# Patient Record
Sex: Male | Born: 1970
Health system: Southern US, Community
[De-identification: ages and names within clinical notes are randomized; demographics above are authoritative.]

## PROBLEM LIST (undated history)

## (undated) DIAGNOSIS — E119 Type 2 diabetes mellitus without complications: Secondary | ICD-10-CM

## (undated) DIAGNOSIS — E785 Hyperlipidemia, unspecified: Secondary | ICD-10-CM

## (undated) DIAGNOSIS — H11003 Unspecified pterygium of eye, bilateral: Secondary | ICD-10-CM

## (undated) DIAGNOSIS — H11009 Unspecified pterygium of unspecified eye: Secondary | ICD-10-CM

## (undated) DIAGNOSIS — I10 Essential (primary) hypertension: Secondary | ICD-10-CM

## (undated) DIAGNOSIS — K219 Gastro-esophageal reflux disease without esophagitis: Secondary | ICD-10-CM

## (undated) HISTORY — PX: BACK SURGERY: SHX140

## (undated) HISTORY — PX: PTERYGIUM EXCISION: SHX2273

## (undated) HISTORY — PX: APPENDECTOMY: SHX54

## (undated) HISTORY — DX: Gastro-esophageal reflux disease without esophagitis: K21.9

## (undated) HISTORY — DX: Type 2 diabetes mellitus without complications: E11.9

## (undated) HISTORY — DX: Hyperlipidemia, unspecified: E78.5

## (undated) HISTORY — DX: Unspecified pterygium of eye, bilateral: H11.003

## (undated) HISTORY — DX: Essential (primary) hypertension: I10

---

## 2004-08-21 HISTORY — PX: EYE SURGERY: SHX253

## 2009-08-30 ENCOUNTER — Emergency Department (HOSPITAL_BASED_OUTPATIENT_CLINIC_OR_DEPARTMENT_OTHER): Admission: EM | Admit: 2009-08-30 | Discharge: 2009-08-30 | Payer: Self-pay | Admitting: Emergency Medicine

## 2009-08-30 ENCOUNTER — Ambulatory Visit: Payer: Self-pay | Admitting: Diagnostic Radiology

## 2012-12-09 ENCOUNTER — Emergency Department (HOSPITAL_COMMUNITY)
Admission: EM | Admit: 2012-12-09 | Discharge: 2012-12-09 | Disposition: A | Discharge status: Home / Self Care | Payer: No Typology Code available for payment source | Source: Home / Self Care

## 2012-12-09 ENCOUNTER — Encounter (HOSPITAL_COMMUNITY): Payer: Self-pay | Admitting: *Deleted

## 2012-12-09 DIAGNOSIS — H11009 Unspecified pterygium of unspecified eye: Secondary | ICD-10-CM

## 2012-12-09 DIAGNOSIS — H11003 Unspecified pterygium of eye, bilateral: Secondary | ICD-10-CM

## 2012-12-09 NOTE — ED Provider Notes (Signed)
History     CSN: [id number]  Arrival date & time 12/09/12  1717   First MD Initiated Contact with Patient 12/09/12 1826      Chief Complaint  Patient presents with  . Establish Care     HPI 42 year old male with no significant past medical history here for symptoms of blurry vision on both his eye mainly on left lateral gaze. He informs occasional pain over his right lateral eyes well. He informs the symptoms to be  ongoing for several months. Informs  having a surgery to his right eye for similar symptoms several years back. He denies any headache, dizziness, chest pain, palpitations or shortness of breath, abdominal pain, nausea, vomiting, bowel or urinary symptoms   History reviewed. No pertinent past medical history.  Past Surgical History  Procedure Laterality Date  . Eye surgery      No family history on file.  History  Substance Use Topics  . Smoking status: Former Smoker    Quit date: 12/10/1998  . Smokeless tobacco: Not on file  . Alcohol Use: No      Review of Systems As outlined in history of present illness Allergies  Review of patient's allergies indicates no known allergies.  Home Medications  No current outpatient prescriptions on file.  BP 128/74  Pulse 67  Temp(Src) 98.6 F (37 C) (Oral)  Resp 14  SpO2 98%  Physical Exam We did aged male in no acute distress HEENT: No pallor, moist oral mucosa Chest: Clear to auscultation bilaterally, no added sounds CVS: Normal S1 and S2, no murmurs or gallop Abdomen: Soft, nontender, nondistended, bowel sounds present Images: Warm, no edema CNS: AAO x3, bilateral pterygium mainly over right lateal encroaching into the right lateral pupil , normal EOMI, pupils equal b/l. Visual acquity normal.  ED Course  Procedures (including critical care time)  Labs Reviewed - No data to display No results found.   1. Pterygium eye, bilateral    Referral to ophthalmology clinic for evaluation and possible  need for surgery. If has worsening pain with visual changes needs to go  to ED immidiately   MDM          Eddie North, MD 12/09/12 [id number]

## 2012-12-09 NOTE — ED Notes (Signed)
Patient presents to establish care; also states he has been having redness in eyes due to allergy; also blurry vision in right eye. States has had eye surgery in the past.

## 2013-05-26 ENCOUNTER — Encounter: Payer: Self-pay | Admitting: Internal Medicine

## 2013-05-26 ENCOUNTER — Ambulatory Visit: Payer: No Typology Code available for payment source | Attending: Internal Medicine

## 2013-05-26 ENCOUNTER — Ambulatory Visit: Payer: No Typology Code available for payment source | Attending: Internal Medicine | Admitting: Internal Medicine

## 2013-05-26 VITALS — BP 119/75 | HR 59 | Temp 98.7°F | Resp 16 | Ht 69.0 in | Wt 208.0 lb

## 2013-05-26 DIAGNOSIS — H11003 Unspecified pterygium of eye, bilateral: Secondary | ICD-10-CM | POA: Insufficient documentation

## 2013-05-26 DIAGNOSIS — H11009 Unspecified pterygium of unspecified eye: Secondary | ICD-10-CM | POA: Insufficient documentation

## 2013-05-26 MED ORDER — NAPHAZOLINE-PHENIRAMINE 0.025-0.3 % OP SOLN: 1.0000 [drp] | Freq: Four times a day (QID) | OPHTHALMIC | Status: DC | PRN

## 2013-05-26 MED ORDER — LOTEPREDNOL ETABONATE 0.5 % OP SUSP: 1.0000 [drp] | Freq: Four times a day (QID) | OPHTHALMIC | Status: DC

## 2013-05-26 NOTE — Patient Instructions (Signed)
Pterygium (Surfer's Eye) and Pinguecula Save This Article For Later  Share this:  Pterygium (pronounced tur-IJ-ee-um) is a common eye condition that affects people who spend a lot of time outdoors. Because it often affects surfers, it is also known as surfer's eye. It can affect anyone, though, even children who don't wear sunglasses outside. People with pterygium have a growth of pink, fleshy tissue on the white of the eye. The growth usually forms on the side closest to the nose and grows toward the center of the eye. Recommended Related to [facility] Cornea Transplant During a cornea transplant, an eye surgeon removes a portion of your cornea and replaces it with a new section of cornea from a donor. The procedure is also called a corneal transplant or a keratoplasty. About 40,000 cornea transplants are performed in the [place] every year. You may need a cornea transplant if your cornea no longer lets light enter your eye properly because of scarring or disease.  Read the Cornea Transplant article > > Pterygium is a noncancerous lesion that usually grows slowly throughout life. It may even stop growing after a certain point. In advanced cases, a pterygium can continue growing until it covers the pupil of the eye and interferes with vision. A pterygium may affect one or both eyes. When it affects both eyes, it is called a bilateral pterygium. Pterygium is usually not a serious condition. It can, though, cause annoying symptoms such as the feeling of having a foreign body in the eye. Sometimes the growth becomes red and irritated and requires medical treatment. Symptoms of Pterygium Sometimes, a pterygium causes no symptoms other than its appearance. An enlarging pterygium, however, may cause redness and inflammation. A pterygium can progressively grow onto the cornea (the clear, outer layer of the eye). This can distort the shape of the cornea, causing a condition called astigmatism. The result can be  blurred vision. Symptoms of pterygium may include: Burning  Gritty feeling  Itching  Sensation of a foreign body in the eye  Blurred vision Causes of Pterygium Most experts believe that significant risk factors include: Prolonged exposure to ultraviolet light  Dry eye  Irritants such as dust and wind Pterygium occurrence is much greater among people who live near the equator. But it also can develop in anyone who lives in a sunny climate. It's most often seen in young adults ages 58 to 67. It appears to be more common in men than in women. Pterygium is often preceded by a related noncancerous condition called pinguecula (pin-WEK-yoo-la). This is a yellowish patch or bump on the conjunctiva near the cornea. The conjunctiva is the thin, moist membrane on the surface of the eye. Pinguecula has the same risk factors as pterygium, especially frequent exposure to the sun without sunglasses. Because pinguecula may prevent tears from coating the surface of your eye well, it can cause dryness and a feeling like something's stuck in your eye. The pinguecula can also become red. Treatment of Pterygium See an ophthalmologist if you have symptoms of pterygium. He or she can diagnose the condition by examining the front part of your eye with a microscope called a slit lamp. Pterygium usually doesn't require treatment if symptoms are mild. If a temporary worsening of the inflamed condition causes redness or irritation, it can be treated with: Lubricating eyedrops or ointments, such as Blink or Refresh drops  Occasional use of vasoconstrictor eyedrops, such as Naphcon A  Short course of steroid eyedrops, such as FML or Lotemax

## 2013-05-26 NOTE — Progress Notes (Signed)
Pt here with 2 weeks with c/o right eye blurriness, dizziness with pain Cataract surgery bilat eyes 2007

## 2013-05-26 NOTE — Progress Notes (Signed)
Patient ID: Ricardo Ward, male   DOB: 03/19/1971, 42 y.o.   MRN: [id number]   CC:  Eye pain, burning, decreased vision.  HPI:  Ricardo Ward is a 42 year old man who was seen in the clinic back in April for complaints of eye pain, burning, and decreased vision. He was referred to an ophthalmologist at that time. He tells me that the ophthalmologist recommended surgical intervention but that he could not afford the $6000 cost. He essentially returns with similar symptoms.  No Known Allergies History reviewed. No pertinent past medical history. No current outpatient prescriptions on file prior to visit.   No current facility-administered medications on file prior to visit.   History reviewed. No pertinent family history. History   Social History  . Marital Status: Married    Spouse Name: N/A    Number of Children: N/A  . Years of Education: N/A   Occupational History  . Not on file.   Social History Main Topics  . Smoking status: Former Smoker    Quit date: 12/10/1998  . Smokeless tobacco: Not on file  . Alcohol Use: No  . Drug Use: No  . Sexual Activity: Not on file   Other Topics Concern  . Not on file   Social History Narrative  . No narrative on file    Review of Systems: Constitutional: No fever, no chills;  Appetite normal; No weight loss. HEENT: + blurry vision, no diplopia, no pharyngitis, no dysphagia CV: No chest pain, no palpitations.  Resp: No SOB, no cough. GI: No N/V, no diarrhea, no melena, no hematochezia.  GU: No dysuria, hematuria, no frequency, no hesitancy.  MSK: no myalgias/arthralgias.  Neuro:  No headache, no focal neurological deficits.  Psych: No depression, no anxiety.  Endo: No heat intolerance, no cold intolerance, no excessive thirst, no excessive urination.  Skin: No rashes, no skin lesions.  Heme: No fatigue, no easy bruising   Objective:   Filed Vitals:   05/26/13 1751  BP: 119/75  Pulse: 59  Temp: 98.7 F (37.1 C)  Resp: 16     Physical Exam  Constitutional: Appears well-developed and well-nourished. No distress.  HENT: Normocephalic. External right and left ear normal. Oropharynx is clear and moist.  Eyes: Conjunctivae and EOM are normal. PERRLA, no scleral icterus.  Neck: Normal ROM. Neck supple. No JVD. No tracheal deviation. No thyromegaly.  CVS: RRR, S1/S2 +, no murmurs, no gallops, no carotid bruit.  Pulmonary: Effort and breath sounds normal, no stridor, rhonchi, wheezes, rales.  Abdominal: Soft. BS +,  no distension, tenderness, rebound or guarding. Musculoskeletal: Normal range of motion. No edema and no tenderness.  Neuro: Alert. Normal reflexes, muscle tone coordination. No cranial nerve deficit. Skin: Skin is warm and dry. No rash noted. Not diaphoretic. No erythema. No pallor.  Psychiatric: Normal mood and affect. Behavior, judgment, thought content normal.   No results found for this basename: WBC,  HGB,  HCT,  MCV,  PLT   No results found for this basename: CREATININE,  BUN,  NA,  K,  CL,  CO2    No results found for this basename: HGBA1C   Lipid Panel  No results found for this basename: chol,  trig,  hdl,  cholhdl,  vldl,  ldlcalc       Assessment and plan:  1. Pterygium both eyes: Patient will be referred to ophthalmology with social work assistance to determine community resources that may help with the cost of surgical repair. In the meantime, I have  given him prescription for Opcon-A and Lotemax for symptomatic relief.   Return to the clinic: As needed.  Signed:  Dr. Trula Ore Jovie Swanner 05/26/2013 6:20 PM

## 2013-06-06 ENCOUNTER — Telehealth: Payer: Self-pay | Admitting: Emergency Medicine

## 2013-06-06 NOTE — Telephone Encounter (Signed)
Joni Reining from [employer] called from [employer] [facility] to clarify orders: Do you want Lotemax gel or solution because order is for sol and forms checked off is gel. Let me know. I can fax over new order

## 2013-06-11 NOTE — Telephone Encounter (Signed)
Either one would be fine, but the solution is probably more comfortable for administration purposes.  Ricardo Ward 06/11/2013 3:07 PM

## 2013-09-05 ENCOUNTER — Ambulatory Visit: Payer: No Typology Code available for payment source | Admitting: Internal Medicine

## 2013-09-09 ENCOUNTER — Ambulatory Visit: Payer: No Typology Code available for payment source | Admitting: Internal Medicine

## 2013-11-04 ENCOUNTER — Encounter (HOSPITAL_COMMUNITY): Payer: Self-pay | Admitting: Pharmacy Technician

## 2013-11-07 ENCOUNTER — Other Ambulatory Visit: Payer: Self-pay | Admitting: Ophthalmology

## 2013-11-07 MED ORDER — TETRACAINE HCL 0.5 % OP SOLN: 1.0000 [drp] | OPHTHALMIC | Status: DC

## 2013-11-07 MED ORDER — MITOMYCIN 0.2 MG OP KIT: 0.2000 mg | PACK | Freq: Once | OPHTHALMIC | Status: DC

## 2013-11-07 NOTE — H&P (Deleted)
History & Physical:  DATE: 10-28-13  NAME: Ricardo Ward, Ricardo Ward [id number]  HISTORY OF PRESENT ILLNESS:  Chief Eye Complaints tearing blurry vision  HPI: EYES: Reports symptoms of  growth on eyes  LOCATION: RIGHT EYE  QUALITY/COURSE: Reports condition is worsening.  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 Related Dxs- Order Due: Consultant: Modifiers-  Pick List - Surgeries  MEDICATIONS:  Zofran: Strength- SIG-  REVIEW OF SYSTEMS:  ROS: GEN- Constitutional: Negative general-constitutional systems review.  HENT:  GEN - Endocrine: Reports symptoms of  LUNGS/Respiratory:  HEART/Cardiovascular: Reports symptoms of  ABD/Gastrointestinal:  Musculoskeletal (BJE):  NEURO/Neurological:  PSYCH/Psychiatric: Is the pt oriented to time, place, person? yes  Mood depressed __ normal agitated __  TOBACCO: Never smoker ICD#V13.89 Onset: 10/28/2013 16:11  SOCIAL HISTORY: noncontributory  FAMILY HISTORY:  Family History - 1st Degree Relatives: Son alive and well.  Family History - 1st Degree Relatives:  ALLERGIES:  Drug Allergies. No Known.  Starter - Allergies - Summary:  PHYSICAL EXAMINATION:  VS: BMI: 31.2. BP: 140/75. H: 68.00 in. P: 71 /min. RR: 14 /min. W: 205lbs 0oz.  Va OD [place] 20/60 phni cc 20/  OS [place] 20/20 cc 20/  EYEGLASSES: OD OS  ADD:  MR OD - 2.25+ 1.75 x 078 20/25-  OS -0.50 sph 20/20  K's  OD 37.75/46.25  OS 39.75/44.25  ADD  VF: OD full to confrontation testing OS full to confrontation testing  Motility orthophoria and full  PUPILS: 4 mm round reactive negative Marcus Gunn  EYELIDS & OCULAR ADNEXA normal  SLE:  Conjunctiva +2 injection  OD, plus one injection, OS  Cornea temporal pterygium extending to pupil OD, nasal and temporal pterygium was OS  anterior chamber deep and quiet each eye  Iris Brown each eye  Lens Clear each eye  Vitreous  CCT  Ta in mmHg OD 20 OS 20  Time 10/28/2013 15:32  Gonio  Dilation phenylephrine 2.5%  Fundus:  optic nerve OD 35% cup  OS 40% cup  Macula OD normal OS normal  Vessels normal  Periphery normal  Exam: GENERAL: Appearance:  HEAD, EARS, NOSE AND THROAT: Ears-Nose (external) Inspection: Externally, nose and ears are normal in appearance and without scars, lesions, or nodules.  Hearing assessment shows no problems with normal conversation.  LUNGS and RESPIRATORY: Lung auscultation elicits no wheezing, rhonci, rales or rubs and with equal breath sounds.  Respiratory effort described as breathing is unlabored and chest movement is symmetrical.  HEART (Cardiovascular): Heart auscultation discovers regular rate and rhythm; no murmur, gallop or rub. Normal heart sounds.  ABDOMEN (Gastrointestinal): Mass/Tenderness Exam: Neither are present.  MUSCULOSKELETAL (BJE): Inspection-Palpation: No major bone, joint, tendon, or muscle changes.  NEUROLOGICAL: Alert and oriented. No major deficits of coordination or sensation.  PSYCHIATRIC: Insight and judgment appear both to be intact and appropriate.  Mood and affect are described as normal mood and full affect.  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  Actions:  Handouts: .   ___________________________  Jelan Batterton, Jr.  Starter - Inactive Problems:   

## 2013-11-10 ENCOUNTER — Encounter (HOSPITAL_COMMUNITY)
Admission: RE | Admit: 2013-11-10 | Discharge: 2013-11-10 | Disposition: A | Discharge status: Home / Self Care | Payer: No Typology Code available for payment source | Source: Ambulatory Visit | Attending: Ophthalmology | Admitting: Ophthalmology

## 2013-11-10 ENCOUNTER — Encounter (HOSPITAL_COMMUNITY): Payer: Self-pay

## 2013-11-10 DIAGNOSIS — Z01812 Encounter for preprocedural laboratory examination: Secondary | ICD-10-CM | POA: Insufficient documentation

## 2013-11-10 HISTORY — DX: Unspecified pterygium of unspecified eye: H11.009

## 2013-11-10 LAB — CBC
HCT: 32.8 % — ABNORMAL LOW (ref 39.0–52.0)
Hemoglobin: 10.5 g/dL — ABNORMAL LOW (ref 13.0–17.0)
MCH: 23 pg — ABNORMAL LOW (ref 26.0–34.0)
MCHC: 32 g/dL (ref 30.0–36.0)
MCV: 71.8 fL — ABNORMAL LOW (ref 78.0–100.0)
Platelets: 380 10*3/uL (ref 150–400)
RBC: 4.57 MIL/uL (ref 4.22–5.81)
RDW: 17.4 % — AB (ref 11.5–15.5)
WBC: 6.4 10*3/uL (ref 4.0–10.5)

## 2013-11-10 NOTE — Pre-Procedure Instructions (Addendum)
Ricardo Ward  11/10/2013   Your procedure is scheduled on:  11/12/13  Report to [facility] short stay admitting at 900 AM.  Call this number if you have problems the morning of surgery: [phone]   Remember:   Do not eat food or drink liquids after midnight.   Take these medicines the morning of surgery with A SIP OF WATER: none             STOP all herbel meds, nsaids (aleve,naproxen,advil,ibuprofen) now including vitamins, aspirin   Do not wear jewelry, make-up or nail polish.  Do not wear lotions, powders, or perfumes. You may wear deodorant.  Do not shave 48 hours prior to surgery. Men may shave face and neck.  Do not bring valuables to the hospital.  [facility] is not responsible                  for any belongings or valuables.               Contacts, dentures or bridgework may not be worn into surgery.  Leave suitcase in the car. After surgery it may be brought to your room.  For patients admitted to the hospital, discharge time is determined by your                treatment team.               Patients discharged the day of surgery will not be allowed to drive  home.  Name and phone number of your driver:   Special Instructions:  Special Instructions: Ricardo Ward - Preparing for Surgery  Before surgery, you can play an important role.  Because skin is not sterile, your skin needs to be as free of germs as possible.  You can reduce the number of germs on you skin by washing with CHG (chlorahexidine gluconate) soap before surgery.  CHG is an antiseptic cleaner which kills germs and bonds with the skin to continue killing germs even after washing.  Please DO NOT use if you have an allergy to CHG or antibacterial soaps.  If your skin becomes reddened/irritated stop using the CHG and inform your nurse when you arrive at Short Stay.  Do not shave (including legs and underarms) for at least 48 hours prior to the first CHG shower.  You may shave your face.  Please follow these  instructions carefully:   1.  Shower with CHG Soap the night before surgery and the morning of Surgery.  2.  If you choose to wash your hair, wash your hair first as usual with your normal shampoo.  3.  After you shampoo, rinse your hair and body thoroughly to remove the Shampoo.  4.  Use CHG as you would any other liquid soap.  You can apply chg directly  to the skin and wash gently with scrungie or a clean washcloth.  5.  Apply the CHG Soap to your body ONLY FROM THE NECK DOWN.  Do not use on open wounds or open sores.  Avoid contact with your eyes ears, mouth and genitals (private parts).  Wash genitals (private parts)       with your normal soap.  6.  Wash thoroughly, paying special attention to the area where your surgery will be performed.  7.  Thoroughly rinse your body with warm water from the neck down.  8.  DO NOT shower/wash with your normal soap after using and rinsing off the CHG Soap.  9.  Pat yourself dry with a clean towel.            10.  Wear clean pajamas.            11.  Place clean sheets on your bed the night of your first shower and do not sleep with pets.  Day of Surgery  Do not apply any lotions/deodorants the morning of surgery.  Please wear clean clothes to the hospital/surgery center.   Please read over the following fact sheets that you were given: Pain Booklet, Coughing and Deep Breathing and Surgical Site Infection Prevention

## 2013-11-11 MED ORDER — GATIFLOXACIN 0.5 % OP SOLN: 1.0000 [drp] | OPHTHALMIC | Status: AC

## 2013-11-12 ENCOUNTER — Encounter (HOSPITAL_COMMUNITY)
Admission: RE | Disposition: A | Discharge status: Home / Self Care | Payer: Self-pay | Source: Ambulatory Visit | Attending: Ophthalmology

## 2013-11-12 ENCOUNTER — Ambulatory Visit (HOSPITAL_COMMUNITY): Payer: BC Managed Care – PPO | Admitting: Anesthesiology

## 2013-11-12 ENCOUNTER — Ambulatory Visit (HOSPITAL_COMMUNITY)
Admission: RE | Admit: 2013-11-12 | Discharge: 2013-11-12 | Disposition: A | Discharge status: Home / Self Care | Payer: BC Managed Care – PPO | Source: Ambulatory Visit | Attending: Ophthalmology | Admitting: Ophthalmology

## 2013-11-12 ENCOUNTER — Encounter (HOSPITAL_COMMUNITY): Payer: Self-pay | Admitting: *Deleted

## 2013-11-12 ENCOUNTER — Encounter (HOSPITAL_COMMUNITY): Payer: BC Managed Care – PPO | Admitting: Anesthesiology

## 2013-11-12 DIAGNOSIS — Z87891 Personal history of nicotine dependence: Secondary | ICD-10-CM | POA: Insufficient documentation

## 2013-11-12 DIAGNOSIS — H11069 Recurrent pterygium of unspecified eye: Secondary | ICD-10-CM | POA: Insufficient documentation

## 2013-11-12 HISTORY — PX: PTERYGIUM EXCISION: SHX2273

## 2013-11-12 SURGERY — EXCISION, PTERYGIUM
Anesthesia: Monitor Anesthesia Care | Site: Eye | Laterality: Right

## 2013-11-12 MED ORDER — MITOMYCIN 0.2 MG OP KIT
PACK | OPHTHALMIC | Status: DC | PRN
  Administered 2013-11-12: .4 mg via OPHTHALMIC

## 2013-11-12 MED ORDER — 0.9 % SODIUM CHLORIDE (POUR BTL) OPTIME
TOPICAL | Status: DC | PRN
  Administered 2013-11-12: 200 mL

## 2013-11-12 MED ORDER — GATIFLOXACIN 0.5 % OP SOLN
1.0000 [drp] | OPHTHALMIC | Status: AC
  Administered 2013-11-12 (×3): 1 [drp] via OPHTHALMIC
  Filled 2013-11-12: qty 2.5

## 2013-11-12 MED ORDER — EVICEL 2 ML EX KIT
PACK | CUTANEOUS | Status: AC
  Filled 2013-11-12: qty 1

## 2013-11-12 MED ORDER — EPHEDRINE SULFATE 50 MG/ML IJ SOLN
INTRAMUSCULAR | Status: DC | PRN
  Administered 2013-11-12: 10 mg via INTRAVENOUS
  Administered 2013-11-12: 5 mg via INTRAVENOUS

## 2013-11-12 MED ORDER — BSS IO SOLN
INTRAOCULAR | Status: DC | PRN
  Administered 2013-11-12: 500 mL via INTRAOCULAR

## 2013-11-12 MED ORDER — EVICEL 2 ML EX KIT
PACK | CUTANEOUS | Status: DC | PRN
  Administered 2013-11-12: 2 mL via TOPICAL

## 2013-11-12 MED ORDER — NEOMYCIN-POLYMYXIN-DEXAMETH 3.5-10000-0.1 OP OINT
TOPICAL_OINTMENT | OPHTHALMIC | Status: AC
  Filled 2013-11-12: qty 3.5

## 2013-11-12 MED ORDER — LIDOCAINE-EPINEPHRINE 2 %-1:100000 IJ SOLN
INTRAMUSCULAR | Status: DC | PRN
  Administered 2013-11-12: 12:00:00 via RETROBULBAR

## 2013-11-12 MED ORDER — ONDANSETRON HCL 4 MG/2ML IJ SOLN
INTRAMUSCULAR | Status: AC
  Filled 2013-11-12: qty 2

## 2013-11-12 MED ORDER — BSS IO SOLN
INTRAOCULAR | Status: AC
  Filled 2013-11-12: qty 500

## 2013-11-12 MED ORDER — FENTANYL CITRATE 0.05 MG/ML IJ SOLN
INTRAMUSCULAR | Status: AC
  Filled 2013-11-12: qty 5

## 2013-11-12 MED ORDER — MIDAZOLAM HCL 2 MG/2ML IJ SOLN
INTRAMUSCULAR | Status: AC
  Filled 2013-11-12: qty 2

## 2013-11-12 MED ORDER — ONDANSETRON HCL 4 MG/2ML IJ SOLN
INTRAMUSCULAR | Status: DC | PRN
  Administered 2013-11-12: 4 mg via INTRAVENOUS

## 2013-11-12 MED ORDER — BSS IO SOLN
INTRAOCULAR | Status: DC | PRN
  Administered 2013-11-12: 15 mL via INTRAOCULAR

## 2013-11-12 MED ORDER — LIDOCAINE HCL 2 % IJ SOLN
INTRAMUSCULAR | Status: AC
  Filled 2013-11-12: qty 20

## 2013-11-12 MED ORDER — NEOMYCIN-POLYMYXIN-DEXAMETH 0.1 % OP OINT
TOPICAL_OINTMENT | OPHTHALMIC | Status: DC | PRN
  Administered 2013-11-12: 1 via OPHTHALMIC

## 2013-11-12 MED ORDER — BUPIVACAINE HCL (PF) 0.75 % IJ SOLN
INTRAMUSCULAR | Status: AC
  Filled 2013-11-12: qty 10

## 2013-11-12 MED ORDER — PROPOFOL 10 MG/ML IV BOLUS
INTRAVENOUS | Status: AC
  Filled 2013-11-12: qty 20

## 2013-11-12 MED ORDER — MIDAZOLAM HCL 5 MG/5ML IJ SOLN
INTRAMUSCULAR | Status: DC | PRN
  Administered 2013-11-12 (×2): 1 mg via INTRAVENOUS

## 2013-11-12 MED ORDER — TETRACAINE HCL 0.5 % OP SOLN
OPHTHALMIC | Status: AC
  Filled 2013-11-12: qty 2

## 2013-11-12 MED ORDER — ATROPINE SULFATE 1 % OP SOLN
OPHTHALMIC | Status: AC
  Filled 2013-11-12: qty 2

## 2013-11-12 MED ORDER — HYALURONIDASE HUMAN 150 UNIT/ML IJ SOLN
INTRAMUSCULAR | Status: AC
Start: 2013-11-12 — End: 2013-11-12
  Filled 2013-11-12: qty 1

## 2013-11-12 MED ORDER — TETRACAINE HCL 0.5 % OP SOLN
OPHTHALMIC | Status: DC | PRN
  Administered 2013-11-12: 2 [drp] via OPHTHALMIC

## 2013-11-12 MED ORDER — PROPOFOL INFUSION 10 MG/ML OPTIME
INTRAVENOUS | Status: DC | PRN
  Administered 2013-11-12: 25 ug/kg/min via INTRAVENOUS

## 2013-11-12 MED ORDER — BSS IO SOLN
INTRAOCULAR | Status: AC
  Filled 2013-11-12: qty 15

## 2013-11-12 MED ORDER — SODIUM CHLORIDE 0.9 % IV SOLN
INTRAVENOUS | Status: DC
  Administered 2013-11-12: 11:00:00 via INTRAVENOUS

## 2013-11-12 MED ORDER — PROPOFOL 10 MG/ML IV BOLUS
INTRAVENOUS | Status: DC | PRN
  Administered 2013-11-12: 30 mg via INTRAVENOUS
  Administered 2013-11-12: 40 mg via INTRAVENOUS

## 2013-11-12 MED ORDER — LIDOCAINE-EPINEPHRINE 2 %-1:100000 IJ SOLN
INTRAMUSCULAR | Status: AC
  Filled 2013-11-12: qty 1

## 2013-11-12 MED ORDER — ARTIFICIAL TEARS OP OINT
TOPICAL_OINTMENT | OPHTHALMIC | Status: AC
  Filled 2013-11-12: qty 3.5

## 2013-11-12 MED ORDER — FENTANYL CITRATE 0.05 MG/ML IJ SOLN
INTRAMUSCULAR | Status: DC | PRN
  Administered 2013-11-12: 50 ug via INTRAVENOUS

## 2013-11-12 MED ORDER — STERILE WATER FOR IRRIGATION IR SOLN
Status: DC | PRN
  Administered 2013-11-12: 100 mL

## 2013-11-12 MED ORDER — LIDOCAINE HCL (CARDIAC) 20 MG/ML IV SOLN
INTRAVENOUS | Status: AC
  Filled 2013-11-12: qty 5

## 2013-11-12 SURGICAL SUPPLY — 49 items
AMNIOGRAFT 2.5X2.0 (Orthopedic Implant) ×3 IMPLANT
APPLICATOR COTTON TIP 6IN STRL (MISCELLANEOUS) ×3 IMPLANT
BLADE MINI RND TIP GREEN BEAV (BLADE) ×3 IMPLANT
BLADE SURG 15 STRL LF DISP TIS (BLADE) IMPLANT
BLADE SURG 15 STRL SS (BLADE)
CANISTER SUCTION 2500CC (MISCELLANEOUS) ×3 IMPLANT
CLOSURE WOUND 1/4X4 (GAUZE/BANDAGES/DRESSINGS) ×1
CONT SPEC 4OZ CLIKSEAL STRL BL (MISCELLANEOUS) ×3 IMPLANT
CORDS BIPOLAR (ELECTRODE) ×3 IMPLANT
COVER TRANSDUCER ULTRASND (DRAPES) ×3 IMPLANT
DRAPE OPHTHALMIC 40X48 W POUCH (DRAPES) ×3 IMPLANT
DRAPE RETRACTOR (MISCELLANEOUS) ×3 IMPLANT
ERASER HMR WETFIELD 23G BP (MISCELLANEOUS) ×3 IMPLANT
GLOVE BIO SURGEON STRL SZ7.5 (GLOVE) ×3 IMPLANT
GLOVE BIO SURGEON STRL SZ8 (GLOVE) ×3 IMPLANT
GLOVE BIOGEL PI IND STRL 7.0 (GLOVE) ×1 IMPLANT
GLOVE BIOGEL PI IND STRL 8 (GLOVE) ×1 IMPLANT
GLOVE BIOGEL PI INDICATOR 7.0 (GLOVE) ×2
GLOVE BIOGEL PI INDICATOR 8 (GLOVE) ×2
GLOVE ECLIPSE 7.0 STRL STRAW (GLOVE) ×3 IMPLANT
GLOVE SURG SS PI 7.0 STRL IVOR (GLOVE) ×6 IMPLANT
GLOVE SURG SS PI 7.5 STRL IVOR (GLOVE) ×3 IMPLANT
GOWN EXTRA PROTECTION XL (GOWNS) ×3 IMPLANT
GOWN STRL REIN XL XLG (GOWN DISPOSABLE) ×6 IMPLANT
GOWN STRL REUS W/ TWL LRG LVL3 (GOWN DISPOSABLE) IMPLANT
GOWN STRL REUS W/TWL LRG LVL3 (GOWN DISPOSABLE)
KIT BASIN OR (CUSTOM PROCEDURE TRAY) ×3 IMPLANT
KIT ROOM TURNOVER OR (KITS) ×3 IMPLANT
KNIFE CRESCENT 2.5 55 ANG (BLADE) ×3 IMPLANT
MASK EYE SHIELD (GAUZE/BANDAGES/DRESSINGS) ×3 IMPLANT
NEEDLE HYPO 25GX1X1/2 BEV (NEEDLE) ×3 IMPLANT
NEEDLE HYPO 30X.5 LL (NEEDLE) ×3 IMPLANT
NS IRRIG 1000ML POUR BTL (IV SOLUTION) ×3 IMPLANT
PACK CATARACT CUSTOM (CUSTOM PROCEDURE TRAY) ×3 IMPLANT
PAD ARMBOARD 7.5X6 YLW CONV (MISCELLANEOUS) ×3 IMPLANT
PAD EYE OVAL STERILE LF (GAUZE/BANDAGES/DRESSINGS) ×3 IMPLANT
STRIP CLOSURE SKIN 1/4X4 (GAUZE/BANDAGES/DRESSINGS) ×2 IMPLANT
SUT SILK 4 0 C 3 735G (SUTURE) IMPLANT
SUT SILK 6 0 G 6 (SUTURE) ×3 IMPLANT
SUT VICRYL 8 0 TG140 8 (SUTURE) IMPLANT
SUT VICRYL 9-0 (SUTURE) ×3 IMPLANT
SYR BULB 3OZ (MISCELLANEOUS) IMPLANT
TAPE SURG TRANSPORE 1 IN (GAUZE/BANDAGES/DRESSINGS) ×1 IMPLANT
TAPE SURGICAL TRANSPORE 1 IN (GAUZE/BANDAGES/DRESSINGS) ×2
TOWEL OR 17X24 6PK STRL BLUE (TOWEL DISPOSABLE) ×3 IMPLANT
TUBE CONNECTING 12'X1/4 (SUCTIONS) ×1
TUBE CONNECTING 12X1/4 (SUCTIONS) ×2 IMPLANT
WATER STERILE IRR 1000ML POUR (IV SOLUTION) ×3 IMPLANT
WIPE INSTRUMENT VISIWIPE 73X73 (MISCELLANEOUS) ×3 IMPLANT

## 2013-11-12 NOTE — Op Note (Signed)
Preoperative diagnosis: Recurrent pterygium right eye Postoperative diagnosis: Same Procedure: Excision of pterygium with amniotic membrane graft with mitomycin-C and Evicel glue Consultations: None Anesthesia: Topical tetracaine and topical Xylocaine Marcaine 50-50 mixture Procedure: The topical anesthetics were applied to the eye at the beginning of the case and throughout the case. The patient's face was then prepped and draped in the usual sterile fashion. With the surgeon sitting superiorly it was noted that there was a pterygium that extended to the pupil on the cornea the pterygium extended 8 mm posterior to the temporal limbus and was 8 mm wide at the base. The pterygium was marked. Following this using a 0.12 and a sharp Wescott scissors an incision was made at the head of the pterygium then a 69 blade was used to dissect the pterygium from the cornea surface dissecting toward the limbus and gently pulling the pterygium until it extended past the limbus the shot Wescott scissors were then used to excise the pterygium at the base completely from the eye and it was submitted as a specimen. Following this the 69 blade was then used to remove epithelial cells from the cornea and gentle dissection with the Weck-Cel sponge area the dental bur was then used to remove additional cells from the cornea. Following this mitomycin-C 0.4 mg per cc were placed on a presoaked Gelfoam sponge and allowed to stay under the conjunctiva surface for a period of 1 minute. All in this sponges were removed and the eye was irrigated with 30 cc of balanced salt solution bleeding was controlled with cautery. The amniotic membrane was then taken from the package the membrane was number a [id number] the membrane was cut to cover the defect where the conjunctiva had been removed the membrane was then peeled from the paper and then laid on the cornea surface. It was then sutured to the limbus with 2 interrupted 9-0 Vicryl sutures on a BV  100 needle. All in this the glue was then placed on the exposed scleral bed allowing part of the glue to egressed under the conjunctiva. The amniotic membrane was then carefully laid over the scleral defect and using a muscle hook to push the membrane under the edges of the conjunctiva and to smooth out the glue. Following this the excess glue was excised the amniotic membrane was laying smoothly on the surface of the sclera and attached to the conjunctiva. Therefore all instruments removed from the eye including a 6-0 nylon suture which had been used the fixation through clear cornea. Following this topical Maxitrol ointment was applied to the eye a patch and Fox U. were placed and the patient returned to recovery area in stable condition Ricardo Ward M.D.

## 2013-11-12 NOTE — H&P (Signed)
Patient labs reviewed no changes to history & physical.  All patient questions ave been answered.

## 2013-11-12 NOTE — Anesthesia Procedure Notes (Signed)
Procedure Name: MAC Date/Time: 11/12/2013 11:53 AM Performed by: Barrington Ellison Pre-anesthesia Checklist: Patient identified, Emergency Drugs available, Suction available, Patient being monitored and Timeout performed Patient Re-evaluated:Patient Re-evaluated prior to inductionOxygen Delivery Method: Nasal cannula Placement Confirmation: positive ETCO2

## 2013-11-12 NOTE — Anesthesia Postprocedure Evaluation (Signed)
  Anesthesia Post-op Note  Patient: Agricultural engineer  Procedure(s) Performed: Procedure(s): PTERYGIUM EXCISION WITH MITOMYCIN C AND AMNIOTIC MEMBRANE USING TISSEEL GLUE RIGHT EYE (Right)  Patient Location: PACU  Anesthesia Type:MAC  Level of Consciousness: awake, alert  and oriented  Airway and Oxygen Therapy: Patient Spontanous Breathing and Patient connected to nasal cannula oxygen  Post-op Pain: none  Post-op Assessment: Post-op Vital signs reviewed, Patient's Cardiovascular Status Stable, Respiratory Function Stable, Patent Airway and No signs of Nausea or vomiting  Post-op Vital Signs: Reviewed and stable  Complications: No apparent anesthesia complications

## 2013-11-12 NOTE — Progress Notes (Signed)
When pt arrive there was not a spanish interpretor present. I left message for social worker that one was needed. I used the interpretor phone line. Ricardo Ward,  from office of inclusion, returned my call and stated she will send an interpretor.

## 2013-11-12 NOTE — Preoperative (Signed)
Beta Blockers   Reason not to administer Beta Blockers:Not Applicable 

## 2013-11-12 NOTE — Anesthesia Preprocedure Evaluation (Addendum)
Anesthesia Evaluation  Patient identified by MRN, date of birth, ID band Patient awake    Reviewed: Allergy & Precautions, H&P , NPO status , Patient's Chart, lab work & pertinent test results  Airway Mallampati: II TM Distance: >3 FB Neck ROM: Full    Dental  (+) Teeth Intact, Dental Advisory Given   Pulmonary former smoker,  breath sounds clear to auscultation        Cardiovascular negative cardio ROS  Rhythm:Regular Rate:Normal     Neuro/Psych    GI/Hepatic negative GI ROS, Neg liver ROS,   Endo/Other    Renal/GU negative Renal ROS     Musculoskeletal   Abdominal   Peds  Hematology   Anesthesia Other Findings   Reproductive/Obstetrics                          Anesthesia Physical Anesthesia Plan  ASA: II  Anesthesia Plan: MAC   Post-op Pain Management:    Induction: Intravenous  Airway Management Planned:   Additional Equipment:   Intra-op Plan:   Post-operative Plan:   Informed Consent: I have reviewed the patients History and Physical, chart, labs and discussed the procedure including the risks, benefits and alternatives for the proposed anesthesia with the patient or authorized representative who has indicated his/her understanding and acceptance.   Dental advisory given  Plan Discussed with: CRNA and Anesthesiologist  Anesthesia Plan Comments:         Anesthesia Quick Evaluation

## 2013-11-12 NOTE — Transfer of Care (Signed)
Immediate Anesthesia Transfer of Care Note  Patient: Ricardo Ward  Procedure(s) Performed: Procedure(s): PTERYGIUM EXCISION WITH MITOMYCIN C AND AMNIOTIC MEMBRANE USING TISSEEL GLUE RIGHT EYE (Right)  Patient Location: PACU  Anesthesia Type:MAC  Level of Consciousness: awake, alert  and oriented  Airway & Oxygen Therapy: Patient Spontanous Breathing and Patient connected to nasal cannula oxygen  Post-op Assessment: Report given to PACU RN and Post -op Vital signs reviewed and stable  Post vital signs: Reviewed and stable  Complications: No apparent anesthesia complications

## 2013-11-12 NOTE — Discharge Instructions (Signed)
Patient may remove the eye patch from his eye at 3:00 this afternoon. He should avoid rubbing the eye and did not sleep on the eye had the surgery.

## 2013-11-14 ENCOUNTER — Encounter (HOSPITAL_COMMUNITY): Payer: Self-pay | Admitting: Ophthalmology

## 2014-02-11 ENCOUNTER — Other Ambulatory Visit: Payer: Self-pay | Admitting: Ophthalmology

## 2014-02-11 NOTE — H&P (Signed)
History & Physical:  DATE: 10-28-13  NAME: Ricardo Ward, Ricardo Ward [id number]  HISTORY OF PRESENT ILLNESS:  Chief Eye Complaints tearing blurry vision  HPI: EYES: Reports symptoms of  growth on eyes  LOCATION: RIGHT EYE  QUALITY/COURSE: Reports condition is worsening.  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 Related Dxs- Order Due: Consultant: Modifiers-  Pick List - Surgeries  MEDICATIONS:  Zofran: Strength- SIG-  REVIEW OF SYSTEMS:  ROS: GEN- Constitutional: Negative general-constitutional systems review.  HENT:  GEN - Endocrine: Reports symptoms of  LUNGS/Respiratory:  HEART/Cardiovascular: Reports symptoms of  ABD/Gastrointestinal:  Musculoskeletal (BJE):  NEURO/Neurological:  PSYCH/Psychiatric: Is the pt oriented to time, place, person? yes  Mood depressed __ normal agitated __  TOBACCO: Never smoker ICD#V13.89 Onset: 10/28/2013 16:11  SOCIAL HISTORY: noncontributory  FAMILY HISTORY:  Family History - 1st Degree Relatives: Son alive and well.  Family History - 1st Degree Relatives:  ALLERGIES:  Drug Allergies. No Known.  Starter - Allergies - Summary:  PHYSICAL EXAMINATION:  VS: BMI: 31.2. BP: 140/75. H: 68.00 in. P: 71 /min. RR: 14 /min. W: 205lbs 0oz.  Va OD [place] 20/60 phni cc 20/  OS  20/20 cc 20/  EYEGLASSES: OD OS  ADD:  MR OD - 2.25+ 1.75 x 078 20/25-  OS -0.50 sph 20/20  K's  OD 37.75/46.25  OS 39.75/44.25  ADD  VF: OD full to confrontation testing OS full to confrontation testing  Motility orthophoria and full  PUPILS: 4 mm round reactive negative Marcus Gunn  EYELIDS & OCULAR ADNEXA normal  SLE:  Conjunctiva +2 injection  OD, plus one injection, OS  Cornea temporal pterygium extending to pupil OD, nasal and temporal pterygium was OS  anterior chamber deep and quiet each eye  Iris Brown each eye  Lens Clear each eye  Vitreous  CCT  Ta in mmHg OD 20 OS 20  Time 10/28/2013 15:32  Gonio  Dilation phenylephrine 2.5%  Fundus:  optic nerve OD 35% cup  OS 40% cup  Macula OD normal OS normal  Vessels normal  Periphery normal  Exam: GENERAL: Appearance:  HEAD, EARS, NOSE AND THROAT: Ears-Nose (external) Inspection: Externally, nose and ears are normal in appearance and without scars, lesions, or nodules.  Hearing assessment shows no problems with normal conversation.  LUNGS and RESPIRATORY: Lung auscultation elicits no wheezing, rhonci, rales or rubs and with equal breath sounds.  Respiratory effort described as breathing is unlabored and chest movement is symmetrical.  HEART (Cardiovascular): Heart auscultation discovers regular rate and rhythm; no murmur, gallop or rub. Normal heart sounds.  ABDOMEN (Gastrointestinal): Mass/Tenderness Exam: Neither are present.  MUSCULOSKELETAL (BJE): Inspection-Palpation: No major bone, joint, tendon, or muscle changes.  NEUROLOGICAL: Alert and oriented. No major deficits of coordination or sensation.  PSYCHIATRIC: Insight and judgment appear both to be intact and appropriate.  Mood and affect are described as normal mood and full affect.  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  Actions:  Handouts: .   ___________________________  Marylynn Pearson, Lauris Chroman - Inactive Problems:

## 2014-09-13 ENCOUNTER — Other Ambulatory Visit: Payer: Self-pay | Admitting: Ophthalmology

## 2014-09-13 MED ORDER — MITOMYCIN-C INJECTION USE IN OR ONLY (0.4 MG/ML): 0.5000 mL | Freq: Once | INTRAVENOUS | Status: DC

## 2014-09-13 MED ORDER — TETRACAINE HCL 0.5 % OP SOLN: 1.0000 [drp] | OPHTHALMIC | Status: AC

## 2014-09-13 NOTE — H&P (Signed)
History & Physical:   DATE:   08-28-14  NAME:  Ricardo Ward, Ricardo Ward     [id number]       HISTORY OF PRESENT ILLNESS: Chief Eye Complaints  Pterygium OS. Patient c/o having a slight pain and itch in OD about three weeks ago. Patient c/o OD being red.  Patient c/o OS watering. Patient has not used in over 3 months due to running out. Patient c/o va being blurry OS  HPI: EYES: Reports symptoms of red eyes .TEARING    OU       LOCATION:   LEFT EYE        QUALITY/COURSE:   Reports condition is worsening.        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 Related Dxs-  Order Due:  Consultant:  Modifiers-    Pick List - Surgeries  MEDICATIONS: Zofran: Strength-  SIG-    Lotemax: 0.5% gel SIG-  OD BID    Timoptic Ocudose: 0.5% solution SIG-  1 gtt in each affected eye 2 times a day for 30 days   Alrex: 0.2% suspension SIG-  1 gtt in each affected eye 4 times a day for 7 days   Pazeo: 0.7% solution SIG-  1 gtt in each affected eye once a day  REVIEW OF SYSTEMS: ROS:   GEN- Constitutional: Negative general-constitutional systems review.      HENT: GEN - Endocrine: Reports symptoms of LUNGS/Respiratory:  HEART/Cardiovascular: Reports symptoms of ABD/Gastrointestinal:   Musculoskeletal (BJE): NEURO/Neurological: PSYCH/Psychiatric:    Is the pt oriented to time, place, person? yes  Mood depressed __ normal   agitated __   TOBACCO: Never smoker   ICD10: Z87.898 ICD9: V13.89 Onset: 10/28/2013 16:11    Tobacco use:     Tobacco cessation:  SOCIAL HISTORY: Automotive engineer List - Social History  FAMILY HISTORY: Positive family history for  -   NONE Negative family history for  -   PARENTS: CHILDREN: GRANDPARENTS: SIBLINGS: UNCLES/AUNTS: OTHERS/DISTANT:    Family History - 1st Degree Relatives:  Son alive and well.   Family History - 1st Degree Relatives:  ALLERGIES: Drug Allergies.  No Known.   Starter - Allergies - Summary:  PHYSICAL EXAMINATION: VS: BMI: 31.2.  BP: 140/75.  H: 68.00 in.  P: 71 /min.  RR: 14 /min.  W: 205lbs 0oz.    Va    OD [place] 20/60   phni         cc 20/          OS [place] 20/20         cc 20/  EYEGLASSES: OD  OS                ADD:  MR  OD  - 2.25+ 1.75 x 078    20/25-        OS   -0.50   sph           20/20  K's OD    37.75/46.25 OS      39.75/44.25     ADD  VF:  OD full to confrontation testing           OS full to confrontation testing  Motility orthophoria and full  PUPILS:  4 mm round reactive negative Marcus Gunn  EYELIDS & OCULAR ADNEXA  normal  SLE: Conjunctiva +2 injection OD, plus one injection, OS  Cornea temporal pterygium extending to pupil OD, nasal and temporal pterygium was OS   anterior chamber  deep and quiet each eye  Iris Brown each eye  Lens Clear each eye  Vitreous  CCT  Ta   in mmHg    OD  20          OS 20 Time 10/28/2013 15:32   Gonio   Dilation phenylephrine 2.5%  Fundus:  optic nerve  OD     35% cup                                                                OS   40% cup   Macula      OD   normal                                              OS  normal    Vessels  normal    Periphery  normal       Exam: GENERAL: Appearance: HEAD, EARS, NOSE AND THROAT: Ears-Nose (external) Inspection: Externally, nose and ears are normal in appearance and without  scars, lesions, or nodules.      Hearing assessment shows no problems with normal conversation.      LUNGS and RESPIRATORY: Lung auscultation elicits no wheezing, rhonci, rales or rubs and with equal breath sounds.    Respiratory effort described as breathing is unlabored and chest movement is symmetrical.    HEART (Cardiovascular): Heart auscultation discovers regular rate and rhythm; no murmur, gallop or rub. Normal heart sounds.    ABDOMEN (Gastrointestinal): Mass/Tenderness Exam: Neither are present.     MUSCULOSKELETAL (BJE): Inspection-Palpation: No major bone, joint, tendon, or muscle changes.      NEUROLOGICAL: Alert and oriented. No major deficits of coordination or sensation.      PSYCHIATRIC: Insight and judgment appear  both to be intact and appropriate.    Mood and affect are described as normal mood and full affect.    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     Actions:       Handouts: Erythromycins, Insect Bites/stings.    ___________________________ Marylynn Pearson, Brooke Bonito Starter - Inactive Problems:

## 2014-09-15 MED ORDER — LIDOCAINE HCL 2 % EX GEL
1.0000 "application " | Freq: Once | CUTANEOUS | Status: DC
  Filled 2014-09-15: qty 5

## 2014-09-15 MED ORDER — GATIFLOXACIN 0.5 % OP SOLN: 1.0000 [drp] | OPHTHALMIC | Status: DC | PRN

## 2014-09-16 ENCOUNTER — Ambulatory Visit (HOSPITAL_COMMUNITY): Admission: RE | Admit: 2014-09-16 | Payer: Self-pay | Source: Ambulatory Visit | Admitting: Ophthalmology

## 2014-09-16 ENCOUNTER — Encounter (HOSPITAL_COMMUNITY): Admission: RE | Payer: Self-pay | Source: Ambulatory Visit

## 2014-09-16 SURGERY — EXCISION, PTERYGIUM
Anesthesia: Monitor Anesthesia Care | Site: Eye | Laterality: Left

## 2016-12-11 ENCOUNTER — Ambulatory Visit: Payer: Self-pay | Attending: Internal Medicine

## 2016-12-22 ENCOUNTER — Ambulatory Visit: Payer: Self-pay | Attending: Family Medicine | Admitting: Family Medicine

## 2016-12-22 VITALS — BP 134/71 | HR 72 | Temp 98.3°F | Resp 18 | Ht 66.0 in | Wt 201.4 lb

## 2016-12-22 DIAGNOSIS — Z0000 Encounter for general adult medical examination without abnormal findings: Secondary | ICD-10-CM

## 2016-12-22 DIAGNOSIS — L309 Dermatitis, unspecified: Secondary | ICD-10-CM | POA: Insufficient documentation

## 2016-12-22 DIAGNOSIS — M25561 Pain in right knee: Secondary | ICD-10-CM | POA: Insufficient documentation

## 2016-12-22 DIAGNOSIS — M722 Plantar fascial fibromatosis: Secondary | ICD-10-CM | POA: Insufficient documentation

## 2016-12-22 DIAGNOSIS — M25461 Effusion, right knee: Secondary | ICD-10-CM

## 2016-12-22 DIAGNOSIS — M25562 Pain in left knee: Secondary | ICD-10-CM | POA: Insufficient documentation

## 2016-12-22 MED ORDER — IBUPROFEN 800 MG PO TABS: 800.0000 mg | ORAL_TABLET | Freq: Three times a day (TID) | ORAL | 0 refills | Status: DC | PRN

## 2016-12-22 MED ORDER — KNEE BRACE MISC: 0 refills | Status: DC

## 2016-12-22 MED ORDER — DIPHENHYDRAMINE HCL 25 MG PO CAPS: 25.0000 mg | ORAL_CAPSULE | Freq: Four times a day (QID) | ORAL | 0 refills | Status: DC | PRN

## 2016-12-22 MED ORDER — HYDROCORTISONE 2.5 % EX CREA: TOPICAL_CREAM | Freq: Two times a day (BID) | CUTANEOUS | 0 refills | Status: DC

## 2016-12-22 NOTE — Progress Notes (Signed)
Subjective:  Patient ID: Ricardo Ward, male    DOB: Aug 13, 1971  Age: 46 y.o. MRN: [id number]  CC: No chief complaint on file.   HPI Ricardo Ward presents for   Musculoskeletal pain:  6 months history. Denies any history of injury . Reports intermittent swelling that re-occurs. He also reports 1 year history of stiffness of the  bilateral heels worse early in the day.  He is a Engineer, materials. Pain and swelling is aggravated by standing. Reports taking medications from a relative and sitting down as relieving factors. He reports being unsure of the name of the medication.   Skin rash: 1 months history of pruritis to bilateral lower legs, back, and abdomen. He denies being around anyone [place] with similar symptoms. He denies any new contact with any soaps, detergents, animals, or plants. He denies itching is worse at night. Denies taking anything or symptoms.    No outpatient prescriptions prior to visit.   Facility-Administered Medications Prior to Visit  Medication Dose Route Frequency Provider Last Rate Last Dose  . mitoMYcin (MUTAMYCIN) Injection Use in OR only (0.4 mg/ml)  0.5 mL Left Eye Once Marylynn Pearson, MD        ROS Review of Systems  Constitutional: Negative.   HENT: Negative.   Eyes: Negative.   Respiratory: Negative.   Cardiovascular: Negative.   Gastrointestinal: Negative.   Musculoskeletal: Positive for arthralgias.  Skin:       Skin itching   Objective:  BP 134/71 (BP Location: Left Arm, Patient Position: Sitting, Cuff Size: Normal)   Pulse 72   Temp 98.3 F (36.8 C) (Oral)   Resp 18   Ht 5\' 6"  (1.676 m)   Wt 201 lb 6.4 oz (91.4 kg)   SpO2 100%   BMI 32.51 kg/m   BP/Weight 12/28/2016 12/22/2016 8/36/6294  Systolic BP 765 465 035  Diastolic BP 67 71 99  Wt. (Lbs) 206 201.4 -  BMI 33.25 32.51 -   Physical Exam  Eyes: Conjunctivae are normal. Pupils are equal, round, and reactive to light.  Neck: No JVD present.  Cardiovascular: Normal rate,  regular rhythm, normal heart sounds and intact distal pulses.   Pulmonary/Chest: Effort normal and breath sounds normal.  Abdominal: Soft. Bowel sounds are normal.  Musculoskeletal:  Pain to bilateral knees with extension and flexion with right greater than left; pain and stiffness to bilateral feet with plantar flexion and dorsiflexion.   Skin: Skin is warm and dry.  Nursing note and vitals reviewed.    Assessment & Plan:   Problem List Items Addressed This Visit    None    Visit Diagnoses    Arthralgia of both knees    -  Primary   Relevant Medications   ibuprofen (ADVIL,MOTRIN) 800 MG tablet   Plantar fasciitis, bilateral       Recommend supportive shoes insoles, anti-inflammatory, cold therapy prn, and stretching.    Relevant Medications   ibuprofen (ADVIL,MOTRIN) 800 MG tablet   Dermatitis       Relevant Medications   hydrocortisone 2.5 % cream   diphenhydrAMINE (BENADRYL) 25 mg capsule   Pain and swelling of right knee       Relevant Medications   Elastic Bandages & Supports (KNEE BRACE) MISC   Healthcare maintenance       Relevant Orders   HIV antibody (with reflex)      Meds ordered this encounter  Medications  . ibuprofen (ADVIL,MOTRIN) 800 MG tablet    Sig: Take 1  tablet (800 mg total) by mouth every 8 (eight) hours as needed (Take with food.).    Dispense:  40 tablet    Refill:  0    Order Specific Question:   Supervising Provider    Answer:   Tresa Garter [id number]  . Elastic Bandages & Supports (KNEE BRACE) MISC    Sig: Apply 1 knee brace to right knee. For support and stabiity. To be fitted by medical supply.    Dispense:  1 each    Refill:  0    Order Specific Question:   Supervising Provider    Answer:   Tresa Garter [id number]  . hydrocortisone 2.5 % cream    Sig: Apply topically 2 (two) times daily.    Dispense:  30 g    Refill:  0    Order Specific Question:   Supervising Provider    Answer:   Tresa Garter [id number]  .  diphenhydrAMINE (BENADRYL) 25 mg capsule    Sig: Take 1 capsule (25 mg total) by mouth every 6 (six) hours as needed for itching.    Dispense:  30 capsule    Refill:  0    Order Specific Question:   Supervising Provider    Answer:   Tresa Garter [[id number]]    Follow-up: Return in about 2 weeks (around 01/05/2017) for Physical .   Alfonse Spruce FNP

## 2016-12-22 NOTE — Patient Instructions (Addendum)
Plantar Fasciitis Plantar fasciitis is a painful foot condition that affects the heel. It occurs when the band of tissue that connects the toes to the heel bone (plantar fascia) becomes irritated. This can happen after exercising too much or doing other repetitive activities (overuse injury). The pain from plantar fasciitis can range from mild irritation to severe pain that makes it difficult for you to walk or move. The pain is usually worse in the morning or after you have been sitting or lying down for a while. What are the causes? This condition may be caused by:  Standing for long periods of time.  Wearing shoes that do not fit.  Doing high-impact activities, including running, aerobics, and ballet.  Being overweight.  Having an abnormal way of walking (gait).  Having tight calf muscles.  Having high arches in your feet.  Starting a new athletic activity. What are the signs or symptoms? The main symptom of this condition is heel pain. Other symptoms include:  Pain that gets worse after activity or exercise.  Pain that is worse in the morning or after resting.  Pain that goes away after you walk for a few minutes. How is this diagnosed? This condition may be diagnosed based on your signs and symptoms. Your health care provider will also do a physical exam to check for:  A tender area on the bottom of your foot.  A high arch in your foot.  Pain when you move your foot.  Difficulty moving your foot. You may also need to have imaging studies to confirm the diagnosis. These can include:  X-rays.  Ultrasound.  MRI. How is this treated? Treatment for plantar fasciitis depends on the severity of the condition. Your treatment may include:  Rest, ice, and over-the-counter pain medicines to manage your pain.  Exercises to stretch your calves and your plantar fascia.  A splint that holds your foot in a stretched, upward position while you sleep (night splint).  Physical  therapy to relieve symptoms and prevent problems in the future.  Cortisone injections to relieve severe pain.  Extracorporeal shock wave therapy (ESWT) to stimulate damaged plantar fascia with electrical impulses. It is often used as a last resort before surgery.  Surgery, if other treatments have not worked after 12 months. Follow these instructions at home:  Take medicines only as directed by your health care provider.  Avoid activities that cause pain.  Roll the bottom of your foot over a bag of ice or a bottle of cold water. Do this for 20 minutes, 3-4 times a day.  Perform simple stretches as directed by your health care provider.  Try wearing athletic shoes with air-sole or gel-sole cushions or soft shoe inserts.  Wear a night splint while sleeping, if directed by your health care provider.  Keep all follow-up appointments with your health care provider. How is this prevented?  Do not perform exercises or activities that cause heel pain.  Consider finding low-impact activities if you continue to have problems.  Lose weight if you need to. The best way to prevent plantar fasciitis is to avoid the activities that aggravate your plantar fascia. Contact a health care provider if:  Your symptoms do not go away after treatment with home care measures.  Your pain gets worse.  Your pain affects your ability to move or do your daily activities. This information is not intended to replace advice given to you by your health care provider. Make sure you discuss any questions you have   with your health care provider. Document Released: 05/02/2001 Document Revised: 01/10/2016 Document Reviewed: 06/17/2014 Elsevier Interactive Patient Education  2017 [employer]   Back Pain, Adult Back pain is very common. The pain often gets better over time. The cause of back pain is usually not dangerous. Most people can learn to manage their back pain on their own. Follow these instructions  at home: Watch your back pain for any changes. The following actions may help to lessen any pain you are feeling:  Stay active. Start with short walks on flat ground if you can. Try to walk farther each day.  Exercise regularly as told by your doctor. Exercise helps your back heal faster. It also helps avoid future injury by keeping your muscles strong and flexible.  Do not sit, drive, or stand in one place for more than 30 minutes.  Do not stay in bed. Resting more than 1-2 days can slow down your recovery.  Be careful when you bend or lift an object. Use good form when lifting:  Bend at your knees.  Keep the object close to your body.  Do not twist.  Sleep on a firm mattress. Lie on your side, and bend your knees. If you lie on your back, put a pillow under your knees.  Take medicines only as told by your doctor.  Put ice on the injured area.  Put ice in a plastic bag.  Place a towel between your skin and the bag.  Leave the ice on for 20 minutes, 2-3 times a day for the first 2-3 days. After that, you can switch between ice and heat packs.  Avoid feeling anxious or stressed. Find good ways to deal with stress, such as exercise.  Maintain a healthy weight. Extra weight puts stress on your back. Contact a doctor if:  You have pain that does not go away with rest or medicine.  You have worsening pain that goes down into your legs or buttocks.  You have pain that does not get better in one week.  You have pain at night.  You lose weight.  You have a fever or chills. Get help right away if:  You cannot control when you poop (bowel movement) or pee (urinate).  Your arms or legs feel weak.  Your arms or legs lose feeling (numbness).  You feel sick to your stomach (nauseous) or throw up (vomit).  You have belly (abdominal) pain.  You feel like you may pass out (faint). This information is not intended to replace advice given to you by your health care provider.  Make sure you discuss any questions you have with your health care provider. Document Released: 01/24/2008 Document Revised: 01/13/2016 Document Reviewed: 12/09/2013 Elsevier Interactive Patient Education  2017 [employer].

## 2016-12-22 NOTE — Progress Notes (Signed)
Patient is here for Physical  Patient complains about knot on right knee & pian  Both Heel pain    Patient is not taking any current medication at all  Patient has not eaten for today

## 2016-12-28 ENCOUNTER — Ambulatory Visit: Payer: Self-pay | Attending: Family Medicine | Admitting: Family Medicine

## 2016-12-28 VITALS — BP 120/67 | HR 71 | Temp 98.5°F | Resp 18 | Ht 66.0 in | Wt 206.0 lb

## 2016-12-28 DIAGNOSIS — B86 Scabies: Secondary | ICD-10-CM | POA: Insufficient documentation

## 2016-12-28 DIAGNOSIS — H539 Unspecified visual disturbance: Secondary | ICD-10-CM | POA: Insufficient documentation

## 2016-12-28 DIAGNOSIS — Z8669 Personal history of other diseases of the nervous system and sense organs: Secondary | ICD-10-CM

## 2016-12-28 DIAGNOSIS — M25561 Pain in right knee: Secondary | ICD-10-CM | POA: Insufficient documentation

## 2016-12-28 DIAGNOSIS — M25562 Pain in left knee: Secondary | ICD-10-CM | POA: Insufficient documentation

## 2016-12-28 DIAGNOSIS — G8929 Other chronic pain: Secondary | ICD-10-CM | POA: Insufficient documentation

## 2016-12-28 DIAGNOSIS — Z0000 Encounter for general adult medical examination without abnormal findings: Secondary | ICD-10-CM | POA: Insufficient documentation

## 2016-12-28 DIAGNOSIS — R21 Rash and other nonspecific skin eruption: Secondary | ICD-10-CM | POA: Insufficient documentation

## 2016-12-28 MED ORDER — PERMETHRIN 5 % EX CREA: TOPICAL_CREAM | CUTANEOUS | 1 refills | Status: DC

## 2016-12-28 NOTE — Patient Instructions (Addendum)
You will be called with your labs results. Apply for orange card to complete referral process.  Sarna en los adultos (Scabies, Adult) La sarna es una afeccin cutnea que se produce cuando insectos muy pequeos se introducen debajo de la piel (infestacin). Esto causa una erupcin cutnea y picazn intensa. La sarna puede transmitirse de Ricardo Ward persona a otra (es contagiosa). Si una persona tiene sarna, es frecuente que los dems miembros de la familia tambin contraigan la afeccin. Los sntomas suelen desaparecer en el trmino de 2 a 4semanas con el tratamiento adecuado. Por lo general, la sarna no causa problemas crnicos. CAUSAS La causa de esta afeccin son los caros (Sarcoptes scabiei o aradores de la sarna) que pueden verse solamente con un microscopio. Los caros se introducen en la capa superior de la piel y ponen [place]. La sarna puede transmitirse de Ricardo Ward persona a otra a travs de lo siguiente:  El contacto cercano con una persona que tiene sarna.  El contacto con objetos infestados, como [place], ropa de [place] o prendas de vestir. FACTORES DE RIESGO Es ms probable que esta afeccin se manifieste en:  Las personas que viven en hogares de ancianos y centros de cuidados prolongados.  Las personas que tienen contacto sexual con un compaero que tiene sarna.  Los nios pequeos que asisten a guarderas.  Las [employer] cuidan a Producer, television/film/video con un riesgo mayor de Best boy sarna. SNTOMAS Los sntomas de esta afeccin pueden incluir lo siguiente:  Picazn intensa. La picazn generalmente empeora por la noche.  Una erupcin cutnea con pequeos bultos rojos o ampollas. La erupcin cutnea suele aparecer en la mueca, el codo, la axila, los dedos de la mano, la cintura, la ingle o los glteos. Los bultos pueden formar una lnea (surco) en algunas zonas.  Irritacin de la piel. Esta puede incluir lceras o manchas escamosas. DIAGNSTICO Esta afeccin se diagnostica mediante un  examen fsico. El mdico le examinar exhaustivamente la piel. En algunos casos, el mdico puede tomar una muestra de la piel afectada (raspado de la piel) y la har examinar con un microscopio. TRATAMIENTO El tratamiento de esta afeccin puede incluir lo siguiente:  [employer] o locin con un medicamento para destruir los caros. Este producto se esparce por todo el cuerpo y se deja durante varias horas. Por lo general, un tratamiento con crema o locin con medicamento es suficiente para destruir todos los caros. Cuando los casos son graves, puede que sea necesario repetir Dispensing optician.  Crema con medicamento para Barrister's clerk.  Medicamentos que ayudan a Barrister's clerk.  Medicamentos que [employer] caros. Este tratamiento se utiliza en contadas ocasiones. [place] o aplquese los medicamentos de venta libre y los recetados como se lo haya indicado el mdico.  Aplique la crema o locin con medicamento como se lo haya indicado el mdico.  No enjuague la crema o locin con medicamento hasta tanto haya transcurrido el tiempo necesario. Cuidado de la piel  No se rasque la piel afectada.  Mantenga bien cortas las uas de las manos para reducir las lesiones que se producen al rascarse.  Para aliviar la picazn, tome baos fros o aplquese paos fros en la piel. Instrucciones generales  [place] [employer] con los que haya tenido contacto reciente, entre ellos, la ropa de [place], las prendas de vestir y [place]. Haga esto el mismo da que comience el [place].  Lave los objetos con agua caliente.  Coloque  en bolsas de plstico hermticas durante al menos 3das los objetos que no se puedan lavar. Los caros no sobreviven ms de 3das alejados de la [employer].  Pase la aspiradora por los muebles y los colchones que [place].  Asegrese de que un mdico examine a las dems personas que puedan haberse infestado.  Esto incluye a los miembros de su familia y a Insurance claims handler que pueda haber tenido contacto con los objetos infestados.  Concurra a todas las visitas de control como se lo haya indicado el mdico. Esto es importante. SOLICITE ATENCIN MDICA SI:  La picazn no desaparece despus de 4semanas de tratamiento.  Le siguen apareciendo nuevos bultos o surcos.  Tiene enrojecimiento, hinchazn o dolor en la zona de la erupcin cutnea despus del tratamiento.  Observa lquido, sangre o pus que salen de la erupcin cutnea. Esta informacin no tiene Marine scientist el consejo del mdico. Asegrese de hacerle al mdico cualquier pregunta que tenga. Document Released: 04/28/2015 Document Revised: 04/28/2015 Document Reviewed: 03/09/2015 Elsevier Interactive Patient Education  2017 [employer].

## 2016-12-28 NOTE — Progress Notes (Signed)
Patient is here for physical  Patient complains about itchy on body right knee pain & heel pain

## 2016-12-28 NOTE — Progress Notes (Signed)
Subjective:  Patient ID: Lior Hoen, male    DOB: 06-12-1971  Age: 46 y.o. MRN: [id number]  CC: No chief complaint on file.   HPI Joeph Szatkowski presents for   Interpreter services used :Dustin Flock [id number]  Well Adult Physical: Patient here for a comprehensive physical exam.The patient reports 1 month history of pruritic rash to bilateral lower legs, back, and abdomen. He denies any new contacts or constitutional symptoms. He denies being around anyone else with similar symptoms. Reports minimal relief of symptoms with current medications. He reports 6 months history. Denies any history of injury . Reports intermittent swelling that re-occurs. He also reports 1 year history of stiffness of the  bilateral heels worse early in the day.  He is a Engineer, materials. Pain and swelling is aggravated by standing. Reports taking medications from a relative and sitting down as relieving factors. He reports being unsure of the name of the medication. Denies symptoms of all other pertinent systems.   Outpatient Medications Prior to Visit  Medication Sig Dispense Refill  . diphenhydrAMINE (BENADRYL) 25 mg capsule Take 1 capsule (25 mg total) by mouth every 6 (six) hours as needed for itching. 30 capsule 0  . Elastic Bandages & Supports (KNEE BRACE) MISC Apply 1 knee brace to right knee. For support and stabiity. To be fitted by medical supply. 1 each 0  . hydrocortisone 2.5 % cream Apply topically 2 (two) times daily. 30 g 0  . ibuprofen (ADVIL,MOTRIN) 800 MG tablet Take 1 tablet (800 mg total) by mouth every 8 (eight) hours as needed (Take with food.). 40 tablet 0   Facility-Administered Medications Prior to Visit  Medication Dose Route Frequency Provider Last Rate Last Dose  . mitoMYcin (MUTAMYCIN) Injection Use in OR only (0.4 mg/ml)  0.5 mL Left Eye Once Marylynn Pearson, MD        ROS Review of Systems  Constitutional: Negative.   HENT: Negative.   Eyes: Positive for visual disturbance (history  of cataract removal ).  Respiratory: Negative.   Cardiovascular: Negative.   Gastrointestinal: Negative.   Genitourinary: Negative.   Musculoskeletal: Positive for arthralgias.  Skin: Positive for rash.  Neurological: Negative.   Psychiatric/Behavioral: Negative.     Objective:  BP 120/67 (BP Location: Left Arm, Patient Position: Sitting, Cuff Size: Normal)   Pulse 71   Temp 98.5 F (36.9 C) (Oral)   Resp 18   Ht 5' 6"  (1.676 m)   Wt 206 lb (93.4 kg)   SpO2 100%   BMI 33.25 kg/m   BP/Weight 12/28/2016 12/22/2016 03/21/6552  Systolic BP 748 270 786  Diastolic BP 67 71 99  Wt. (Lbs) 206 201.4 -  BMI 33.25 32.51 -     Physical Exam  Constitutional: He is oriented to person, place, and time. He appears well-developed and well-nourished.  HENT:  Head: Normocephalic and atraumatic.  Right Ear: External ear normal.  Left Ear: External ear normal.  Nose: Nose normal.  Mouth/Throat: Oropharynx is clear and moist.  Eyes: Conjunctivae and EOM are normal. Pupils are equal, round, and reactive to light.  Neck: Normal range of motion. Neck supple. No JVD present.  Cardiovascular: Normal rate, regular rhythm, normal heart sounds and intact distal pulses.   Pulmonary/Chest: Effort normal and breath sounds normal.  Abdominal: Soft. Bowel sounds are normal.  Musculoskeletal: Normal range of motion.  Pain to bilateral knees with extension and flexion with right greater than left; pain and stiffness to bilateral feet with plantar flexion and dorsiflexion.  Neurological: He is alert and oriented to person, place, and time. He has normal reflexes. No cranial nerve deficit. He displays a negative Romberg sign. Coordination and gait normal.  Skin: Skin is warm and dry. Rash noted.  Psychiatric: He has a normal mood and affect.  Nursing note and vitals reviewed.   Assessment & Plan:   Problem List Items Addressed This Visit    None    Visit Diagnoses    Annual physical exam    -   Primary   Relevant Orders   Ambulatory referral to Dentistry   CMP14+EGFR (Completed)   CBC with Differential (Completed)   TSH (Completed)   Lipid Panel (Completed)   Hemoglobin A1c (Completed)   Vitamin D, 25-hydroxy (Completed)   Bilateral chronic knee pain       Relevant Orders   DG Knee Complete 4 Views Left   Ambulatory referral to Orthopedics   Scabies       Reports minimal relief with current medication. If still no symptom improvement with refer to dermatology   Relevant Medications   permethrin (ELIMITE) 5 % cream   History of blurred vision       Vision acuity screen   Relevant Orders   Ambulatory referral to Ophthalmology      Meds ordered this encounter  Medications  . permethrin (ELIMITE) 5 % cream    Sig: Apply once from the neck down to the soles of feet and in between toes. Leave on overnight for at least 8-14 hours. Then shower.    Dispense:  60 g    Refill:  1    Order Specific Question:   Supervising Provider    Answer:   Tresa Garter [id number]    Follow-up: Return if symptoms worsen or fail to improve.   Alfonse Spruce FNP

## 2016-12-29 LAB — CMP14+EGFR
A/G RATIO: 1.8 (ref 1.2–2.2)
ALK PHOS: 82 IU/L (ref 39–117)
ALT: 16 IU/L (ref 0–44)
AST: 18 IU/L (ref 0–40)
Albumin: 4.8 g/dL (ref 3.5–5.5)
BUN/Creatinine Ratio: 17 (ref 9–20)
BUN: 14 mg/dL (ref 6–24)
Bilirubin Total: <0.2 mg/dL (ref 0.0–1.2)
CO2: 22 mmol/L (ref 18–29)
CREATININE: 0.82 mg/dL (ref 0.76–1.27)
Calcium: 9.4 mg/dL (ref 8.7–10.2)
Chloride: 101 mmol/L (ref 96–106)
GFR calc Af Amer: 122 mL/min/{1.73_m2} (ref >59)
GFR calc non Af Amer: 105 mL/min/{1.73_m2} (ref >59)
Globulin, Total: 2.7 g/dL (ref 1.5–4.5)
Glucose: 77 mg/dL (ref 65–99)
Potassium: 4.1 mmol/L (ref 3.5–5.2)
SODIUM: 138 mmol/L (ref 134–144)
Total Protein: 7.5 g/dL (ref 6.0–8.5)

## 2016-12-29 LAB — HEMOGLOBIN A1C
ESTIMATED AVERAGE GLUCOSE: 123 mg/dL
Hgb A1c MFr Bld: 5.9 % — ABNORMAL HIGH (ref 4.8–5.6)

## 2016-12-29 LAB — CBC WITH DIFFERENTIAL/PLATELET
BASOS: 2 %
Basophils Absolute: 0.1 10*3/uL (ref 0.0–0.2)
EOS (ABSOLUTE): 1.1 10*3/uL — ABNORMAL HIGH (ref 0.0–0.4)
EOS: 16 %
HEMATOCRIT: 31.9 % — AB (ref 37.5–51.0)
Hemoglobin: 9 g/dL — ABNORMAL LOW (ref 13.0–17.7)
Immature Grans (Abs): 0 10*3/uL (ref 0.0–0.1)
Immature Granulocytes: 0 %
LYMPHS ABS: 2 10*3/uL (ref 0.7–3.1)
Lymphs: 30 %
MCH: 18.4 pg — ABNORMAL LOW (ref 26.6–33.0)
MCHC: 28.2 g/dL — AB (ref 31.5–35.7)
MCV: 65 fL — AB (ref 79–97)
Monocytes Absolute: 0.7 10*3/uL (ref 0.1–0.9)
Monocytes: 10 %
Neutrophils Absolute: 2.7 10*3/uL (ref 1.4–7.0)
Neutrophils: 42 %
PLATELETS: 419 10*3/uL — AB (ref 150–379)
RBC: 4.9 x10E6/uL (ref 4.14–5.80)
RDW: 21.4 % — ABNORMAL HIGH (ref 12.3–15.4)
WBC: 6.6 10*3/uL (ref 3.4–10.8)

## 2016-12-29 LAB — LIPID PANEL
Chol/HDL Ratio: 3.3 ratio (ref 0.0–5.0)
Cholesterol, Total: 166 mg/dL (ref 100–199)
HDL: 51 mg/dL (ref >39)
LDL CALC: 98 mg/dL (ref 0–99)
Triglycerides: 87 mg/dL (ref 0–149)
VLDL Cholesterol Cal: 17 mg/dL (ref 5–40)

## 2016-12-29 LAB — VITAMIN D 25 HYDROXY (VIT D DEFICIENCY, FRACTURES): Vit D, 25-Hydroxy: 14.2 ng/mL — ABNORMAL LOW (ref 30.0–100.0)

## 2016-12-29 LAB — TSH: TSH: 2.88 u[IU]/mL (ref 0.450–4.500)

## 2017-01-03 ENCOUNTER — Other Ambulatory Visit: Payer: Self-pay | Admitting: Family Medicine

## 2017-01-03 DIAGNOSIS — E559 Vitamin D deficiency, unspecified: Secondary | ICD-10-CM

## 2017-01-03 MED ORDER — VITAMIN D (ERGOCALCIFEROL) 1.25 MG (50000 UNIT) PO CAPS: 50000.0000 [IU] | ORAL_CAPSULE | ORAL | 0 refills | Status: AC

## 2017-01-04 ENCOUNTER — Telehealth: Payer: Self-pay

## 2017-01-04 NOTE — Telephone Encounter (Signed)
CMA call patient regarding lab results  Patient did not answer but left a VM stating the reason of the call & to call me back

## 2017-01-04 NOTE — Telephone Encounter (Signed)
-----   Message from Alfonse Spruce, [place] sent at 01/03/2017  9:14 AM EDT ----- HgbA1c which screens for diabetes indicates you are pre-diabetic. Start eating a carbohydrate modified diet. Avoid eating large amounts of starches, white bread, rice, and sugar. Recommend follow up in 3 months. Kidney function normal Liver function normal Thyroid function normal -Lipid and cholesterol levels normal Labs that evaluated your fluid and electrolyte balance are normal.  Labs that evaluated your blood cells showed chronic anemia. Recommend scheduling follow up appointment to evaluate further cause as soon as possible. Vitamin D level was low. Vitamin D helps to keep bones strong. You were prescribed ergocalciferol (capsules) to increase your vitamin-d level. Once finished start taking OTC vitamin d supplement with 800 international units (IU) of vitamin-d per day. Recommend follow up in 3 months.

## 2017-01-09 ENCOUNTER — Telehealth: Payer: Self-pay | Admitting: Family Medicine

## 2017-01-09 NOTE — Telephone Encounter (Signed)
Patient returned nurse's call regarding his lab results. Pt gave the verbal consent that if nurse calls and he can't come to the phone, to leave a detailed message. Please follow up.  Thank you.

## 2017-01-10 NOTE — Telephone Encounter (Signed)
CMA call patient second time  Patient did not answer but CMA left a detailed message & to call back if have any questions about his  results

## 2017-01-12 ENCOUNTER — Ambulatory Visit: Payer: Self-pay | Admitting: Family Medicine

## 2017-02-05 ENCOUNTER — Ambulatory Visit (INDEPENDENT_AMBULATORY_CARE_PROVIDER_SITE_OTHER): Payer: Self-pay | Admitting: Sports Medicine

## 2017-02-05 ENCOUNTER — Ambulatory Visit
Admission: RE | Admit: 2017-02-05 | Discharge: 2017-02-05 | Disposition: A | Discharge status: Home / Self Care | Payer: No Typology Code available for payment source | Source: Ambulatory Visit | Attending: Sports Medicine | Admitting: Sports Medicine

## 2017-02-05 ENCOUNTER — Encounter: Payer: Self-pay | Admitting: Sports Medicine

## 2017-02-05 VITALS — BP 128/70 | Ht 72.0 in | Wt 210.0 lb

## 2017-02-05 DIAGNOSIS — M25562 Pain in left knee: Principal | ICD-10-CM

## 2017-02-05 DIAGNOSIS — M25561 Pain in right knee: Secondary | ICD-10-CM

## 2017-02-05 DIAGNOSIS — G8929 Other chronic pain: Secondary | ICD-10-CM

## 2017-02-05 MED ORDER — DICLOFENAC SODIUM 75 MG PO TBEC: 75.0000 mg | DELAYED_RELEASE_TABLET | Freq: Two times a day (BID) | ORAL | 0 refills | Status: DC

## 2017-02-05 NOTE — Progress Notes (Signed)
   Subjective:    Ricardo Ward - 46 y.o. male MRN [id number]  Date of birth: 1970-11-15  CC: Bilateral knee pain (R>L)  HPI: Ricardo Ward is a 46 y/o male presenting with bilateral knee pain. He states that his right knee hurts the most and that it has been going on for almost a year now. The pain is located over the right medial joint line. Pain is worse with rest and at night. He has tired motrin for it with minimal relief. Denies trauma, radiation, numbness, or tingling. He states that sometimes his left knee and left heel also hurt because he tries to compensate by not putting weight on his right knee. [place] and does significant heavy lifting. Remote surgical history of lumbar spine surgery in 1991 after falling from a horse. A interpretor was used during this visit.  ROS: Denies fevers, chills, or weight changes. Negative except per HPI.  Objective:   Physical Exam BP 128/70   Ht 6' (1.829 m)   Wt 210 lb (95.3 kg)   BMI 28.48 kg/m  Gen: NAD, alert, cooperative with exam, well-appearing Neuro: no gross deficits.  Psych: good insight, alert and oriented Right Knee: Mild effusion and TTP over the medial joint line. No erythema or obvious bony abnormalities. No patellar tenderness. FROM upon flexion and extension. ACL, PCL, LCL, MCL are intact Positive Thessaly's test Hamstring and quadriceps strength is 5/5    Assessment & Plan:  Ricardo Ward is a 46 y/o male presenting with bilateral knee pain.  Degenerative joint disease plus possible degenerative right meniscus injury His chronic symptoms and exam are highly suspicious for degenerative joint disease. In addition, he may have meniscus injury to his right knee given a positive Thessaly's test. We will obtain weight bearing X-rays of his knees. In the interim, I recommend stopping the motrin and starting diclofenac for better pain management. He will also benefit from wearing an ace wrap and can f/u with me in  three weeks. -Bilateral knee X-rays -D/c Motrin and start diclofenac 75mg  BID -Ace wrap for right knee -F/u in 3 weeks  I personally was present and performed or re-performed the history, physical exam and medical decision-making activities of this service and have verified that the service and findings are accurately documented in the student's note. I agree with the above plan of care. Patient will follow-up with me in 3 weeks and we will review his x-rays. If he continues to have pain and we may consider a single intra-articular cortisone injection prior to considering further diagnostic imaging.

## 2017-02-05 NOTE — Patient Instructions (Signed)
You may have degenerative disease of your knees so we will obtain X-rays of your knees. In the meantime: 1. Please stop the motrin 2. Start diclofenac prescription that was given today 3. Wear Ace wrap on your right knee 4. Follow up back in 3 weeks

## 2017-03-05 ENCOUNTER — Ambulatory Visit (INDEPENDENT_AMBULATORY_CARE_PROVIDER_SITE_OTHER): Payer: Self-pay | Admitting: Sports Medicine

## 2017-03-05 DIAGNOSIS — M722 Plantar fascial fibromatosis: Secondary | ICD-10-CM

## 2017-03-05 DIAGNOSIS — M25561 Pain in right knee: Secondary | ICD-10-CM | POA: Insufficient documentation

## 2017-03-05 DIAGNOSIS — G8929 Other chronic pain: Secondary | ICD-10-CM

## 2017-03-05 NOTE — Assessment & Plan Note (Signed)
Likely from standing on her feet all day at work, reassuringly bilateral knee x-rays are normal. Pain resolved with diclofenac. -Continue diclofenac when necessary -Return to clinic as needed if the pain flares again

## 2017-03-05 NOTE — Progress Notes (Signed)
Subjective:    Patient ID: Ricardo Ward , male   DOB: 05-16-1971 , 46 y.o..   MRN: [id number]  HPI  *In person Spanish interpreter used for this visit*   Ricardo Ward is here for:  1. Right knee pain: Patient is following up for right knee pain that he has had for the last 1-1/2-2 years. At his last visit here he was told to take diclofenac. Bilateral knee x-rays are unremarkable. He notes that since taking the diclofenac he has had relief of his pain. He continues to wrap his knee and thinks that this has been helping a lot.  2. Left heel pain: Patient admits to left heel pain 1 year. He notes that at first the pain was intermittent and would occur every couple weeks but then the pain became gradual and occurs every day. The pain is sharp and is worse at the end of the day. He notes that he does have some pain in the mornings when he wakes up but it is nowhere near as painful as it is after working all day. 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 He notes that he wears special kitchen shoes but they do not have any extra padding in him. Denies any previous trauma to his left heel or foot. Denies any numbness, tingling, weakness  Review of Systems: Per HPI.   Past Medical History: Patient Active Problem List   Diagnosis Date Noted  . Knee pain, right 03/05/2017  . Plantar fasciitis of left foot 03/05/2017  . Pterygium of both eyes 05/26/2013     Social Hx:  reports that he quit smoking about 18 years ago. His smoking use included Cigarettes. He has a 13.00 pack-year smoking history. He has never used smokeless tobacco.   Objective:   BP 136/88   Ht 5\' 7"  (1.702 m)   Wt 210 lb (95.3 kg)   BMI 32.89 kg/m  Physical Exam  Gen: NAD, alert, cooperative with exam, well-appearing Right Knee: Normal to inspection with no erythema or effusion or obvious bony abnormalities. Palpation normal with no warmth, joint line tenderness, patellar  tenderness, or condyle tenderness. ROM full in flexion and extension and lower leg rotation. Ligaments with solid consistent endpoints including ACL, PCL, LCL, MCL. Non painful patellar compression. Patellar glide without crepitus. Hamstring and quadriceps strength is normal.  Neurovascularly intact  Left Ankle & Foot: Inspection : No visible swelling, ecchymosis, erythema, ulcers, calluses, blister. Arch: Mild pes planus. Toes: no claw or hammer deformity  Palpation: No pain at MT heads, No pain at base of 5th MT; No tenderness over cuboid;No tenderness over N spot or navicular prominence. No tenderness on lateral and medial malleolus. Positive tenderness to palpation over the medial calcaneal origin of the plantar fascia Range of motion: Full in plantarflexion, dorsiflexion, inversion, and eversion of the foot; flexion and extension of the toes Strength: 5/5 in all directions. Sensation: intact throughout Neurovascularly intact  Assessment & Plan:  Knee pain, right Likely from standing on her feet all day at work, reassuringly bilateral knee x-rays are normal. Pain resolved with diclofenac. -Continue diclofenac when necessary -Return to clinic as needed if the pain flares again  Plantar fasciitis of left foot Likely secondary to standing on feet all day with unsupportive shoes also has mild pes planus. Patient possibly standing on left foot more than right when he had his right knee pain. -Continue diclofenac when necessary -Patient  will return to clinic in the next week and bring his work shoes to fit for orthotic inserts   Smitty Cords, MD [place], PGY-3  Patient seen and evaluated with the resident. I agree with the above plan of care. Patient's knee pain has improved. Main complaint is plantar fasciitis of the left foot. 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 We will plan on fitting those with  a green sport insert and a scaphoid pad.

## 2017-03-05 NOTE — Assessment & Plan Note (Signed)
Likely secondary to standing on feet all day with unsupportive shoes also has mild pes planus. Patient possibly standing on left foot more than right when he had his right knee pain. -Continue diclofenac when necessary -Patient will return to clinic in the next week and bring his work shoes to fit for orthotic inserts

## 2017-03-27 ENCOUNTER — Ambulatory Visit: Payer: Self-pay

## 2017-03-28 ENCOUNTER — Ambulatory Visit: Payer: Self-pay | Attending: Family Medicine

## 2018-12-20 DIAGNOSIS — U071 COVID-19: Secondary | ICD-10-CM

## 2018-12-20 HISTORY — DX: COVID-19: U07.1

## 2019-01-22 ENCOUNTER — Emergency Department (HOSPITAL_COMMUNITY): Payer: HRSA Program

## 2019-01-22 ENCOUNTER — Emergency Department (HOSPITAL_COMMUNITY)
Admission: EM | Admit: 2019-01-22 | Discharge: 2019-01-22 | Disposition: A | Discharge status: Home / Self Care | Payer: HRSA Program | Attending: Emergency Medicine | Admitting: Emergency Medicine

## 2019-01-22 ENCOUNTER — Other Ambulatory Visit: Payer: Self-pay

## 2019-01-22 ENCOUNTER — Encounter (HOSPITAL_COMMUNITY): Payer: Self-pay

## 2019-01-22 DIAGNOSIS — Z20822 Contact with and (suspected) exposure to covid-19: Secondary | ICD-10-CM

## 2019-01-22 DIAGNOSIS — Z87891 Personal history of nicotine dependence: Secondary | ICD-10-CM | POA: Diagnosis not present

## 2019-01-22 DIAGNOSIS — R509 Fever, unspecified: Secondary | ICD-10-CM

## 2019-01-22 DIAGNOSIS — R059 Cough, unspecified: Secondary | ICD-10-CM

## 2019-01-22 DIAGNOSIS — R05 Cough: Secondary | ICD-10-CM | POA: Diagnosis present

## 2019-01-22 DIAGNOSIS — U071 COVID-19: Secondary | ICD-10-CM | POA: Diagnosis not present

## 2019-01-22 MED ORDER — AZITHROMYCIN 250 MG PO TABS: 250.0000 mg | ORAL_TABLET | Freq: Every day | ORAL | 0 refills | Status: DC

## 2019-01-22 MED ORDER — AZITHROMYCIN 250 MG PO TABS
500.0000 mg | ORAL_TABLET | Freq: Once | ORAL | Status: AC
  Administered 2019-01-22: 500 mg via ORAL
  Filled 2019-01-22: qty 2

## 2019-01-22 MED ORDER — BENZONATATE 100 MG PO CAPS: 100.0000 mg | ORAL_CAPSULE | Freq: Three times a day (TID) | ORAL | 0 refills | Status: DC

## 2019-01-22 MED ORDER — ALBUTEROL SULFATE HFA 108 (90 BASE) MCG/ACT IN AERS
2.0000 | INHALATION_SPRAY | Freq: Once | RESPIRATORY_TRACT | Status: AC
  Administered 2019-01-22: 2 via RESPIRATORY_TRACT
  Filled 2019-01-22: qty 6.7

## 2019-01-22 NOTE — ED Provider Notes (Signed)
[name] Provider Note   CSN: [id number] Arrival date & time: 01/22/19  0055    History   Chief Complaint Chief Complaint  Patient presents with   Cough    HPI Ricardo Ward is a 48 y.o. male.     The history is provided by the patient and medical records.  Cough  Associated symptoms: fever      48 year old male presenting to the ED with cough and fever for the past 4 days.  History was obtained via language interpreter as patient primarily speaks [place].  Reports he started feeling bad on Saturday with subjective fever, states he did not check because he does not have a thermometer.  States his skin felt very warm and he had chills.  He reports cough that is productive with clear spit and a little bit of phlegm, but states it does not appear discolored like with "infection".  States he had relatives that have been sick with similar recently, states they were not tested for coronavirus but told it was more likely a cold.  He has not had any recent travel or other sick contacts.  States once he started feeling ill he isolated in his home in a separate bedroom from the rest of his family.  He denies any chest pain or shortness of breath, reports he does have congestion in his nose making it difficult to breathe through his nostrils.  He is not had any sore throat, loss of smell, nausea, or vomiting.  He has been able to eat and drink fairly well.  He has not tried any medications for his symptoms prior to arrival.  States he is otherwise healthy without any known medical problems and does not take any daily medications.  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 Authorizing Provider  diclofenac (VOLTAREN) 75 MG EC tablet Take 1 tablet (75 mg total) by mouth 2 (two) times daily. 02/05/17   Thurman Coyer, DO  diphenhydrAMINE (BENADRYL) 25 mg capsule Take 1 capsule (25 mg total) by mouth every 6 (six) hours as needed for itching. 12/22/16   Alfonse Spruce, FNP  Elastic Bandages & Supports (KNEE BRACE) MISC Apply 1 knee brace to right knee. For support and stabiity. To be fitted by medical supply. 12/22/16   Alfonse Spruce, FNP  hydrocortisone 2.5 % cream Apply topically 2 (two) times daily. 12/22/16   Alfonse Spruce, FNP  ibuprofen (ADVIL,MOTRIN) 800 MG tablet Take 1 tablet (800 mg total) by mouth every 8 (eight) hours as needed (Take with food.). 12/22/16   Alfonse Spruce, FNP  permethrin (ELIMITE) 5 % cream Apply once  from the neck down to the soles of feet and in between toes. Leave on overnight for at least 8-14 hours. Then shower. 12/28/16   Alfonse Spruce, FNP    Family History History reviewed. No pertinent family history.  Social History Social History   Tobacco Use   Smoking status: Former Smoker    Packs/day: 1.00    Years: 13.00    Pack years: 13.00    Types: Cigarettes    Last attempt to quit: 12/10/1998    Years since quitting: 20.1   Smokeless tobacco: Never Used  Substance Use Topics   Alcohol use: No   Drug use: No     Allergies   Patient has no known allergies.   Review of Systems Review of Systems  Constitutional: Positive for fever.  Respiratory: Positive for  cough.   All other systems reviewed and are negative.    Physical Exam Updated Vital Signs BP 106/74 (BP Location: Right Arm)    Pulse 84    Temp 99.2 F (37.3 C) (Oral)    Resp (!) 22    Ht 5\' 6"  (1.676 m)    Wt 95.3 kg    SpO2 99%    BMI 33.89 kg/m   Physical Exam Vitals signs and nursing note reviewed.  Constitutional:      Appearance: He is well-developed.     Comments: NAD, appears comfortable  HENT:     Head: Normocephalic and atraumatic.  Eyes:     Conjunctiva/sclera: Conjunctivae normal.     Pupils: Pupils are equal, round, and reactive to light.  Neck:     Musculoskeletal: Normal range of motion.  Cardiovascular:     Rate and Rhythm: Normal rate and regular rhythm.     Heart sounds: Normal heart sounds.  Pulmonary:     Effort: Pulmonary effort is normal.     Breath sounds: Normal breath sounds.     Comments: Lungs mostly clear, he is breathing easy and talking in paragraphs without issue, RR about 20-22 during exam, no acute distress noted, wet cough present Abdominal:     General: Bowel sounds are normal.     Palpations: Abdomen is soft.  Musculoskeletal: Normal range of motion.  Skin:    General: Skin is warm and dry.  Neurological:     Mental Status: He is alert and oriented to person, place, and time.      ED Treatments / Results  Labs (all labs ordered are listed, but only abnormal results are displayed) Labs Reviewed  NOVEL CORONAVIRUS, NAA (HOSPITAL ORDER, SEND-OUT TO REF LAB)    EKG None  Radiology Dg Chest Port 1 View  Result Date: 01/22/2019 CLINICAL DATA:  Cough and fever EXAM: PORTABLE CHEST 1 VIEW COMPARISON:  08/30/2009 FINDINGS: The cardiac silhouette is at the upper limits of normal with respect to size. There is no pneumothorax. No large pleural effusion. There is a density projecting over the ninth rib posteriorly on the left. There is no acute osseous abnormality. IMPRESSION: 1. No definite acute cardiopulmonary process. 2. Small  opacity projecting over the posterior left ninth rib which may represent atelectasis, and infiltrate, or a pulmonary nodule. A 4-6 week follow-up two-view chest x-ray is recommended to confirm resolution of this finding. Electronically Signed   By: Constance Holster M.D.   On: 01/22/2019 02:04    Procedures Procedures (including critical care time)  Medications Ordered in ED Medications  azithromycin (ZITHROMAX) tablet 500 mg (500 mg Oral Given 01/22/19 0315)  albuterol (VENTOLIN HFA) 108 (90 Base) MCG/ACT inhaler 2 puff (2 puffs Inhalation Given 01/22/19 0315)     Initial Impression / Assessment and Plan / ED Course  I have reviewed the triage vital signs and the nursing notes.  Pertinent labs & imaging results that were available during my care of the patient were reviewed by me and considered in my medical decision making (see chart for details).  48 year old male presenting to the ED with cough and fever for the past 4 days.  Reports family members have been sick but were not tested for coronavirus.  He denies any other sick exposures.  He is febrile here but overall nontoxic in appearance.  His lungs are overall clear and he is breathing easy and able to talk in paragraphs form during exam without issue.  His respiratory rate is around 20-22 on my assessment.  He does have a intermittent wet cough.  Chest x-ray was performed from triage revealing small opacity over the posterior left ninth rib, questionable atelectasis, infiltrate, or nodule.  In the context of his cough and fever I favor infiltrate.  Patient has had some sick contacts, no known COVID exposures but I discussed with him that this is a possibility.  He is comfortable here on room air with stable vital signs I do not feel he needs admission, but will send outpatient COVID test.  Will treat with course of azithromycin, albuterol, and Tessalon for symptomatic management.  Discussed that he will be notified of his results in the next  48 hours or so.  He should use Tylenol for fever, continue to isolate at home as much as possible as per CDC guidelines.  He will need to follow-up with his primary care doctor.  He should return here for any new or acute changes, specifically shortness of breath, chest pain, uncontrolled fever, etc.  Ricardo Ward was evaluated in Emergency Department on 01/22/2019 for the symptoms described in the history of present illness. He was evaluated in the context of the global COVID-19 pandemic, which necessitated consideration that the patient might be at risk for infection with the SARS-CoV-2 virus that causes COVID-19. Institutional protocols and algorithms that pertain to the evaluation of patients at risk for COVID-19 are in a state of rapid change based on information released by regulatory bodies including the CDC and federal and state organizations. These policies and algorithms were followed during the patient's care in the ED.  Final Clinical Impressions(s) / ED Diagnoses   Final diagnoses:  Cough  Fever in adult  Suspected Covid-19 Virus Infection    ED Discharge Orders         Ordered    azithromycin (ZITHROMAX) 250 MG tablet  Daily     01/22/19 0340    benzonatate (TESSALON) 100 MG capsule  Every 8 hours     01/22/19 0340           Larene Pickett, PA-C 67/20/94 [id number]    Delora Fuel, MD 28/36/62 [phone]

## 2019-01-22 NOTE — Discharge Instructions (Signed)
Take the prescribed medication as directed.  Take tylenol for fever.   Can continue using inhaler when needed. Try to continue to isolate yourself away from family members like we discussed.  See CDC guidelines below. You should be notified about your COVID test in the next 48 hours or so. Follow-up with your primary care doctor. Return to the ED for new or worsening symptoms-- shortness of breath, chest pain, etc.     Person Under Monitoring Name: [facility]  Location: [place] [address]   Infection Prevention Recommendations for Individuals Confirmed to have, or Being Evaluated for, 2019 Novel Coronavirus (COVID-19) Infection Who Receive Care at Home  Individuals who are confirmed to have, or are being evaluated for, COVID-19 should follow the prevention steps below until a healthcare provider or local or state health department says they can return to normal activities.  Stay home except to get medical care You should restrict activities outside your home, except for getting medical care. Do not go to work, school, or public areas, and do not use public transportation or taxis.  Call ahead before visiting your doctor Before your medical appointment, call the healthcare provider and tell them that you have, or are being evaluated for, COVID-19 infection. This will help the healthcare providers office take steps to keep other people from getting infected. Ask your healthcare provider to call the local or state health department.  Monitor your symptoms Seek prompt medical attention if your illness is worsening (e.g., difficulty breathing). Before going to your medical appointment, call the healthcare provider and tell them that you have, or are being evaluated for, COVID-19 infection. Ask your healthcare provider to call the local or state health department.  Wear a facemask You should wear a facemask that covers your nose and mouth when you are in the same  room with other people and when you visit a healthcare provider. People who live with or visit you should also wear a facemask while they are in the same room with you.  Separate yourself from other people in your home As much as possible, you should stay in a different room from other people in your home. Also, you should use a separate bathroom, if available.  Avoid sharing household items You should not share dishes, drinking glasses, cups, eating utensils, towels, bedding, or other items with other people in your home. After using these items, you should wash them thoroughly with soap and water.  Cover your coughs and sneezes Cover your mouth and nose with a tissue when you cough or sneeze, or you can cough or sneeze into your sleeve. Throw used tissues in a lined trash can, and immediately wash your hands with soap and water for at least 20 seconds or use an alcohol-based hand rub.  Wash your [facility] your hands often and thoroughly with soap and water for at least 20 seconds. You can use an alcohol-based hand sanitizer if soap and water are not available and if your hands are not visibly dirty. Avoid touching your eyes, nose, and mouth with unwashed hands.   Prevention Steps for Caregivers and Household Members of Individuals Confirmed to have, or Being Evaluated for, COVID-19 Infection Being Cared for in the Home  If you live with, or provide care at home for, a person confirmed to have, or being evaluated for, COVID-19 infection please follow these guidelines to prevent infection:  Follow healthcare providers instructions Make sure that you understand and can help the patient  follow any healthcare provider instructions for all care.  Provide for the patients basic needs You should help the patient with basic needs in the home and provide support for getting groceries, prescriptions, and other personal needs.  Monitor the patients symptoms If they are getting sicker,  call his or her medical provider and tell them that the patient has, or is being evaluated for, COVID-19 infection. This will help the healthcare providers office take steps to keep other people from getting infected. Ask the healthcare provider to call the local or state health department.  Limit the number of people who have contact with the patient If possible, have only one caregiver for the patient. Other household members should stay in another home or place of residence. If this is not possible, they should stay in another room, or be separated from the patient as much as possible. Use a separate bathroom, if available. Restrict visitors who do not have an essential need to be in the home.  Keep older adults, very young children, and other sick people away from the patient Keep older adults, very young children, and those who have compromised immune systems or chronic health conditions away from the patient. This includes people with chronic heart, lung, or kidney conditions, diabetes, and cancer.  Ensure good ventilation Make sure that shared spaces in the home have good air flow, such as from an air conditioner or an opened window, weather permitting.  Wash your hands often Wash your hands often and thoroughly with soap and water for at least 20 seconds. You can use an alcohol based hand sanitizer if soap and water are not available and if your hands are not visibly dirty. Avoid touching your eyes, nose, and mouth with unwashed hands. Use disposable paper towels to dry your hands. If not available, use dedicated cloth towels and replace them when they become wet.  Wear a facemask and gloves Wear a disposable facemask at all times in the room and gloves when you touch or have contact with the patients blood, body fluids, and/or secretions or excretions, such as sweat, saliva, sputum, nasal mucus, vomit, urine, or feces.  Ensure the mask fits over your nose and mouth tightly, and do  not touch it during use. Throw out disposable facemasks and gloves after using them. Do not reuse. Wash your hands immediately after removing your facemask and gloves. If your personal clothing becomes contaminated, carefully remove clothing and launder. Wash your hands after handling contaminated clothing. Place all used disposable facemasks, gloves, and other waste in a lined container before disposing them with other household waste. Remove gloves and wash your hands immediately after handling these items.  Do not share dishes, glasses, or other household items with the patient Avoid sharing household items. You should not share dishes, drinking glasses, cups, eating utensils, towels, bedding, or other items with a patient who is confirmed to have, or being evaluated for, COVID-19 infection. After the person uses these items, you should wash them thoroughly with soap and water.  Wash laundry thoroughly Immediately remove and wash clothes or bedding that have blood, body fluids, and/or secretions or excretions, such as sweat, saliva, sputum, nasal mucus, vomit, urine, or feces, on them. Wear gloves when handling laundry from the patient. Read and follow directions on labels of laundry or clothing items and detergent. In general, wash and dry with the warmest temperatures recommended on the label.  Clean all areas the individual has used often Clean all touchable surfaces, such as  counters, tabletops, doorknobs, bathroom fixtures, toilets, phones, keyboards, tablets, and bedside tables, every day. Also, clean any surfaces that may have blood, body fluids, and/or secretions or excretions on them. Wear gloves when cleaning surfaces the patient has come in contact with. Use a diluted bleach solution (e.g., dilute bleach with 1 part bleach and 10 parts water) or a household disinfectant with a label that says EPA-registered for coronaviruses. To make a bleach solution at home, add 1 tablespoon of  bleach to 1 quart (4 cups) of water. For a larger supply, add  cup of bleach to 1 gallon (16 cups) of water. Read labels of cleaning products and follow recommendations provided on product labels. Labels contain instructions for safe and effective use of the cleaning product including precautions you should take when applying the product, such as wearing gloves or eye protection and making sure you have good ventilation during use of the product. Remove gloves and wash hands immediately after cleaning.  Monitor yourself for signs and symptoms of illness Caregivers and household members are considered close contacts, should monitor their health, and will be asked to limit movement outside of the home to the extent possible. Follow the monitoring steps for close contacts listed on the symptom monitoring form.   ? If you have additional questions, contact your local health department or call the epidemiologist on call at [phone] (available 24/7). ? This guidance is subject to change. For the most up-to-date guidance from [facility], please refer to their website: YouBlogs.pl

## 2019-01-22 NOTE — ED Triage Notes (Signed)
Pt c/o cough and fever x 5 days. Productive cough with clear sputum.  **Pt will require an interpreter.

## 2019-01-23 LAB — NOVEL CORONAVIRUS, NAA (HOSP ORDER, SEND-OUT TO REF LAB; TAT 18-24 HRS): SARS-CoV-2, NAA: DETECTED — AB

## 2019-01-24 ENCOUNTER — Inpatient Hospital Stay (HOSPITAL_COMMUNITY)
Admission: EM | Admit: 2019-01-24 | Discharge: 2019-01-29 | DRG: 177 | Disposition: A | Discharge status: Home / Self Care | Payer: Medicaid Other | Attending: Internal Medicine | Admitting: Internal Medicine

## 2019-01-24 ENCOUNTER — Emergency Department (HOSPITAL_COMMUNITY): Payer: Medicaid Other

## 2019-01-24 ENCOUNTER — Other Ambulatory Visit: Payer: Self-pay

## 2019-01-24 ENCOUNTER — Encounter (HOSPITAL_COMMUNITY): Payer: Self-pay

## 2019-01-24 DIAGNOSIS — J1289 Other viral pneumonia: Secondary | ICD-10-CM | POA: Diagnosis present

## 2019-01-24 DIAGNOSIS — Z87891 Personal history of nicotine dependence: Secondary | ICD-10-CM

## 2019-01-24 DIAGNOSIS — E1165 Type 2 diabetes mellitus with hyperglycemia: Secondary | ICD-10-CM

## 2019-01-24 DIAGNOSIS — D509 Iron deficiency anemia, unspecified: Secondary | ICD-10-CM

## 2019-01-24 DIAGNOSIS — J9621 Acute and chronic respiratory failure with hypoxia: Secondary | ICD-10-CM

## 2019-01-24 DIAGNOSIS — R0602 Shortness of breath: Secondary | ICD-10-CM

## 2019-01-24 DIAGNOSIS — R0902 Hypoxemia: Secondary | ICD-10-CM

## 2019-01-24 DIAGNOSIS — U071 COVID-19: Principal | ICD-10-CM

## 2019-01-24 DIAGNOSIS — E119 Type 2 diabetes mellitus without complications: Secondary | ICD-10-CM | POA: Diagnosis not present

## 2019-01-24 DIAGNOSIS — J1282 Pneumonia due to coronavirus disease 2019: Secondary | ICD-10-CM | POA: Diagnosis present

## 2019-01-24 LAB — CBC WITH DIFFERENTIAL/PLATELET
Abs Immature Granulocytes: 0.06 10*3/uL (ref 0.00–0.07)
Basophils Absolute: 0 10*3/uL (ref 0.0–0.1)
Basophils Relative: 0 %
Eosinophils Absolute: 0.4 10*3/uL (ref 0.0–0.5)
Eosinophils Relative: 4 %
HCT: 30.4 % — ABNORMAL LOW (ref 39.0–52.0)
Hemoglobin: 8.6 g/dL — ABNORMAL LOW (ref 13.0–17.0)
Immature Granulocytes: 1 %
Lymphocytes Relative: 11 %
Lymphs Abs: 1 10*3/uL (ref 0.7–4.0)
MCH: 17.6 pg — ABNORMAL LOW (ref 26.0–34.0)
MCHC: 28.3 g/dL — ABNORMAL LOW (ref 30.0–36.0)
MCV: 62.3 fL — ABNORMAL LOW (ref 80.0–100.0)
Monocytes Absolute: 0.7 10*3/uL (ref 0.1–1.0)
Monocytes Relative: 8 %
Neutro Abs: 6.6 10*3/uL (ref 1.7–7.7)
Neutrophils Relative %: 76 %
Platelets: 455 10*3/uL — ABNORMAL HIGH (ref 150–400)
RBC: 4.88 MIL/uL (ref 4.22–5.81)
RDW: 20.7 % — ABNORMAL HIGH (ref 11.5–15.5)
WBC: 8.7 10*3/uL (ref 4.0–10.5)
nRBC: 0 % (ref 0.0–0.2)

## 2019-01-24 LAB — CBC
HCT: 27.6 % — ABNORMAL LOW (ref 39.0–52.0)
Hemoglobin: 7.8 g/dL — ABNORMAL LOW (ref 13.0–17.0)
MCH: 17.6 pg — ABNORMAL LOW (ref 26.0–34.0)
MCHC: 28.3 g/dL — ABNORMAL LOW (ref 30.0–36.0)
MCV: 62.2 fL — ABNORMAL LOW (ref 80.0–100.0)
Platelets: 476 10*3/uL — ABNORMAL HIGH (ref 150–400)
RBC: 4.44 MIL/uL (ref 4.22–5.81)
RDW: 20.5 % — ABNORMAL HIGH (ref 11.5–15.5)
WBC: 6.8 10*3/uL (ref 4.0–10.5)
nRBC: 0.3 % — ABNORMAL HIGH (ref 0.0–0.2)

## 2019-01-24 LAB — FIBRINOGEN: Fibrinogen: 795 mg/dL — ABNORMAL HIGH (ref 210–475)

## 2019-01-24 LAB — SEDIMENTATION RATE: Sed Rate: 50 mm/hr — ABNORMAL HIGH (ref 0–16)

## 2019-01-24 LAB — C-REACTIVE PROTEIN: CRP: 5.5 mg/dL — ABNORMAL HIGH (ref <1.0)

## 2019-01-24 LAB — BASIC METABOLIC PANEL
Anion gap: 13 (ref 5–15)
BUN: 11 mg/dL (ref 6–20)
CO2: 22 mmol/L (ref 22–32)
Calcium: 9.4 mg/dL (ref 8.9–10.3)
Chloride: 102 mmol/L (ref 98–111)
Creatinine, Ser: 0.88 mg/dL (ref 0.61–1.24)
GFR calc Af Amer: >60 mL/min (ref >60)
GFR calc non Af Amer: >60 mL/min (ref >60)
Glucose, Bld: 133 mg/dL — ABNORMAL HIGH (ref 70–99)
Potassium: 4.1 mmol/L (ref 3.5–5.1)
Sodium: 137 mmol/L (ref 135–145)

## 2019-01-24 LAB — CREATININE, SERUM
Creatinine, Ser: 0.68 mg/dL (ref 0.61–1.24)
GFR calc Af Amer: >60 mL/min (ref >60)
GFR calc non Af Amer: >60 mL/min (ref >60)

## 2019-01-24 LAB — TYPE AND SCREEN
ABO/RH(D): O POS
Antibody Screen: NEGATIVE

## 2019-01-24 LAB — IRON AND TIBC
Iron: 11 ug/dL — ABNORMAL LOW (ref 45–182)
Saturation Ratios: 3 % — ABNORMAL LOW (ref 17.9–39.5)
TIBC: 389 ug/dL (ref 250–450)
UIBC: 378 ug/dL

## 2019-01-24 LAB — VITAMIN B12: Vitamin B-12: 372 pg/mL (ref 180–914)

## 2019-01-24 LAB — RETICULOCYTES
Immature Retic Fract: 11.5 % (ref 2.3–15.9)
RBC.: 4.44 MIL/uL (ref 4.22–5.81)
Retic Count, Absolute: 18.6 10*3/uL — ABNORMAL LOW (ref 19.0–186.0)
Retic Ct Pct: 0.4 % (ref 0.4–3.1)

## 2019-01-24 LAB — FOLATE: Folate: 20.8 ng/mL (ref >5.9)

## 2019-01-24 LAB — BRAIN NATRIURETIC PEPTIDE
B Natriuretic Peptide: 31.6 pg/mL (ref 0.0–100.0)
B Natriuretic Peptide: 11.2 pg/mL (ref 0.0–100.0)

## 2019-01-24 LAB — TRIGLYCERIDES: Triglycerides: 116 mg/dL (ref <150)

## 2019-01-24 LAB — PROCALCITONIN: Procalcitonin: <0.1 ng/mL

## 2019-01-24 LAB — D-DIMER, QUANTITATIVE: D-Dimer, Quant: 3.92 ug/mL-FEU — ABNORMAL HIGH (ref 0.00–0.50)

## 2019-01-24 LAB — FERRITIN: Ferritin: 21 ng/mL — ABNORMAL LOW (ref 24–336)

## 2019-01-24 LAB — LACTATE DEHYDROGENASE: LDH: 230 U/L — ABNORMAL HIGH (ref 98–192)

## 2019-01-24 LAB — TROPONIN I: Troponin I: <0.03 ng/mL (ref <0.03)

## 2019-01-24 MED ORDER — HYDROCODONE-ACETAMINOPHEN 5-325 MG PO TABS
1.0000 | ORAL_TABLET | ORAL | Status: DC | PRN
  Administered 2019-01-29: 2 via ORAL
  Filled 2019-01-24: qty 2

## 2019-01-24 MED ORDER — AEROCHAMBER PLUS FLO-VU LARGE MISC
Status: AC
  Filled 2019-01-24: qty 1

## 2019-01-24 MED ORDER — ACETAMINOPHEN 325 MG PO TABS: 650.0000 mg | ORAL_TABLET | Freq: Four times a day (QID) | ORAL | Status: DC | PRN

## 2019-01-24 MED ORDER — ONDANSETRON HCL 4 MG PO TABS: 4.0000 mg | ORAL_TABLET | Freq: Four times a day (QID) | ORAL | Status: DC | PRN

## 2019-01-24 MED ORDER — ALBUTEROL SULFATE HFA 108 (90 BASE) MCG/ACT IN AERS
8.0000 | INHALATION_SPRAY | Freq: Once | RESPIRATORY_TRACT | Status: AC
  Administered 2019-01-24: 8 via RESPIRATORY_TRACT
  Filled 2019-01-24: qty 6.7

## 2019-01-24 MED ORDER — POLYETHYLENE GLYCOL 3350 17 G PO PACK: 17.0000 g | PACK | Freq: Every day | ORAL | Status: DC | PRN

## 2019-01-24 MED ORDER — SODIUM CHLORIDE 0.9 % IV SOLN: 250.0000 mL | INTRAVENOUS | Status: DC | PRN

## 2019-01-24 MED ORDER — IPRATROPIUM BROMIDE HFA 17 MCG/ACT IN AERS
2.0000 | INHALATION_SPRAY | Freq: Once | RESPIRATORY_TRACT | Status: AC
  Administered 2019-01-24: 2 via RESPIRATORY_TRACT
  Filled 2019-01-24: qty 12.9

## 2019-01-24 MED ORDER — PHENOL 1.4 % MT LIQD
1.0000 | OROMUCOSAL | Status: DC | PRN
  Filled 2019-01-24: qty 177

## 2019-01-24 MED ORDER — AEROCHAMBER Z-STAT PLUS/MEDIUM MISC
1.0000 | Freq: Once | Status: AC
  Administered 2019-01-24: 1
  Filled 2019-01-24: qty 1

## 2019-01-24 MED ORDER — GUAIFENESIN-DM 100-10 MG/5ML PO SYRP
10.0000 mL | ORAL_SOLUTION | Freq: Three times a day (TID) | ORAL | Status: DC
  Administered 2019-01-25 – 2019-01-29 (×11): 10 mL via ORAL
  Filled 2019-01-24 (×11): qty 10

## 2019-01-24 MED ORDER — ZOLPIDEM TARTRATE 5 MG PO TABS: 5.0000 mg | ORAL_TABLET | Freq: Every evening | ORAL | Status: DC | PRN

## 2019-01-24 MED ORDER — VITAMIN C 500 MG PO TABS
500.0000 mg | ORAL_TABLET | Freq: Every day | ORAL | Status: DC
  Administered 2019-01-25 – 2019-01-29 (×5): 500 mg via ORAL
  Filled 2019-01-24 (×5): qty 1

## 2019-01-24 MED ORDER — HYDROCOD POLST-CPM POLST ER 10-8 MG/5ML PO SUER: 5.0000 mL | Freq: Three times a day (TID) | ORAL | Status: DC

## 2019-01-24 MED ORDER — GUAIFENESIN-DM 100-10 MG/5ML PO SYRP: 10.0000 mL | ORAL_SOLUTION | Freq: Four times a day (QID) | ORAL | Status: DC

## 2019-01-24 MED ORDER — SODIUM CHLORIDE 0.9 % IV SOLN
200.0000 mg | Freq: Once | INTRAVENOUS | Status: AC
  Administered 2019-01-24: 200 mg via INTRAVENOUS
  Filled 2019-01-24: qty 40

## 2019-01-24 MED ORDER — HYDROCOD POLST-CPM POLST ER 10-8 MG/5ML PO SUER
5.0000 mL | Freq: Three times a day (TID) | ORAL | Status: DC | PRN
  Administered 2019-01-26 – 2019-01-28 (×2): 5 mL via ORAL
  Filled 2019-01-24 (×2): qty 5

## 2019-01-24 MED ORDER — ONDANSETRON HCL 4 MG/2ML IJ SOLN: 4.0000 mg | Freq: Four times a day (QID) | INTRAMUSCULAR | Status: DC | PRN

## 2019-01-24 MED ORDER — SODIUM CHLORIDE 0.9 % IV SOLN
100.0000 mg | INTRAVENOUS | Status: AC
  Administered 2019-01-25 – 2019-01-28 (×4): 100 mg via INTRAVENOUS
  Filled 2019-01-24 (×4): qty 20

## 2019-01-24 MED ORDER — ZINC SULFATE 220 (50 ZN) MG PO CAPS
220.0000 mg | ORAL_CAPSULE | Freq: Every day | ORAL | Status: DC
  Administered 2019-01-25 – 2019-01-29 (×5): 220 mg via ORAL
  Filled 2019-01-24 (×5): qty 1

## 2019-01-24 MED ORDER — VITAMIN C 500 MG/5ML PO SYRP: 500.0000 mg | ORAL_SOLUTION | Freq: Every day | ORAL | Status: DC

## 2019-01-24 MED ORDER — SODIUM CHLORIDE 0.9% FLUSH
3.0000 mL | Freq: Two times a day (BID) | INTRAVENOUS | Status: DC
  Administered 2019-01-24 – 2019-01-29 (×8): 3 mL via INTRAVENOUS

## 2019-01-24 MED ORDER — ALBUTEROL SULFATE HFA 108 (90 BASE) MCG/ACT IN AERS
4.0000 | INHALATION_SPRAY | RESPIRATORY_TRACT | Status: DC | PRN
  Filled 2019-01-24: qty 6.7

## 2019-01-24 MED ORDER — SODIUM CHLORIDE 0.9% FLUSH: 3.0000 mL | INTRAVENOUS | Status: DC | PRN

## 2019-01-24 MED ORDER — ENOXAPARIN SODIUM 40 MG/0.4ML ~~LOC~~ SOLN
40.0000 mg | SUBCUTANEOUS | Status: DC
  Administered 2019-01-24: 40 mg via SUBCUTANEOUS
  Filled 2019-01-24: qty 0.4

## 2019-01-24 MED ORDER — BENZONATATE 100 MG PO CAPS
100.0000 mg | ORAL_CAPSULE | Freq: Three times a day (TID) | ORAL | Status: DC
  Administered 2019-01-24: 100 mg via ORAL
  Filled 2019-01-24: qty 1

## 2019-01-24 MED ORDER — METHYLPREDNISOLONE SODIUM SUCC 40 MG IJ SOLR
40.0000 mg | Freq: Three times a day (TID) | INTRAMUSCULAR | Status: DC
  Administered 2019-01-24 – 2019-01-25 (×2): 40 mg via INTRAVENOUS
  Filled 2019-01-24 (×2): qty 1

## 2019-01-24 MED ORDER — FERROUS SULFATE 325 (65 FE) MG PO TABS
325.0000 mg | ORAL_TABLET | Freq: Three times a day (TID) | ORAL | Status: DC
  Administered 2019-01-25 – 2019-01-29 (×13): 325 mg via ORAL
  Filled 2019-01-24 (×15): qty 1

## 2019-01-24 MED ORDER — IPRATROPIUM-ALBUTEROL 20-100 MCG/ACT IN AERS
1.0000 | INHALATION_SPRAY | Freq: Four times a day (QID) | RESPIRATORY_TRACT | Status: DC
  Administered 2019-01-24 – 2019-01-25 (×2): 1 via RESPIRATORY_TRACT
  Filled 2019-01-24 (×2): qty 4

## 2019-01-24 NOTE — H&P (Signed)
History and Physical    Ricardo Ward CHE:[id number] DOB: April 27, 1971 DOA: 01/24/2019  PCP: Alfonse Spruce, FNP  Patient coming from: Home  I have personally briefly reviewed patient's old medical records in [facility]  Chief Complaint: Worsening shortness of breath  HPI: Ricardo Ward is a 48 y.o. male with medical history significant of recent ER visit for shortness of breath.  Had presented with fever mild headaches arthralgias chills and a cough which is been uncontrolled with over-the-counter medications including ibuprofen and DayQuil.  Taking TheraFlu without relief.  On the third he was seen in the emergency department and discharged on azithromycin, albuterol and Tessalon which he states did not help.  Outpatient coronavirus test was obtained on the third and recorded as positive.  On the fourth we have documentation of attempts to notify the patient which were unsuccessful.  He is having significant difficulty breathing and he gets worse when he exerts himself.  The emergency department he looks pale and feels very labored.  He has no prior history of asthma he has not been wheezing.  ED Course: Hest x-ray shows worsening mild bilateral infiltrate.  He was noted to have a severely microcytic anemia.  Hemoglobin is 8.6 with an MCV in the 60s.  Oxygen levels dropped to 89% on room air and is now requiring 2 L of oxygen.  Referred to me for further evaluation and management.  Review of Systems: As per HPI otherwise all other systems reviewed and  negative.   Past Medical History:  Diagnosis Date  . Pterygium    rt eye  . Pterygium of both eyes    removal    Past Surgical History:  Procedure Laterality Date  . BACK SURGERY    . EYE SURGERY Bilateral 06   pterygium   . PTERYGIUM EXCISION Bilateral   . PTERYGIUM EXCISION Right 11/12/2013   Procedure: PTERYGIUM EXCISION WITH MITOMYCIN C AND AMNIOTIC MEMBRANE USING TISSEEL GLUE RIGHT EYE;  Surgeon: Marylynn Pearson, MD;   Location: [place];  Service: Ophthalmology;  Laterality: Right;    Social History   Social History Narrative   Needs interpreter for Spanish   Patient lives in a home with his wife and 2 children all of whom have been exposed to COVID-19 now that he is diagnosed as positive  reports that he quit smoking about 20 years ago. His smoking use included cigarettes. He has a 13.00 pack-year smoking history. He has never used smokeless tobacco. He reports that he does not drink alcohol or use drugs.  No Known Allergies  Family History  Family history unknown: Yes     Prior to Admission medications   Medication Sig Start Date End Date Taking? Authorizing Provider  azithromycin (ZITHROMAX) 250 MG tablet Take 1 tablet (250 mg total) by mouth daily. Take first 2 tablets together, then 1 every day until finished. 01/22/19  Yes Larene Pickett, PA-C  benzonatate (TESSALON) 100 MG capsule Take 1 capsule (100 mg total) by mouth every 8 (eight) hours. 01/22/19  Yes Larene Pickett, PA-C    Physical Exam:  Constitutional: NAD, calm, comfortable accessory muscle use on 2 L nasal cannula Vitals:   01/24/19 1545 01/24/19 1630 01/24/19 1645 01/24/19 1700  BP: 133/76 123/78 140/83 132/75  Pulse: 81 84 79 80  Resp: (!) 28 (!) 26 (!) 22 (!) 26  Temp:      TempSrc:      SpO2: 96% 95% 97% 97%  Weight:  Height:       Eyes: PERRL, lids and conjunctivae normal ENMT: Mucous membranes are moist. Posterior pharynx clear of any exudate or lesions.Normal dentition.  Neck: normal, supple, no masses, no thyromegaly Respiratory: . Normal respiratory effort.  Mild accessory muscle use.  Cardiovascular: Regular rate and rhythm, no murmurs / rubs / gallops. No extremity edema. 2+ pedal pulses. No carotid bruits.  Abdomen: no tenderness, no masses palpated. No hepatosplenomegaly. Bowel sounds positive.  Musculoskeletal: no clubbing / cyanosis. No joint deformity upper and lower extremities. Good ROM, no  contractures. Normal muscle tone.  Skin: no rashes, lesions, ulcers. No induration Neurologic: CN 2-12 grossly intact. Sensation intact, DTR normal. Strength 5/5 in all 4.  Psychiatric: Normal judgment and insight. Alert and oriented x 3. Normal mood.    Labs on Admission: I have personally reviewed following labs and imaging studies  CBC: Recent Labs  Lab 01/24/19 1045  WBC 8.7  NEUTROABS 6.6  HGB 8.6*  HCT 30.4*  MCV 62.3*  PLT 378*   Basic Metabolic Panel: Recent Labs  Lab 01/24/19 1045  NA 137  K 4.1  CL 102  CO2 22  GLUCOSE 133*  BUN 11  CREATININE 0.88  CALCIUM 9.4   GFR: Estimated Creatinine Clearance: 109.6 mL/min (by C-G formula based on SCr of 0.88 mg/dL).  Radiological Exams on Admission: Dg Chest Port 1 View  Result Date: 01/24/2019 CLINICAL DATA:  Shortness of breath EXAM: PORTABLE CHEST 1 VIEW COMPARISON:  Two days ago FINDINGS: Subtle, patchy bilateral lung opacity. Normal heart size. No edema, effusion, or pneumothorax. IMPRESSION: Lower lung volumes with mild bilateral infiltrate. Electronically Signed   By: Monte Fantasia M.D.   On: 01/24/2019 10:59      Assessment/Plan Principal Problem:   Pneumonia due to COVID-19 virus Active Problems:   Microcytic anemia   Hypoxemia    1.  Pneumonia due to COVID-19 virus: Chest x-ray shows worsening.  Patient now with hypoxemia requiring oxygen supplementation given the severity of his disease and the progression will place him in the hospital and monitor him for improvement.  Continues to be somewhat labored and has bilateral infiltrates.  Concerned that he will decompensate very easily.  Quiring oxygen supplementation.  2.  Microcytic anemia: You have patient CBC shows that he has been microcytic since 2015 which is the first CBC we had on him.  Prior hemoglobin electrophoresis or evaluation for his anemia.  I have ordered a hemoglobin electrophoresis and iron studies to determine whether this is a  thalassemia versus an anemia associated with iron deficiency.  3.  Hypoxemia: Patient is requiring 2 L nasal cannula oxygen supplementation he does not use any oxygen regularly he will need very close monitoring given the progression of disease of coronavirus.  4.  Close contacts: Patient's family needs to be notified regarding testing.  I attempted to discuss this with his wife recommended she either go to her doctor and the children's doctor or to go to a free testing site.  DVT prophylaxis: Lovenox Code Status: Full Family Communication: I spoke at length to patient's next of kin Maren Reamer using the patient's phone when I was in the room with him.  Disposition Plan: Likely home when improved Consults called: None Admission status: Inpatient  Admit - It is my clinical opinion that admission to INPATIENT is reasonable and necessary because of the expectation that this patient will require hospital care that crosses at least 2 midnights to treat this condition based on the  medical complexity of the problems presented.  Given the aforementioned information, the predictability of an adverse outcome is felt to be significant.  Lady Deutscher MD FACP [facility] Pager [phone]  How to contact the [facility] Attending or Consulting provider [place] or covering provider during after hours [place], for this patient?  1. Check the care team in [facility] and look for a) attending/consulting [facility] provider listed and b) the [facility] team listed 2. Log into [email or web address] and use [place]'s universal password to access. If you do not have the password, please contact the hospital operator. 3. Locate the [facility] provider you are looking for under [facility] and page to a number that you can be directly reached. 4. If you still have difficulty reaching the provider, please page the [facility] (Director on Call) for the Hospitalists listed on amion for assistance.  If 7PM-7AM, please contact night-coverage  [email or web address] Password [facility]  01/24/2019, 5:16 PM

## 2019-01-24 NOTE — ED Notes (Addendum)
Patient oxygen at 90% with a good waveform. Applied 3L oxygen via [place]. Currently at  96%.

## 2019-01-24 NOTE — Progress Notes (Signed)
MEDICATION RELATED CONSULT NOTE - INITIAL   Pharmacy Consult for remdesivir Indication: COVID-19  No Known Allergies  Patient Measurements: Height: 5\' 6"  (167.6 cm) Weight: 209 lb 7 oz (95 kg) IBW/kg (Calculated) : 63.8  Vital Signs: Temp: 98.3 F (36.8 C) (06/05 2150) Temp Source: Oral (06/05 2150) BP: 117/64 (06/05 2100) Pulse Rate: 79 (06/05 2100) Intake/Output from previous day: No intake/output data recorded. Intake/Output from this shift: No intake/output data recorded.  Labs: Recent Labs    01/24/19 1045 01/24/19 1705  WBC 8.7 6.8  HGB 8.6* 7.8*  HCT 30.4* 27.6*  PLT 455* 476*  CREATININE 0.88 0.68   Estimated Creatinine Clearance: 120.5 mL/min (by C-G formula based on SCr of 0.68 mg/dL).   Microbiology: Recent Results (from the past 720 hour(s))  Novel Coronavirus, NAA (hospital order; send-out to ref lab)     Status: Abnormal   Collection Time: 01/22/19  3:18 AM  Result Value Ref Range Status   SARS-CoV-2, NAA DETECTED (A) NOT DETECTED Final    Comment: RESULT CALLED TO, READ BACK BY AND VERIFIED WITH: NOTIFIED LORI BERDICK VIA EMAIL 01/23/19 D. VANHOOK (NOTE) This test was developed and its performance characteristics determined by [employer]. This test has not been FDA cleared or approved. This test has been authorized by FDA under an Emergency Use Authorization (EUA). This test is only authorized for the duration of time the declaration that circumstances exist justifying the authorization of the emergency use of in vitro diagnostic tests for detection of SARS-CoV-2 virus and/or diagnosis of COVID-19 infection under section 564(b)(1) of the Act, 21 U.S.C. 270JJK-0(X)(3), unless the authorization is terminated or revoked sooner. When diagnostic testing is negative, the possibility of a false negative result should be considered in the context of a patient's recent exposures and the presence of clinical signs and symptoms consistent with  COVID-19. An individual without symptoms of COVID-19 and who is not shedd ing SARS-CoV-2 virus would expect to have a negative (not detected) result in this assay. Performed At: [facility] [address] [place], [place] [id number] Rush Farmer MD IR:[id number]    [place]  Final    Comment: Performed at [facility], [place] [address] [place], [place] [address]    Medical History: Past Medical History:  Diagnosis Date  . Pterygium    rt eye  . Pterygium of both eyes    removal   Assessment: 48 yo M presents with worsening SOB. Diagnosed with COVID-19. Per chart, sats have dropped to 89% and is now on  and concern for further decline. CXR shows infiltrates.  Goal of Therapy:  Improvement in clinical picture  Plan:  Give remdesivir 200mg  IV x 1, then start remdesivir 100mg  IV x 4 days F/U ALT  Elenor Quinones, PharmD, BCPS, BCIDP Clinical Pharmacist 01/24/2019 10:19 PM

## 2019-01-24 NOTE — Progress Notes (Signed)
PROGRESS NOTE    Patient: Ricardo Ward                            PCP: Alfonse Spruce, FNP                    DOB: 08/29/1970            DOA: 01/24/2019 NIO:[id number]             DOS: 01/24/2019, 11:04 PM   LOS: 0 days   Date of Service: The patient was seen and examined on 01/24/2019  Subjective:   Patient arrived GVC,  Complaining of shortness of breath, O2 sat improved, on 2 L of oxygen Persistently coughing.  Afebrile, tachypneic.    Brief Narrative:   Ricardo Ward is a 48 y.o. male with medical history significant of recent ER visit for shortness of breath.  Had presented with fever mild headaches arthralgias chills and a cough which is been uncontrolled with over-the-counter medications including ibuprofen and DayQuil.  Taking TheraFlu without relief.  On the third he was seen in the emergency department and discharged on azithromycin, albuterol and Tessalon which he states did not help.  Outpatient coronavirus test was obtained on the third and recorded as positive.  On the fourth we have documentation of attempts to notify the patient which were unsuccessful.  He is having significant difficulty breathing and he gets worse when he exerts himself.  The emergency department he looks pale and feels very labored.  He has no prior history of asthma he has not been wheezing.  Principal Problem:   Pneumonia due to COVID-19 virus Active Problems:   Microcytic anemia   Hypoxemia   Assessment & Plan:   Acute respiratory distress/hypoxia/due to pneumonia/SARS-CoV-2 -Additional labs ordered such as procalcitonin level,  -Pharmacy consulted for Ramdesivir  -Continue O2 via his cannula, Combivent inhaler scheduled, along with as needed albuterol -Initiated IV steroids, vitamin C zinc supplements, -Currently afebrile, WBC 6.8, pending procalcitonin level, withholding antibiotics at this time. -we will follow-up with labs, -Monitor closely  Acute on chronic iron deficiency  microcytic anemia -Dropping, H&H closely, no signs of bleeding -Revealing low iron -no iron supplement initiated -Hemoglobin electrophoresis has been ordered we will follow accordingly  DVT prophylaxis:  Lovenox Code Status:   Full Family Communication:  No family member present at bedside   Disposition Plan: Likely home when improved Consults called: None Admission status: Inpatient     Procedures:   No admission procedures for hospital encounter.     Antimicrobials:  Anti-infectives (From admission, onward)   Start     Dose/Rate Route Frequency Ordered Stop   01/25/19 2200  remdesivir 100 mg in sodium chloride 0.9 % 230 mL IVPB     100 mg 500 mL/hr over 30 Minutes Intravenous Every 24 hours 01/24/19 2213 01/29/19 2159   01/24/19 2300  remdesivir 200 mg in sodium chloride 0.9 % 210 mL IVPB     200 mg 500 mL/hr over 30 Minutes Intravenous Once 01/24/19 2213         Medication:  . AeroChamber Plus Flo-Vu Large      . enoxaparin (LOVENOX) injection  40 mg Subcutaneous Q24H  . [START ON 01/25/2019] ferrous sulfate  325 mg Oral TID WC  . [START ON 01/25/2019] guaiFENesin-dextromethorphan  10 mL Oral Q8H  . Ipratropium-Albuterol  1 puff Inhalation Q6H  . methylPREDNISolone (SOLU-MEDROL) injection  40 mg Intravenous  Q8H  . sodium chloride flush  3 mL Intravenous Q12H  . [START ON 01/25/2019] vitamin C  500 mg Oral Daily  . [START ON 01/25/2019] zinc sulfate  220 mg Oral Daily    sodium chloride, acetaminophen, chlorpheniramine-HYDROcodone, HYDROcodone-acetaminophen, ondansetron **OR** ondansetron (ZOFRAN) IV, phenol, polyethylene glycol, sodium chloride flush, zolpidem     Objective:   Vitals:   01/24/19 2045 01/24/19 2100 01/24/19 2150 01/24/19 2240  BP: 114/67 117/64  124/66  Pulse: 79 79  91  Resp: 17 (!) 23  20  Temp:   98.3 F (36.8 C) 98.6 F (37 C)  TempSrc:   Oral Oral  SpO2: 97% 99%  99%  Weight:      Height:       No intake or output data in the 24 hours  ending 01/24/19 2304 Filed Weights   01/24/19 0943  Weight: 95 kg     Examination:   Physical Exam  Constitution: Alert, continues to complain of shortness of breath Psychiatric: Normal and stable mood and affect, cognition intact,   HEENT: Normocephalic, PERRL, otherwise with in Normal limits  Chest:Chest symmetric Cardio vascular:  S1/S2, RRR, No murmure, No Rubs or Gallops  pulmonary: Diffuse rhonchi, mildly labored breathing,, negative wheezes / crackles Abdomen: Soft, non-tender, non-distended, bowel sounds,no masses, no organomegaly Muscular skeletal: Limited exam - in bed, able to move all 4 extremities, Normal strength,  Neuro: CNII-XII intact. , normal motor and sensation, reflexes intact  Extremities: No pitting edema lower extremities, +2 pulses  Skin: Dry, warm to touch, negative for any Rashes, No open wounds Wounds: per nursing documentation  LABs:  CBC Latest Ref Rng & Units 01/24/2019 01/24/2019 12/28/2016  WBC 4.0 - 10.5 K/uL 6.8 8.7 6.6  Hemoglobin 13.0 - 17.0 g/dL 7.8(L) 8.6(L) 9.0(L)  Hematocrit 39.0 - 52.0 % 27.6(L) 30.4(L) 31.9(L)  Platelets 150 - 400 K/uL 476(H) 455(H) 419(H)   CMP Latest Ref Rng & Units 01/24/2019 01/24/2019 12/28/2016  Glucose 70 - 99 mg/dL - 133(H) 77  BUN 6 - 20 mg/dL - 11 14  Creatinine 0.61 - 1.24 mg/dL 0.68 0.88 0.82  Sodium 135 - 145 mmol/L - 137 138  Potassium 3.5 - 5.1 mmol/L - 4.1 4.1  Chloride 98 - 111 mmol/L - 102 101  CO2 22 - 32 mmol/L - 22 22  Calcium 8.9 - 10.3 mg/dL - 9.4 9.4  Total Protein 6.0 - 8.5 g/dL - - 7.5  Total Bilirubin 0.0 - 1.2 mg/dL - - <0.2  Alkaline Phos 39 - 117 IU/L - - 82  AST 0 - 40 IU/L - - 18  ALT 0 - 44 IU/L - - 16        SIGNED: Deatra James, MD, FACP, FHM. [facility],  Pager [phone]  If 7PM-7AM, please contact night-coverage [email or web address] Ota [facility] 01/24/2019, 11:04 PM

## 2019-01-24 NOTE — ED Triage Notes (Signed)
Pt is Covid (+) per 01/22/2019 . Pt here for increase SOB.

## 2019-01-24 NOTE — ED Provider Notes (Signed)
[facility] EMERGENCY DEPARTMENT Provider Note   CSN: [id number] Arrival date & time: 01/24/19  7341    History   Chief Complaint Chief Complaint  Patient presents with  . Shortness of Breath    HPI Ricardo Ward is a 48 y.o. male.  Who presents the emergency department with chief complaint of shortness of breath.  The patient was seen for similar symptoms on 01/22/2019 but has had worsening of his symptoms.  He was tested for coronavirus and is positive.  He states that he has had fever, mild headaches, arthralgias, chills and a cough which has been uncontrolled on over-the-counter medications including ibuprofen and DayQuil.  He has also taken TheraFlu without relief.  He was discharged on azithromycin, albuterol and Tessalon which she states did not help.  Patient states that he is having significant difficulty breathing, it is worse when he exerts himself.  He feels very labored.  He denies a history of asthma.  He has not been wheezing.     HPI  Past Medical History:  Diagnosis Date  . Pterygium    rt eye  . Pterygium of both eyes    removal    Patient Active Problem List   Diagnosis Date Noted  . Knee pain, right 03/05/2017  . Plantar fasciitis of left foot 03/05/2017  . 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 BACK SURGERY    . EYE SURGERY Bilateral 06   pterygium   . PTERYGIUM EXCISION Bilateral   . PTERYGIUM EXCISION Right 11/12/2013   Procedure: PTERYGIUM EXCISION WITH MITOMYCIN C AND AMNIOTIC MEMBRANE USING TISSEEL GLUE RIGHT EYE;  Surgeon: Marylynn Pearson, MD;  Location: [facility];  Service: Ophthalmology;  Laterality: Right;        Home Medications    Prior to Admission medications   Medication Sig Start Date End Date Taking? Authorizing Provider  azithromycin (ZITHROMAX) 250 MG tablet Take 1 tablet (250 mg total) by mouth daily. Take first 2 tablets together, then 1 every day until finished.  01/22/19  Yes Larene Pickett, PA-C  benzonatate (TESSALON) 100 MG capsule Take 1 capsule (100 mg total) by mouth every 8 (eight) hours. 01/22/19  Yes Larene Pickett, PA-C    Family History History reviewed. No pertinent family history.  Social History Social History   Tobacco Use  . Smoking status: Former Smoker    Packs/day: 1.00    Years: 13.00    Pack years: 13.00    Types: Cigarettes    Last attempt to quit: 12/10/1998    Years since quitting: 20.1  . Smokeless tobacco: Never Used  Substance Use Topics  . Alcohol use: No  . Drug use: No     Allergies   Patient has no known allergies.   Review of Systems Review of Systems  Ten systems reviewed and are negative for acute change, except as noted in the HPI.   Physical Exam Updated Vital Signs BP 133/76   Pulse 92   Temp 99.2 F (37.3 C) (Oral)   Resp 18   Ht 5\' 6"  (1.676 m)   Wt 95 kg   SpO2 93%   BMI 33.80 kg/m   Physical Exam Vitals signs and nursing note reviewed.  Constitutional:      General: He is not in acute distress.    Appearance: He is well-developed. He is ill-appearing. He is not toxic-appearing or diaphoretic.  HENT:  Head: Normocephalic and atraumatic.  Eyes:     General: No scleral icterus.    Conjunctiva/sclera: Conjunctivae normal.  Neck:     Musculoskeletal: Normal range of motion and neck supple.  Cardiovascular:     Rate and Rhythm: Normal rate and regular rhythm.     Heart sounds: Normal heart sounds.  Pulmonary:     Effort: Tachypnea present. No respiratory distress.     Breath sounds: Normal breath sounds. No stridor. No wheezing.     Comments: Patient's breathing is labored.  His sentences are interrupted to catch his breath.  His breathing is noisy however I do not hear abnormal breath sounds on auscultation.  He is tachypneic with good air movement.  On room air his oxygen saturation is 92 to 93%. Abdominal:     Palpations: Abdomen is soft.     Tenderness: There is no  abdominal tenderness.  Skin:    General: Skin is warm and dry.  Neurological:     Mental Status: He is alert.  Psychiatric:        Behavior: Behavior normal.      ED Treatments / Results  Labs (all labs ordered are listed, but only abnormal results are displayed) Labs Reviewed  BASIC METABOLIC PANEL - Abnormal; Notable for the following components:      Result Value   Glucose, Bld 133 (*)    All other components within normal limits  CBC WITH DIFFERENTIAL/PLATELET - Abnormal; Notable for the following components:   Hemoglobin 8.6 (*)    HCT 30.4 (*)    MCV 62.3 (*)    MCH 17.6 (*)    MCHC 28.3 (*)    RDW 20.7 (*)    Platelets 455 (*)    All other components within normal limits  BRAIN NATRIURETIC PEPTIDE    EKG None  Radiology Dg Chest Port 1 View  Result Date: 01/24/2019 CLINICAL DATA:  Shortness of breath EXAM: PORTABLE CHEST 1 VIEW COMPARISON:  Two days ago FINDINGS: Subtle, patchy bilateral lung opacity. Normal heart size. No edema, effusion, or pneumothorax. IMPRESSION: Lower lung volumes with mild bilateral infiltrate. Electronically Signed   By: Monte Fantasia M.D.   On: 01/24/2019 10:59    Procedures .Critical Care Performed by: Margarita Mail, PA-C Authorized by: Margarita Mail, PA-C   Critical care provider statement:    Critical care time (minutes):  40   Critical care was necessary to treat or prevent imminent or life-threatening deterioration of the following conditions:  Respiratory failure   Critical care was time spent personally by me on the following activities:  Discussions with consultants, evaluation of patient's response to treatment, examination of patient, ordering and performing treatments and interventions, ordering and review of laboratory studies, ordering and review of radiographic studies, pulse oximetry, re-evaluation of patient's condition, obtaining history from patient or surrogate and review of old charts   (including critical  care time)  Medications Ordered in ED Medications  albuterol (VENTOLIN HFA) 108 (90 Base) MCG/ACT inhaler 8 puff (has no administration in time range)  aerochamber Z-Stat Plus/medium 1 each (has no administration in time range)  ipratropium (ATROVENT HFA) inhaler 2 puff (has no administration in time range)     Initial Impression / Assessment and Plan / ED Course  I have reviewed the triage vital signs and the nursing notes.  Pertinent labs & imaging results that were available during my care of the patient were reviewed by me and considered in my medical decision making (see chart  for details).        CC: Shortness of breath VS: BP 135/86 (BP Location: Left Arm)   Pulse 86   Temp 98.1 F (36.7 C) (Oral)   Resp 20   Ht 5\' 6"  (1.676 m)   Wt 95 kg   SpO2 92%   BMI 33.80 kg/m  FM:[id number] is gathered by patient and emr. DDX: The emergent differential diagnosis for shortness of breath includes, but is not limited to, Pulmonary edema, bronchoconstriction, Pneumonia, Pulmonary embolism, Pneumotherax/ Hemothorax, Dysrythmia, ACS.  Labs: I reviewed the labs which shows no leukocytoss, acute on chronic microcytic anemia, no sig metabolic abnormalities Imaging: I personally reviewed the images ( 1 v cxr) which show(s) BL infiltrates EKG: DJT:TSVXBLT with acute hypoxic respiratory failure secondary to COVID-19 infection. Now requiring oxygen. Will need admission. Discussed abnormal hematological findings with Admitting physician. DO not think the patient needs blood transfusion at this time Patient disposition: admit Patient condition: stable. The patient appears reasonably stabilized for admission considering the current resources, flow, and capabilities available in the ED at this time, and I doubt any other [facility] requiring further screening and/or treatment in the ED prior to admission.   Final Clinical Impressions(s) / ED Diagnoses   Final diagnoses:  COVID-19 virus infection   Shortness of breath  Hypoxia  Microcytic anemia    ED Discharge Orders    None       Margarita Mail, PA-C 01/28/19 1125    Maudie Flakes, MD 01/29/19 1319

## 2019-01-24 NOTE — ED Notes (Signed)
Patient ambulated in the room with no complaints and steady gait. Oxygen saturation at 96%.

## 2019-01-24 NOTE — ED Notes (Signed)
ED TO INPATIENT HANDOFF REPORT  ED Nurse Name and Phone #: [phone]  S Name/Age/Gender Ricardo Ward 48 y.o. male Room/Bed: 012C/012C  Code Status   Code Status: Full Code  Home/SNF/Other Home Patient oriented to: self, place, time and situation Is this baseline? Yes   Triage Complete: Triage complete  Chief Complaint cant breathe/weakness/cough  Triage Note Pt is Covid (+) per 01/22/2019 . Pt here for increase SOB.   Allergies No Known Allergies  Level of Care/Admitting Diagnosis ED Disposition    ED Disposition Condition [facility] Area: [place] [[id number]]  Level of Care: Med-Surg [16]  Covid Evaluation: Confirmed COVID Positive  Isolation Risk Level: Low Risk/Droplet (Less than 4L [place] supplementation)  Diagnosis: Pneumonia due to COVID-19 virus [[id number]]  Admitting Physician: Lady Deutscher [[id number]]  Attending Physician: Lady Deutscher [[id number]]  Estimated length of stay: 3 - 4 days  Certification:: I certify this patient will need inpatient services for at least 2 midnights  PT Class (Do Not Modify): Inpatient [101]  PT Acc Code (Do Not Modify): Private [1]       B Medical/Surgery History Past Medical History:  Diagnosis Date  . Pterygium    rt eye  . Pterygium of both eyes    removal   Past Surgical History:  Procedure Laterality Date  . BACK SURGERY    . EYE SURGERY Bilateral 06   pterygium   . PTERYGIUM EXCISION Bilateral   . PTERYGIUM EXCISION Right 11/12/2013   Procedure: PTERYGIUM EXCISION WITH MITOMYCIN C AND AMNIOTIC MEMBRANE USING TISSEEL GLUE RIGHT EYE;  Surgeon: Marylynn Pearson, MD;  Location: [place];  Service: Ophthalmology;  Laterality: Right;     A IV Location/Drains/Wounds Patient Lines/Drains/Airways Status   Active Line/Drains/Airways    Name:   Placement date:   Placement time:   Site:   Days:   Peripheral IV 01/24/19 Right Antecubital   01/24/19    1123    Antecubital   less than 1           Intake/Output Last 24 hours No intake or output data in the 24 hours ending 01/24/19 1727  Labs/Imaging Results for orders placed or performed during the hospital encounter of 01/24/19 (from the past 48 hour(s))  Basic metabolic panel     Status: Abnormal   Collection Time: 01/24/19 10:45 AM  Result Value Ref Range   Sodium 137 135 - 145 mmol/L   Potassium 4.1 3.5 - 5.1 mmol/L   Chloride 102 98 - 111 mmol/L   CO2 22 22 - 32 mmol/L   Glucose, Bld 133 (H) 70 - 99 mg/dL   BUN 11 6 - 20 mg/dL   Creatinine, Ser 0.88 0.61 - 1.24 mg/dL   Calcium 9.4 8.9 - 10.3 mg/dL   GFR calc non Af Amer >60 >60 mL/min   GFR calc Af Amer >60 >60 mL/min   Anion gap 13 5 - 15    Comment: Performed at [facility], [place] [address] [place], [place] [address]  CBC with Differential     Status: Abnormal   Collection Time: 01/24/19 10:45 AM  Result Value Ref Range   WBC 8.7 4.0 - 10.5 K/uL   RBC 4.88 4.22 - 5.81 MIL/uL   Hemoglobin 8.6 (L) 13.0 - 17.0 g/dL    Comment: Reticulocyte Hemoglobin testing may be clinically indicated, consider ordering this additional test [id number]    HCT 30.4 (L) 39.0 - 52.0 %  MCV 62.3 (L) 80.0 - 100.0 fL   MCH 17.6 (L) 26.0 - 34.0 pg   MCHC 28.3 (L) 30.0 - 36.0 g/dL   RDW 20.7 (H) 11.5 - 15.5 %   Platelets 455 (H) 150 - 400 K/uL    Comment: REPEATED TO VERIFY   nRBC 0.0 0.0 - 0.2 %   Neutrophils Relative % 76 %   Neutro Abs 6.6 1.7 - 7.7 K/uL   Lymphocytes Relative 11 %   Lymphs Abs 1.0 0.7 - 4.0 K/uL   Monocytes Relative 8 %   Monocytes Absolute 0.7 0.1 - 1.0 K/uL   Eosinophils Relative 4 %   Eosinophils Absolute 0.4 0.0 - 0.5 K/uL   Basophils Relative 0 %   Basophils Absolute 0.0 0.0 - 0.1 K/uL   Immature Granulocytes 1 %   Abs Immature Granulocytes 0.06 0.00 - 0.07 K/uL   Ovalocytes PRESENT     Comment: Performed at [facility], [address], [place], [place] [address]  Brain natriuretic peptide     Status: None   Collection Time:  01/24/19 10:45 AM  Result Value Ref Range   B Natriuretic Peptide 31.6 0.0 - 100.0 pg/mL    Comment: Performed at [facility] [address], [place], [place] [address]   Dg Chest Port 1 View  Result Date: 01/24/2019 CLINICAL DATA:  Shortness of breath EXAM: PORTABLE CHEST 1 VIEW COMPARISON:  Two days ago FINDINGS: Subtle, patchy bilateral lung opacity. Normal heart size. No edema, effusion, or pneumothorax. IMPRESSION: Lower lung volumes with mild bilateral infiltrate. Electronically Signed   By: Monte Fantasia M.D.   On: 01/24/2019 10:59    Pending Labs Unresulted Labs (From admission, onward)    Start     Ordered   01/31/19 0500  Creatinine, serum  (enoxaparin (LOVENOX)    CrCl >/= 30 ml/min)  Weekly,   R    Comments:  while on enoxaparin therapy    01/24/19 1705   01/25/19 0500  CBC with Differential/Platelet  Daily,   R     01/24/19 1705   01/25/19 0500  Comprehensive metabolic panel  Daily,   R     01/24/19 1705   01/25/19 0500  C-reactive protein  Daily,   R     01/24/19 1705   01/25/19 0500  CK  Daily,   R     01/24/19 1705   01/25/19 0500  D-dimer, quantitative (not at [facility])  Daily,   R     01/24/19 1705   01/25/19 0500  Ferritin  Daily,   R     01/24/19 1705   01/25/19 0500  Interleukin-6, Plasma  Daily,   R     01/24/19 1705   01/25/19 0500  Magnesium  Daily,   R     01/24/19 1705   01/25/19 0500  Phosphorus  Daily,   R     01/24/19 1705   01/25/19 0500  Triglycerides  Daily,   R     01/24/19 1705   01/24/19 1704  Hemoglobinopathy evaluation  Once,   R     01/24/19 1705   01/24/19 1702  Vitamin B12  (Anemia Panel (PNL))  Once,   R     01/24/19 1705   01/24/19 1702  Folate  (Anemia Panel (PNL))  Once,   R     01/24/19 1705   01/24/19 1702  Iron and TIBC  (Anemia Panel (PNL))  Once,   R  01/24/19 1705   01/24/19 1702  Reticulocytes  (Anemia Panel (PNL))  Once,   R     01/24/19 1705   01/24/19 1700  Procalcitonin  Once,   R     01/24/19 1705    01/24/19 1700  Sedimentation rate  Once,   R     01/24/19 1705   01/24/19 1700  Triglycerides  Once,   R     01/24/19 1705   01/24/19 1700  Troponin I - Once  Once,   R     01/24/19 1705   01/24/19 1659  C-reactive protein  Once,   R     01/24/19 1705   01/24/19 1659  Type and screen [place]  Once,   R    Comments:  [place]    01/24/19 1705   01/24/19 1659  Brain natriuretic peptide  Once,   R     01/24/19 1705   01/24/19 1659  D-dimer, quantitative (not at [facility])  Once,   R     01/24/19 1705   01/24/19 1659  Ferritin  Once,   R     01/24/19 1705   01/24/19 1659  Fibrinogen  Once,   R     01/24/19 1705   01/24/19 1659  Glucose 6 phosphate dehydrogenase  Once,   R     01/24/19 1705   01/24/19 1659  Hepatitis B surface antigen  Once,   R     01/24/19 1705   01/24/19 1659  Interleukin-6, Plasma  Once,   R     01/24/19 1705   01/24/19 1659  Lactate dehydrogenase  Once,   R     01/24/19 1705   01/24/19 1657  HIV antibody (Routine Testing)  Once,   R     01/24/19 1705   01/24/19 1657  ABO/Rh  Once,   R     01/24/19 1705   01/24/19 1657  CBC  (enoxaparin (LOVENOX)    CrCl >/= 30 ml/min)  Once,   R    Comments:  Baseline for enoxaparin therapy IF NOT ALREADY DRAWN.  Notify MD if PLT < 100 K.    01/24/19 1705   01/24/19 1657  Creatinine, serum  (enoxaparin (LOVENOX)    CrCl >/= 30 ml/min)  Once,   R    Comments:  Baseline for enoxaparin therapy IF NOT ALREADY DRAWN.    01/24/19 1705          Vitals/Pain Today's Vitals   01/24/19 1630 01/24/19 1645 01/24/19 1700 01/24/19 1715  BP: 123/78 140/83 132/75 122/69  Pulse: 84 79 80 77  Resp: (!) 26 (!) 22 (!) 26 (!) 26  Temp:      TempSrc:      SpO2: 95% 97% 97% 96%  Weight:      Height:      PainSc:        Isolation Precautions Droplet and Contact precautions  Medications Medications  AeroChamber Plus Flo-Vu Large MISC (has no administration in time range)  benzonatate  (TESSALON) capsule 100 mg (has no administration in time range)  enoxaparin (LOVENOX) injection 40 mg (has no administration in time range)  sodium chloride flush (NS) 0.9 % injection 3 mL (has no administration in time range)  sodium chloride flush (NS) 0.9 % injection 3 mL (has no administration in time range)  0.9 %  sodium chloride infusion (has no administration in time range)  acetaminophen (TYLENOL) tablet 650 mg (has  no administration in time range)  HYDROcodone-acetaminophen (NORCO/VICODIN) 5-325 MG per tablet 1-2 tablet (has no administration in time range)  zolpidem (AMBIEN) tablet 5 mg (has no administration in time range)  polyethylene glycol (MIRALAX / GLYCOLAX) packet 17 g (has no administration in time range)  ondansetron (ZOFRAN) tablet 4 mg (has no administration in time range)    Or  ondansetron (ZOFRAN) injection 4 mg (has no administration in time range)  albuterol (VENTOLIN HFA) 108 (90 Base) MCG/ACT inhaler 4 puff (has no administration in time range)  albuterol (VENTOLIN HFA) 108 (90 Base) MCG/ACT inhaler 8 puff (8 puffs Inhalation Given 01/24/19 1240)  aerochamber Z-Stat Plus/medium 1 each (1 each Other Given 01/24/19 1354)  ipratropium (ATROVENT HFA) inhaler 2 puff (2 puffs Inhalation Given 01/24/19 1353)    Mobility walks Low fall risk   Focused Assessments    R Recommendations: See Admitting Provider Note  Report given to:   Additional Notes:

## 2019-01-25 DIAGNOSIS — J9621 Acute and chronic respiratory failure with hypoxia: Secondary | ICD-10-CM

## 2019-01-25 DIAGNOSIS — D509 Iron deficiency anemia, unspecified: Secondary | ICD-10-CM

## 2019-01-25 LAB — HEPATITIS B SURFACE ANTIGEN: Hepatitis B Surface Ag: NEGATIVE

## 2019-01-25 LAB — ABO/RH: ABO/RH(D): O POS

## 2019-01-25 LAB — CBC WITH DIFFERENTIAL/PLATELET
Abs Immature Granulocytes: 0.03 10*3/uL (ref 0.00–0.07)
Basophils Absolute: 0 10*3/uL (ref 0.0–0.1)
Basophils Relative: 0 %
Eosinophils Absolute: 0 10*3/uL (ref 0.0–0.5)
Eosinophils Relative: 0 %
HCT: 28.1 % — ABNORMAL LOW (ref 39.0–52.0)
Hemoglobin: 7.7 g/dL — ABNORMAL LOW (ref 13.0–17.0)
Immature Granulocytes: 1 %
Lymphocytes Relative: 10 %
Lymphs Abs: 0.4 10*3/uL — ABNORMAL LOW (ref 0.7–4.0)
MCH: 17.1 pg — ABNORMAL LOW (ref 26.0–34.0)
MCHC: 27.4 g/dL — ABNORMAL LOW (ref 30.0–36.0)
MCV: 62.3 fL — ABNORMAL LOW (ref 80.0–100.0)
Monocytes Absolute: 0.1 10*3/uL (ref 0.1–1.0)
Monocytes Relative: 2 %
Neutro Abs: 3.4 10*3/uL (ref 1.7–7.7)
Neutrophils Relative %: 87 %
Platelets: 496 10*3/uL — ABNORMAL HIGH (ref 150–400)
RBC: 4.51 MIL/uL (ref 4.22–5.81)
RDW: 20.4 % — ABNORMAL HIGH (ref 11.5–15.5)
WBC: 3.9 10*3/uL — ABNORMAL LOW (ref 4.0–10.5)
nRBC: 0 % (ref 0.0–0.2)

## 2019-01-25 LAB — COMPREHENSIVE METABOLIC PANEL
ALT: 31 U/L (ref 0–44)
AST: 47 U/L — ABNORMAL HIGH (ref 15–41)
Albumin: 3.6 g/dL (ref 3.5–5.0)
Alkaline Phosphatase: 80 U/L (ref 38–126)
Anion gap: 13 (ref 5–15)
BUN: 15 mg/dL (ref 6–20)
CO2: 21 mmol/L — ABNORMAL LOW (ref 22–32)
Calcium: 8.7 mg/dL — ABNORMAL LOW (ref 8.9–10.3)
Chloride: 102 mmol/L (ref 98–111)
Creatinine, Ser: 0.73 mg/dL (ref 0.61–1.24)
GFR calc Af Amer: >60 mL/min (ref >60)
GFR calc non Af Amer: >60 mL/min (ref >60)
Glucose, Bld: 228 mg/dL — ABNORMAL HIGH (ref 70–99)
Potassium: 3.8 mmol/L (ref 3.5–5.1)
Sodium: 136 mmol/L (ref 135–145)
Total Bilirubin: 0.4 mg/dL (ref 0.3–1.2)
Total Protein: 8 g/dL (ref 6.5–8.1)

## 2019-01-25 LAB — CK: Total CK: 93 U/L (ref 49–397)

## 2019-01-25 LAB — TRIGLYCERIDES: Triglycerides: 71 mg/dL (ref <150)

## 2019-01-25 LAB — TYPE AND SCREEN
ABO/RH(D): O POS
Antibody Screen: NEGATIVE

## 2019-01-25 LAB — C-REACTIVE PROTEIN: CRP: 6.3 mg/dL — ABNORMAL HIGH (ref <1.0)

## 2019-01-25 LAB — FERRITIN: Ferritin: 18 ng/mL — ABNORMAL LOW (ref 24–336)

## 2019-01-25 LAB — PHOSPHORUS: Phosphorus: 3.9 mg/dL (ref 2.5–4.6)

## 2019-01-25 LAB — PROCALCITONIN: Procalcitonin: <0.1 ng/mL

## 2019-01-25 LAB — D-DIMER, QUANTITATIVE: D-Dimer, Quant: 2.56 ug/mL-FEU — ABNORMAL HIGH (ref 0.00–0.50)

## 2019-01-25 LAB — GLUCOSE 6 PHOSPHATE DEHYDROGENASE
G6PDH: 17.8 U/g{Hb} — ABNORMAL HIGH (ref 4.6–13.5)
Hemoglobin: 7.9 g/dL — ABNORMAL LOW (ref 13.0–17.7)

## 2019-01-25 LAB — MAGNESIUM: Magnesium: 2 mg/dL (ref 1.7–2.4)

## 2019-01-25 LAB — HIV ANTIBODY (ROUTINE TESTING W REFLEX): HIV Screen 4th Generation wRfx: NONREACTIVE

## 2019-01-25 MED ORDER — METHYLPREDNISOLONE SODIUM SUCC 125 MG IJ SOLR
60.0000 mg | Freq: Two times a day (BID) | INTRAMUSCULAR | Status: AC
  Administered 2019-01-25 – 2019-01-27 (×4): 60 mg via INTRAVENOUS
  Filled 2019-01-25 (×4): qty 2

## 2019-01-25 MED ORDER — METHYLPREDNISOLONE SODIUM SUCC 125 MG IJ SOLR: 60.0000 mg | Freq: Two times a day (BID) | INTRAMUSCULAR | Status: DC

## 2019-01-25 MED ORDER — IPRATROPIUM-ALBUTEROL 20-100 MCG/ACT IN AERS
1.0000 | INHALATION_SPRAY | Freq: Three times a day (TID) | RESPIRATORY_TRACT | Status: DC
  Administered 2019-01-25 – 2019-01-29 (×10): 1 via RESPIRATORY_TRACT
  Filled 2019-01-25: qty 4

## 2019-01-25 NOTE — Progress Notes (Signed)
[facility] PROGRESS NOTE    Progress Note  Ricardo Ward  OVF:[id number] DOB: [birth year] DOA: 01/24/2019 PCP: Alfonse Spruce, FNP     Brief Narrative:   Ricardo Ward is an 48 y.o. male with no significant past medical history who comes into the ED for shortness of breath, fever, mild headache and cough.  the patient corona test was obtained was positive.  Assessment/Plan:   Acute on chronic respiratory failure with hypoxia due to Pneumonia due to COVID-19 virus: Patient requiring oxygen nasal cannula to try to keep saturations above 95%. If tolerated we will try to wean off oxygen over the next day or 2. Start IV Remdesivir and IV steroids. Keep patient euvolemic, continue to prone patient for at least 16 hours a day.  COVID-19 Labs  Recent Labs    01/24/19 1705  DDIMER 3.92*  FERRITIN 21*  LDH 230*  CRP 5.5*    Lab Results  Component Value Date   SARSCOV2NAA DETECTED (A) 01/22/2019    Microcytic anemia: No signs of overt bleeding, he will need a colonoscopy as an outpatient. Ferritin is 21, will put him on oral supplementation of iron. discontinue Lovenox, start on SCDs, ambulate patient.   DVT prophylaxis: SCD Family Communication:none Disposition Plan/Barrier to D/C: unable to determine Code Status:     Code Status Orders  (From admission, onward)         Start     Ordered   01/24/19 1657  Full code  Continuous     01/24/19 1705        Code Status History    This patient has a current code status but no historical code status.        IV Access:    Peripheral IV   Procedures and diagnostic studies:   Dg Chest Port 1 View  Result Date: 01/24/2019 CLINICAL DATA:  Shortness of breath EXAM: PORTABLE CHEST 1 VIEW COMPARISON:  Two days ago FINDINGS: Subtle, patchy bilateral lung opacity. Normal heart size. No edema, effusion, or pneumothorax. IMPRESSION: Lower lung volumes with mild bilateral infiltrate. Electronically Signed   By:  Monte Fantasia M.D.   On: 01/24/2019 10:59     Medical Consultants:    None.  Anti-Infectives:   None  Subjective:    [name] he relates he still short of breath especially when he ambulates.  Objective:    Vitals:   01/24/19 2150 01/24/19 2240 01/25/19 0400 01/25/19 0816  BP:  124/66  119/69  Pulse:  91  90  Resp:  20  18  Temp: 98.3 F (36.8 C) 98.6 F (37 C) 98.3 F (36.8 C) 98.2 F (36.8 C)  TempSrc: Oral Oral Oral Oral  SpO2:  99%  95%  Weight:      Height:        Intake/Output Summary (Last 24 hours) at 01/25/2019 0818 Last data filed at 01/25/2019 0400 Gross per 24 hour  Intake -  Output 280 ml  Net -280 ml   Filed Weights   01/24/19 0943  Weight: 95 kg    Exam: General exam: In no acute distress. Respiratory system: Good air movement and clear to auscultation. Cardiovascular system: S1 & S2 heard, RRR. Gastrointestinal system: Abdomen is nondistended, soft and nontender.  Central nervous system: Alert and oriented. No focal neurological deficits. Extremities: No pedal edema. Skin: No rashes, lesions or ulcers Psychiatry: Judgement and insight appear normal. Mood & affect appropriate.     Data Reviewed:    Labs: Basic  Metabolic Panel: Recent Labs  Lab 01/24/19 1045 01/24/19 1705  NA 137  --   K 4.1  --   CL 102  --   CO2 22  --   GLUCOSE 133*  --   BUN 11  --   CREATININE 0.88 0.68  CALCIUM 9.4  --    GFR Estimated Creatinine Clearance: 120.5 mL/min (by C-G formula based on SCr of 0.68 mg/dL). Liver Function Tests: No results for input(s): AST, ALT, ALKPHOS, BILITOT, PROT, ALBUMIN in the last 168 hours. No results for input(s): LIPASE, AMYLASE in the last 168 hours. No results for input(s): AMMONIA in the last 168 hours. Coagulation profile No results for input(s): INR, PROTIME in the last 168 hours. COVID-19 Labs  Recent Labs    01/24/19 1705  DDIMER 3.92*  FERRITIN 21*  LDH 230*  CRP 5.5*    Lab Results   Component Value Date   SARSCOV2NAA DETECTED (A) 01/22/2019    CBC: Recent Labs  Lab 01/24/19 1045 01/24/19 1705  WBC 8.7 6.8  NEUTROABS 6.6  --   HGB 8.6* 7.8*  HCT 30.4* 27.6*  MCV 62.3* 62.2*  PLT 455* 476*   Cardiac Enzymes: Recent Labs  Lab 01/24/19 1705  TROPONINI <0.03   BNP (last 3 results) No results for input(s): PROBNP in the last 8760 hours. CBG: No results for input(s): GLUCAP in the last 168 hours. D-Dimer: Recent Labs    01/24/19 1705  DDIMER 3.92*   Hgb A1c: No results for input(s): HGBA1C in the last 72 hours. Lipid Profile: Recent Labs    01/24/19 1700  TRIG 116   Thyroid function studies: No results for input(s): TSH, T4TOTAL, T3FREE, THYROIDAB in the last 72 hours.  Invalid input(s): FREET3 Anemia work up: Recent Labs    01/24/19 1705 01/24/19 1900  VITAMINB12 372  --   FOLATE  --  20.8  FERRITIN 21*  --   TIBC 389  --   IRON 11*  --   RETICCTPCT 0.4  --    Sepsis Labs: Recent Labs  Lab 01/24/19 1045 01/24/19 1705 01/24/19 2304  PROCALCITON  --  <0.10 <0.10  WBC 8.7 6.8  --    Microbiology Recent Results (from the past 240 hour(s))  Novel Coronavirus, NAA (hospital order; send-out to ref lab)     Status: Abnormal   Collection Time: 01/22/19  3:18 AM  Result Value Ref Range Status   SARS-CoV-2, NAA DETECTED (A) NOT DETECTED Final    Comment: RESULT CALLED TO, READ BACK BY AND VERIFIED WITH: NOTIFIED [place] 01/23/19 D. VANHOOK (NOTE) This test was developed and its performance characteristics determined by [employer]. This test has not been FDA cleared or approved. This test has been authorized by FDA under an Emergency Use Authorization (EUA). This test is only authorized for the duration of time the declaration that circumstances exist justifying the authorization of the emergency use of in vitro diagnostic tests for detection of SARS-CoV-2 virus and/or diagnosis of COVID-19 infection under  section 564(b)(1) of the Act, 21 U.S.C. 401UUV-2(Z)(3), unless the authorization is terminated or revoked sooner. When diagnostic testing is negative, the possibility of a false negative result should be considered in the context of a patient's recent exposures and the presence of clinical signs and symptoms consistent with COVID-19. An individual without symptoms of COVID-19 and who is not shedd ing SARS-CoV-2 virus would expect to have a negative (not detected) result in this assay. Performed At: [facility]  [place], [place] [id number] Rush Farmer MD EO:[id number]    Coronavirus Source NASOPHARYNGEAL  Final    Comment: Performed at [facility], [place] [address], [place], [place] [address]     Medications:   . enoxaparin (LOVENOX) injection  40 mg Subcutaneous Q24H  . ferrous sulfate  325 mg Oral TID WC  . guaiFENesin-dextromethorphan  10 mL Oral Q8H  . Ipratropium-Albuterol  1 puff Inhalation Q6H  . methylPREDNISolone (SOLU-MEDROL) injection  40 mg Intravenous Q8H  . sodium chloride flush  3 mL Intravenous Q12H  . vitamin C  500 mg Oral Daily  . zinc sulfate  220 mg Oral Daily   Continuous Infusions: . sodium chloride    . remdesivir 100 mg in NS 250 mL       LOS: 1 day   Charlynne Cousins  [facility]  01/25/2019, 8:18 AM

## 2019-01-25 NOTE — Progress Notes (Signed)
CardioVascular Research Department and AHF Team  ReDS Research Project   Patient #: [id number]  ReDS Measurement  Right: 34 %  Left: 32 %

## 2019-01-26 LAB — CBC WITH DIFFERENTIAL/PLATELET
Abs Immature Granulocytes: 0.15 10*3/uL — ABNORMAL HIGH (ref 0.00–0.07)
Basophils Absolute: 0 10*3/uL (ref 0.0–0.1)
Basophils Relative: 0 %
Eosinophils Absolute: 0 10*3/uL (ref 0.0–0.5)
Eosinophils Relative: 0 %
HCT: 26.6 % — ABNORMAL LOW (ref 39.0–52.0)
Hemoglobin: 7.5 g/dL — ABNORMAL LOW (ref 13.0–17.0)
Immature Granulocytes: 3 %
Lymphocytes Relative: 10 %
Lymphs Abs: 0.6 10*3/uL — ABNORMAL LOW (ref 0.7–4.0)
MCH: 17.5 pg — ABNORMAL LOW (ref 26.0–34.0)
MCHC: 28.2 g/dL — ABNORMAL LOW (ref 30.0–36.0)
MCV: 62 fL — ABNORMAL LOW (ref 80.0–100.0)
Monocytes Absolute: 0.3 10*3/uL (ref 0.1–1.0)
Monocytes Relative: 4 %
Neutro Abs: 5.1 10*3/uL (ref 1.7–7.7)
Neutrophils Relative %: 83 %
Platelets: 580 10*3/uL — ABNORMAL HIGH (ref 150–400)
RBC: 4.29 MIL/uL (ref 4.22–5.81)
RDW: 20.2 % — ABNORMAL HIGH (ref 11.5–15.5)
WBC: 6 10*3/uL (ref 4.0–10.5)
nRBC: 0 % (ref 0.0–0.2)

## 2019-01-26 LAB — COMPREHENSIVE METABOLIC PANEL
ALT: 41 U/L (ref 0–44)
AST: 44 U/L — ABNORMAL HIGH (ref 15–41)
Albumin: 3.5 g/dL (ref 3.5–5.0)
Alkaline Phosphatase: 72 U/L (ref 38–126)
Anion gap: 9 (ref 5–15)
BUN: 16 mg/dL (ref 6–20)
CO2: 21 mmol/L — ABNORMAL LOW (ref 22–32)
Calcium: 8.9 mg/dL (ref 8.9–10.3)
Chloride: 106 mmol/L (ref 98–111)
Creatinine, Ser: 0.65 mg/dL (ref 0.61–1.24)
GFR calc Af Amer: >60 mL/min (ref >60)
GFR calc non Af Amer: >60 mL/min (ref >60)
Glucose, Bld: 203 mg/dL — ABNORMAL HIGH (ref 70–99)
Potassium: 4.3 mmol/L (ref 3.5–5.1)
Sodium: 136 mmol/L (ref 135–145)
Total Bilirubin: 0.3 mg/dL (ref 0.3–1.2)
Total Protein: 7.4 g/dL (ref 6.5–8.1)

## 2019-01-26 LAB — PHOSPHORUS: Phosphorus: 3.4 mg/dL (ref 2.5–4.6)

## 2019-01-26 LAB — ABO/RH: ABO/RH(D): O POS

## 2019-01-26 LAB — MAGNESIUM: Magnesium: 2.1 mg/dL (ref 1.7–2.4)

## 2019-01-26 LAB — HEMOGLOBIN A1C
Hgb A1c MFr Bld: 6.6 % — ABNORMAL HIGH (ref 4.8–5.6)
Mean Plasma Glucose: 142.72 mg/dL

## 2019-01-26 LAB — GLUCOSE, CAPILLARY
Glucose-Capillary: 144 mg/dL — ABNORMAL HIGH (ref 70–99)
Glucose-Capillary: 242 mg/dL — ABNORMAL HIGH (ref 70–99)

## 2019-01-26 LAB — CK: Total CK: 97 U/L (ref 49–397)

## 2019-01-26 LAB — FERRITIN: Ferritin: 21 ng/mL — ABNORMAL LOW (ref 24–336)

## 2019-01-26 LAB — D-DIMER, QUANTITATIVE: D-Dimer, Quant: 1.25 ug/mL-FEU — ABNORMAL HIGH (ref 0.00–0.50)

## 2019-01-26 LAB — C-REACTIVE PROTEIN: CRP: 2.7 mg/dL — ABNORMAL HIGH (ref <1.0)

## 2019-01-26 LAB — INTERLEUKIN-6, PLASMA
Interleukin-6, Plasma: 35 pg/mL — ABNORMAL HIGH (ref 0.0–12.2)
Interleukin-6, Plasma: 2.3 pg/mL (ref 0.0–12.2)

## 2019-01-26 MED ORDER — INSULIN ASPART 100 UNIT/ML ~~LOC~~ SOLN
0.0000 [IU] | Freq: Three times a day (TID) | SUBCUTANEOUS | Status: DC
  Administered 2019-01-26: 1 [IU] via SUBCUTANEOUS
  Administered 2019-01-27: 5 [IU] via SUBCUTANEOUS
  Administered 2019-01-27 (×2): 2 [IU] via SUBCUTANEOUS
  Administered 2019-01-28 (×2): 1 [IU] via SUBCUTANEOUS

## 2019-01-26 NOTE — Progress Notes (Signed)
CardioVascular Research Department and AHF Team  ReDS Research Project   Patient #: [id number]  ReDS Measurement  Right: 34 %  Left: 29 %

## 2019-01-26 NOTE — Progress Notes (Signed)
PROGRESS NOTE                                                                                                                                                                                                             Patient Demographics:    Ricardo Ward, is a 48 y.o. male, DOB - [birth year], NOB:[id number]  Outpatient Primary MD for the patient is Alfonse Spruce, FNP    LOS - 2  Chief Complaint  Patient presents with  . Shortness of Breath       Brief Narrative: Patient is a 48 y.o. male with no significant PMHx presented with acute hypoxic respiratory failure secondary to COVID-19 viral pneumonia.   Subjective:    Ricardo Ward today feels better-less shortness of breath.   Assessment  & Plan :   Acute Hypoxic Resp Failure due to Covid 19 Viral pneumonia: Improved-room air this morning.  Continue Remdesivir for total of 5 days.  COVID-19 Labs:  Recent Labs    01/24/19 1705 01/25/19 0917 01/26/19 0500  DDIMER 3.92* 2.56* 1.25*  FERRITIN 21* 18* 21*  LDH 230*  --   --   CRP 5.5* 6.3* 2.7*    Lab Results  Component Value Date   SARSCOV2NAA DETECTED (A) 01/22/2019     COVID-19 Medications: 6/5>> Remdesivir 6/5>> Solu-Medrol  Microcytic anemia: Iron deficient per anemia panel-no overt GI bleeding noted-hemoglobin stable over the past few days-will need outpatient elective colonoscopy.  ?  DM-2: Blood glucose with labs more than 200-check A1c.  Start SSI and follow.  ABG: No results found for: PHART, PCO2ART, PO2ART, HCO3, TCO2, ACIDBASEDEF, O2SAT  Condition - Stable  Family Communication  : None  Code Status :  Full Code  Diet :  Diet Order            Diet regular Room service appropriate? Yes; Fluid consistency: Thin; Fluid restriction: 1200 mL Fluid  Diet effective now               Disposition Plan  :  Remain inpatient  Consults  : None  Procedures  :  None  DVT  Prophylaxis  :  Lovenox   Lab Results  Component Value Date   PLT 580 (H) 01/26/2019    Inpatient Medications  Scheduled Meds: . ferrous sulfate  325 mg Oral TID WC  .  guaiFENesin-dextromethorphan  10 mL Oral Q8H  . Ipratropium-Albuterol  1 puff Inhalation TID  . methylPREDNISolone (SOLU-MEDROL) injection  60 mg Intravenous Q12H  . sodium chloride flush  3 mL Intravenous Q12H  . vitamin C  500 mg Oral Daily  . zinc sulfate  220 mg Oral Daily   Continuous Infusions: . sodium chloride    . remdesivir 100 mg in NS 250 mL 100 mg (01/25/19 2143)   PRN Meds:.sodium chloride, acetaminophen, chlorpheniramine-HYDROcodone, HYDROcodone-acetaminophen, ondansetron **OR** ondansetron (ZOFRAN) IV, phenol, polyethylene glycol, sodium chloride flush, zolpidem  Antibiotics  :    Anti-infectives (From admission, onward)   Start     Dose/Rate Route Frequency Ordered Stop   01/25/19 2200  remdesivir 100 mg in sodium chloride 0.9 % 230 mL IVPB     100 mg 500 mL/hr over 30 Minutes Intravenous Every 24 hours 01/24/19 2213 01/29/19 2159   01/24/19 2300  remdesivir 200 mg in sodium chloride 0.9 % 210 mL IVPB     200 mg 500 mL/hr over 30 Minutes Intravenous Once 01/24/19 2213 01/24/19 2328       Time Spent in minutes  25   Oren Binet M.D on 01/26/2019 at 1:35 PM  To page go to [email or web address] - use universal password  [facility] -  Office  [phone]   Admit date - 01/24/2019    2    Objective:   Vitals:   01/26/19 0355 01/26/19 0930 01/26/19 0931 01/26/19 1313  BP: 122/77 135/75  138/77  Pulse: 75 100    Resp: 20  18 18   Temp: 98.4 F (36.9 C)  98.6 F (37 C) 98.3 F (36.8 C)  TempSrc: Oral  Oral Oral  SpO2: 97% 94%    Weight:      Height:        Wt Readings from Last 3 Encounters:  01/24/19 95 kg  01/22/19 95.3 kg  03/05/17 95.3 kg     Intake/Output Summary (Last 24 hours) at 01/26/2019 1335 Last data filed at 01/26/2019 1100 Gross per 24 hour  Intake 240 ml   Output 1450 ml  Net -1210 ml     Physical Exam Gen Exam:IAlert awake-not in any distress HEENT:atraumatic, normocephalic Chest: B/L clear to auscultation anteriorly CVS:S1S2 regular Abdomen:soft non tender, non distended Extremities:no edema Neurology: Nonfocal Skin: no rash   Data Review:    CBC Recent Labs  Lab 01/24/19 1045 01/24/19 1705 01/25/19 0917 01/26/19 0500  WBC 8.7 6.8 3.9* 6.0  HGB 8.6* 7.8*  7.9* 7.7* 7.5*  HCT 30.4* 27.6* 28.1* 26.6*  PLT 455* 476* 496* 580*  MCV 62.3* 62.2* 62.3* 62.0*  MCH 17.6* 17.6* 17.1* 17.5*  MCHC 28.3* 28.3* 27.4* 28.2*  RDW 20.7* 20.5* 20.4* 20.2*  LYMPHSABS 1.0  --  0.4* 0.6*  MONOABS 0.7  --  0.1 0.3  EOSABS 0.4  --  0.0 0.0  BASOSABS 0.0  --  0.0 0.0    Chemistries  Recent Labs  Lab 01/24/19 1045 01/24/19 1705 01/25/19 0917 01/26/19 0500  NA 137  --  136 136  K 4.1  --  3.8 4.3  CL 102  --  102 106  CO2 22  --  21* 21*  GLUCOSE 133*  --  228* 203*  BUN 11  --  15 16  CREATININE 0.88 0.68 0.73 0.65  CALCIUM 9.4  --  8.7* 8.9  MG  --   --  2.0 2.1  AST  --   --  47* 44*  ALT  --   --  31 41  ALKPHOS  --   --  80 72  BILITOT  --   --  0.4 0.3   ------------------------------------------------------------------------------------------------------------------ Recent Labs    01/24/19 1700 01/25/19 0917  TRIG 116 71    Lab Results  Component Value Date   HGBA1C 5.9 (H) 12/28/2016   ------------------------------------------------------------------------------------------------------------------ No results for input(s): TSH, T4TOTAL, T3FREE, THYROIDAB in the last 72 hours.  Invalid input(s): FREET3 ------------------------------------------------------------------------------------------------------------------ Recent Labs    01/24/19 1705 01/24/19 1900 01/25/19 0917 01/26/19 0500  VITAMINB12 372  --   --   --   FOLATE  --  20.8  --   --   FERRITIN 21*  --  18* 21*  TIBC 389  --   --   --    IRON 11*  --   --   --   RETICCTPCT 0.4  --   --   --     Coagulation profile No results for input(s): INR, PROTIME in the last 168 hours.  Recent Labs    01/25/19 0917 01/26/19 0500  DDIMER 2.56* 1.25*    Cardiac Enzymes Recent Labs  Lab 01/24/19 1705  TROPONINI <0.03   ------------------------------------------------------------------------------------------------------------------    Component Value Date/Time   BNP 11.2 01/24/2019 1900    Micro Results Recent Results (from the past 240 hour(s))  Novel Coronavirus, NAA (hospital order; send-out to ref lab)     Status: Abnormal   Collection Time: 01/22/19  3:18 AM  Result Value Ref Range Status   SARS-CoV-2, NAA DETECTED (A) NOT DETECTED Final    Comment: RESULT CALLED TO, READ BACK BY AND VERIFIED WITH: NOTIFIED LORI BERDICK VIA EMAIL 01/23/19 D. VANHOOK (NOTE) This test was developed and its performance characteristics determined by [employer]. This test has not been FDA cleared or approved. This test has been authorized by FDA under an Emergency Use Authorization (EUA). This test is only authorized for the duration of time the declaration that circumstances exist justifying the authorization of the emergency use of in vitro diagnostic tests for detection of SARS-CoV-2 virus and/or diagnosis of COVID-19 infection under section 564(b)(1) of the Act, 21 U.S.C. 672CNO-7(S)(9), unless the authorization is terminated or revoked sooner. When diagnostic testing is negative, the possibility of a false negative result should be considered in the context of a patient's recent exposures and the presence of clinical signs and symptoms consistent with COVID-19. An individual without symptoms of COVID-19 and who is not shedd ing SARS-CoV-2 virus would expect to have a negative (not detected) result in this assay. Performed At: [facility] [address] [place], [place] [id number] Rush Farmer MD  TM:[id number]    [place]  Final    Comment: Performed at [facility], [place] [address] [place], [place] [address]    Radiology Reports Dg Chest [place] 1 View  Result Date: 01/24/2019 CLINICAL DATA:  Shortness of breath EXAM: PORTABLE CHEST 1 VIEW COMPARISON:  Two days ago FINDINGS: Subtle, patchy bilateral lung opacity. Normal heart size. No edema, effusion, or pneumothorax. IMPRESSION: Lower lung volumes with mild bilateral infiltrate. Electronically Signed   By: Monte Fantasia M.D.   On: 01/24/2019 10:59   Dg Chest Port 1 View  Result Date: 01/22/2019 CLINICAL DATA:  Cough and fever EXAM: PORTABLE CHEST 1 VIEW COMPARISON:  08/30/2009 FINDINGS: The cardiac silhouette is at the upper limits of normal with respect to size. There is no pneumothorax. No large pleural effusion. There is  a density projecting over the ninth rib posteriorly on the left. There is no acute osseous abnormality. IMPRESSION: 1. No definite acute cardiopulmonary process. 2. Small opacity projecting over the posterior left ninth rib which may represent atelectasis, and infiltrate, or a pulmonary nodule. A 4-6 week follow-up two-view chest x-ray is recommended to confirm resolution of this finding. Electronically Signed   By: Constance Holster M.D.   On: 01/22/2019 02:04

## 2019-01-26 NOTE — Care Management (Addendum)
Case manager will continue to monitor patient for appropriate disposition as he medically improves. May God Bless him to do so. Patient is from home with wife and family. Patient is active with [employer] and wellness. F/U appointment will be scheduled.      Ricki Miller, RN BSN Case Manager [phone]

## 2019-01-27 LAB — COMPREHENSIVE METABOLIC PANEL
ALT: 37 U/L (ref 0–44)
AST: 27 U/L (ref 15–41)
Albumin: 3.4 g/dL — ABNORMAL LOW (ref 3.5–5.0)
Alkaline Phosphatase: 73 U/L (ref 38–126)
Anion gap: 10 (ref 5–15)
BUN: 17 mg/dL (ref 6–20)
CO2: 23 mmol/L (ref 22–32)
Calcium: 8.7 mg/dL — ABNORMAL LOW (ref 8.9–10.3)
Chloride: 104 mmol/L (ref 98–111)
Creatinine, Ser: 0.66 mg/dL (ref 0.61–1.24)
GFR calc Af Amer: >60 mL/min (ref >60)
GFR calc non Af Amer: >60 mL/min (ref >60)
Glucose, Bld: 198 mg/dL — ABNORMAL HIGH (ref 70–99)
Potassium: 4.2 mmol/L (ref 3.5–5.1)
Sodium: 137 mmol/L (ref 135–145)
Total Bilirubin: 0.2 mg/dL — ABNORMAL LOW (ref 0.3–1.2)
Total Protein: 7.3 g/dL (ref 6.5–8.1)

## 2019-01-27 LAB — CBC WITH DIFFERENTIAL/PLATELET
Abs Immature Granulocytes: 0.27 10*3/uL — ABNORMAL HIGH (ref 0.00–0.07)
Basophils Absolute: 0 10*3/uL (ref 0.0–0.1)
Basophils Relative: 0 %
Eosinophils Absolute: 0 10*3/uL (ref 0.0–0.5)
Eosinophils Relative: 0 %
HCT: 27 % — ABNORMAL LOW (ref 39.0–52.0)
Hemoglobin: 7.5 g/dL — ABNORMAL LOW (ref 13.0–17.0)
Immature Granulocytes: 3 %
Lymphocytes Relative: 8 %
Lymphs Abs: 0.8 10*3/uL (ref 0.7–4.0)
MCH: 17.2 pg — ABNORMAL LOW (ref 26.0–34.0)
MCHC: 27.8 g/dL — ABNORMAL LOW (ref 30.0–36.0)
MCV: 61.9 fL — ABNORMAL LOW (ref 80.0–100.0)
Monocytes Absolute: 0.4 10*3/uL (ref 0.1–1.0)
Monocytes Relative: 4 %
Neutro Abs: 8.8 10*3/uL — ABNORMAL HIGH (ref 1.7–7.7)
Neutrophils Relative %: 85 %
Platelets: 599 10*3/uL — ABNORMAL HIGH (ref 150–400)
RBC: 4.36 MIL/uL (ref 4.22–5.81)
RDW: 20.3 % — ABNORMAL HIGH (ref 11.5–15.5)
WBC: 10.3 10*3/uL (ref 4.0–10.5)
nRBC: 0.4 % — ABNORMAL HIGH (ref 0.0–0.2)

## 2019-01-27 LAB — PHOSPHORUS: Phosphorus: 3.7 mg/dL (ref 2.5–4.6)

## 2019-01-27 LAB — GLUCOSE, CAPILLARY
Glucose-Capillary: 161 mg/dL — ABNORMAL HIGH (ref 70–99)
Glucose-Capillary: 282 mg/dL — ABNORMAL HIGH (ref 70–99)
Glucose-Capillary: 156 mg/dL — ABNORMAL HIGH (ref 70–99)
Glucose-Capillary: 229 mg/dL — ABNORMAL HIGH (ref 70–99)

## 2019-01-27 LAB — CK: Total CK: 79 U/L (ref 49–397)

## 2019-01-27 LAB — D-DIMER, QUANTITATIVE: D-Dimer, Quant: 1.19 ug/mL-FEU — ABNORMAL HIGH (ref 0.00–0.50)

## 2019-01-27 LAB — FERRITIN: Ferritin: 16 ng/mL — ABNORMAL LOW (ref 24–336)

## 2019-01-27 LAB — MAGNESIUM: Magnesium: 2.1 mg/dL (ref 1.7–2.4)

## 2019-01-27 LAB — C-REACTIVE PROTEIN: CRP: 0.8 mg/dL (ref <1.0)

## 2019-01-27 NOTE — Progress Notes (Signed)
PROGRESS NOTE                                                                                                                                                                                                             Patient Demographics:    Ricardo Ward, is a 48 y.o. male, DOB - 15-Feb-1971, ZTI:[id number]  Outpatient Primary MD for the patient is Hairston, Maylon Peppers, FNP    LOS - 3  Chief Complaint  Patient presents with   Shortness of Breath       Brief Narrative: Patient is a 48 y.o. male with no significant PMHx presented with acute hypoxic respiratory failure secondary to COVID-19 viral pneumonia.   Subjective:    Ricardo Ward remains without remaining complaints.  Denies any chest pain or shortness of breath.  Afebrile.  Lying comfortably in bed.   Assessment  & Plan :   Acute Hypoxic Resp Failure due to Covid 19 Viral pneumonia: Has rapidly improved-on room air this morning.  Continue Remdesivir for total of 5 days.   COVID-19 Labs:  Recent Labs    01/24/19 1705 01/25/19 0917 01/26/19 0500 01/27/19 0510  DDIMER 3.92* 2.56* 1.25* 1.19*  FERRITIN 21* 18* 21* 16*  LDH 230*  --   --   --   CRP 5.5* 6.3* 2.7* 0.8    Lab Results  Component Value Date   SARSCOV2NAA DETECTED (A) 01/22/2019     COVID-19 Medications: 6/5>> Remdesivir 6/5>> Solu-Medrol  Microcytic anemia: Has iron deficiency-no overt GI bleeding-hemoglobin stable.  Continue iron supplementation.  Patient aware that he needs outpatient elective colonoscopy-he claims his wife is working on getting him an appointment.     DM-2:A1c 6.5-probably will require metformin on discharge.  ABG: No results found for: PHART, PCO2ART, PO2ART, HCO3, TCO2, ACIDBASEDEF, O2SAT  Condition - Stable  Family Communication  : None  Code Status :  Full Code  Diet :  Diet Order            Diet regular Room service appropriate? Yes; Fluid  consistency: Thin; Fluid restriction: 1200 mL Fluid  Diet effective now               Disposition Plan  :  Remain inpatient  Consults  : None  Procedures  :  None  DVT Prophylaxis  :  Lovenox  Lab Results  Component Value Date   PLT 599 (H) 01/27/2019    Inpatient Medications  Scheduled Meds:  ferrous sulfate  325 mg Oral TID WC   guaiFENesin-dextromethorphan  10 mL Oral Q8H   insulin aspart  0-9 Units Subcutaneous TID WC   Ipratropium-Albuterol  1 puff Inhalation TID   sodium chloride flush  3 mL Intravenous Q12H   vitamin C  500 mg Oral Daily   zinc sulfate  220 mg Oral Daily   Continuous Infusions:  sodium chloride     remdesivir 100 mg in NS 250 mL 100 mg (01/26/19 2147)   PRN Meds:.sodium chloride, acetaminophen, chlorpheniramine-HYDROcodone, HYDROcodone-acetaminophen, ondansetron **OR** ondansetron (ZOFRAN) IV, phenol, polyethylene glycol, sodium chloride flush, zolpidem  Antibiotics  :    Anti-infectives (From admission, onward)   Start     Dose/Rate Route Frequency Ordered Stop   01/25/19 2200  remdesivir 100 mg in sodium chloride 0.9 % 230 mL IVPB     100 mg 500 mL/hr over 30 Minutes Intravenous Every 24 hours 01/24/19 2213 01/29/19 2159   01/24/19 2300  remdesivir 200 mg in sodium chloride 0.9 % 210 mL IVPB     200 mg 500 mL/hr over 30 Minutes Intravenous Once 01/24/19 2213 01/24/19 2328       Time Spent in minutes  25   Oren Binet M.D on 01/27/2019 at 11:47 AM  To page go to [email or web address] - use universal password  [facility] -  Office  [phone]   Admit date - 01/24/2019    3    Objective:   Vitals:   01/26/19 1800 01/26/19 1945 01/26/19 2000 01/27/19 0800  BP:  134/78  135/86  Pulse: 79  86   Resp:      Temp:  98.6 F (37 C)  98.1 F (36.7 C)  TempSrc:  Oral  Oral  SpO2:   92%   Weight:      Height:        Wt Readings from Last 3 Encounters:  01/24/19 95 kg  01/22/19 95.3 kg  03/05/17 95.3 kg      Intake/Output Summary (Last 24 hours) at 01/27/2019 1147 Last data filed at 01/27/2019 0900 Gross per 24 hour  Intake 600 ml  Output 800 ml  Net -200 ml     Physical Exam General appearance:Awake, alert, not in any distress.  Eyes:no scleral icterus. HEENT: Atraumatic and Normocephalic Neck: supple Resp:Good air entry bilaterally,no rales or rhonchi CVS: S1 S2 regular, no murmurs.  GI: Bowel sounds present, Non tender and not distended with no gaurding, rigidity or rebound. Extremities: B/L Lower Ext shows no edema, both legs are warm to touch Neurology:  Non focal Musculoskeletal:No digital cyanosis Skin:No Rash, warm and dry Wounds:N/A   Data Review:    CBC Recent Labs  Lab 01/24/19 1045 01/24/19 1705 01/25/19 0917 01/26/19 0500 01/27/19 0510  WBC 8.7 6.8 3.9* 6.0 10.3  HGB 8.6* 7.8*   7.9* 7.7* 7.5* 7.5*  HCT 30.4* 27.6* 28.1* 26.6* 27.0*  PLT 455* 476* 496* 580* 599*  MCV 62.3* 62.2* 62.3* 62.0* 61.9*  MCH 17.6* 17.6* 17.1* 17.5* 17.2*  MCHC 28.3* 28.3* 27.4* 28.2* 27.8*  RDW 20.7* 20.5* 20.4* 20.2* 20.3*  LYMPHSABS 1.0  --  0.4* 0.6* 0.8  MONOABS 0.7  --  0.1 0.3 0.4  EOSABS 0.4  --  0.0 0.0 0.0  BASOSABS 0.0  --  0.0 0.0 0.0    Chemistries  Recent Labs  Lab 01/24/19 1045 01/24/19  1705 01/25/19 0917 01/26/19 0500 01/27/19 0510  NA 137  --  136 136 137  K 4.1  --  3.8 4.3 4.2  CL 102  --  102 106 104  CO2 22  --  21* 21* 23  GLUCOSE 133*  --  228* 203* 198*  BUN 11  --  15 16 17   CREATININE 0.88 0.68 0.73 0.65 0.66  CALCIUM 9.4  --  8.7* 8.9 8.7*  MG  --   --  2.0 2.1 2.1  AST  --   --  47* 44* 27  ALT  --   --  31 41 37  ALKPHOS  --   --  80 72 73  BILITOT  --   --  0.4 0.3 0.2*   ------------------------------------------------------------------------------------------------------------------ Recent Labs    01/24/19 1700 01/25/19 0917  TRIG 116 71    Lab Results  Component Value Date   HGBA1C 6.6 (H) 01/26/2019    ------------------------------------------------------------------------------------------------------------------ No results for input(s): TSH, T4TOTAL, T3FREE, THYROIDAB in the last 72 hours.  Invalid input(s): FREET3 ------------------------------------------------------------------------------------------------------------------ Recent Labs    01/24/19 1705 01/24/19 1900  01/26/19 0500 01/27/19 0510  VITAMINB12 372  --   --   --   --   FOLATE  --  20.8  --   --   --   FERRITIN 21*  --    < > 21* 16*  TIBC 389  --   --   --   --   IRON 11*  --   --   --   --   RETICCTPCT 0.4  --   --   --   --    < > = values in this interval not displayed.    Coagulation profile No results for input(s): INR, PROTIME in the last 168 hours.  Recent Labs    01/26/19 0500 01/27/19 0510  DDIMER 1.25* 1.19*    Cardiac Enzymes Recent Labs  Lab 01/24/19 1705  TROPONINI <0.03   ------------------------------------------------------------------------------------------------------------------    Component Value Date/Time   BNP 11.2 01/24/2019 1900    Micro Results Recent Results (from the past 240 hour(s))  Novel Coronavirus, NAA (hospital order; send-out to ref lab)     Status: Abnormal   Collection Time: 01/22/19  3:18 AM  Result Value Ref Range Status   SARS-CoV-2, NAA DETECTED (A) NOT DETECTED Final    Comment: RESULT CALLED TO, READ BACK BY AND VERIFIED WITH: NOTIFIED LORI BERDICK VIA EMAIL 01/23/19 D. VANHOOK (NOTE) This test was developed and its performance characteristics determined by [employer]. This test has not been FDA cleared or approved. This test has been authorized by FDA under an Emergency Use Authorization (EUA). This test is only authorized for the duration of time the declaration that circumstances exist justifying the authorization of the emergency use of in vitro diagnostic tests for detection of SARS-CoV-2 virus and/or diagnosis of COVID-19  infection under section 564(b)(1) of the Act, 21 U.S.C. 242AST-4(H)(9), unless the authorization is terminated or revoked sooner. When diagnostic testing is negative, the possibility of a false negative result should be considered in the context of a patient's recent exposures and the presence of clinical signs and symptoms consistent with COVID-19. An individual without symptoms of COVID-19 and who is not shedd ing SARS-CoV-2 virus would expect to have a negative (not detected) result in this assay. Performed At: [facility] [address] [place], [place] [id number] Rush Farmer MD QJ:[id number]    [place]  Final    Comment: Performed at [facility], [place] [address] [place], [place] [address]    Radiology Reports Dg Chest [place] 1 View  Result Date: 01/24/2019 CLINICAL DATA:  Shortness of breath EXAM: PORTABLE CHEST 1 VIEW COMPARISON:  Two days ago FINDINGS: Subtle, patchy bilateral lung opacity. Normal heart size. No edema, effusion, or pneumothorax. IMPRESSION: Lower lung volumes with mild bilateral infiltrate. Electronically Signed   By: Monte Fantasia M.D.   On: 01/24/2019 10:59   Dg Chest Port 1 View  Result Date: 01/22/2019 CLINICAL DATA:  Cough and fever EXAM: PORTABLE CHEST 1 VIEW COMPARISON:  08/30/2009 FINDINGS: The cardiac silhouette is at the upper limits of normal with respect to size. There is no pneumothorax. No large pleural effusion. There is a density projecting over the ninth rib posteriorly on the left. There is no acute osseous abnormality. IMPRESSION: 1. No definite acute cardiopulmonary process. 2. Small opacity projecting over the posterior left ninth rib which may represent atelectasis, and infiltrate, or a pulmonary nodule. A 4-6 week follow-up two-view chest x-ray is recommended to confirm resolution of this finding. Electronically Signed   By: Constance Holster M.D.   On: 01/22/2019 02:04

## 2019-01-27 NOTE — Progress Notes (Signed)
CardioVascular Rewsearch Department and AHF Team  ReDS Research Project   Patient #: [id number]  ReDS Measurement  Right: 33%  Left: 30%

## 2019-01-28 DIAGNOSIS — R0902 Hypoxemia: Secondary | ICD-10-CM

## 2019-01-28 LAB — COMPREHENSIVE METABOLIC PANEL
ALT: 34 U/L (ref 0–44)
AST: 24 U/L (ref 15–41)
Albumin: 3.3 g/dL — ABNORMAL LOW (ref 3.5–5.0)
Alkaline Phosphatase: 74 U/L (ref 38–126)
Anion gap: 11 (ref 5–15)
BUN: 17 mg/dL (ref 6–20)
CO2: 24 mmol/L (ref 22–32)
Calcium: 8.4 mg/dL — ABNORMAL LOW (ref 8.9–10.3)
Chloride: 103 mmol/L (ref 98–111)
Creatinine, Ser: 0.76 mg/dL (ref 0.61–1.24)
GFR calc Af Amer: >60 mL/min (ref >60)
GFR calc non Af Amer: >60 mL/min (ref >60)
Glucose, Bld: 106 mg/dL — ABNORMAL HIGH (ref 70–99)
Potassium: 3.3 mmol/L — ABNORMAL LOW (ref 3.5–5.1)
Sodium: 138 mmol/L (ref 135–145)
Total Bilirubin: 0.2 mg/dL — ABNORMAL LOW (ref 0.3–1.2)
Total Protein: 7 g/dL (ref 6.5–8.1)

## 2019-01-28 LAB — HEMOGLOBINOPATHY EVALUATION
Hgb A2 Quant: 1.5 % — ABNORMAL LOW (ref 1.8–3.2)
Hgb A: 98.5 % (ref 96.4–98.8)
Hgb C: 0 %
Hgb F Quant: 0 % (ref 0.0–2.0)
Hgb S Quant: 0 %
Hgb Variant: 0 %

## 2019-01-28 LAB — CBC WITH DIFFERENTIAL/PLATELET
Abs Immature Granulocytes: 0.35 10*3/uL — ABNORMAL HIGH (ref 0.00–0.07)
Basophils Absolute: 0.1 10*3/uL (ref 0.0–0.1)
Basophils Relative: 1 %
Eosinophils Absolute: 0.1 10*3/uL (ref 0.0–0.5)
Eosinophils Relative: 1 %
HCT: 28.2 % — ABNORMAL LOW (ref 39.0–52.0)
Hemoglobin: 7.7 g/dL — ABNORMAL LOW (ref 13.0–17.0)
Immature Granulocytes: 3 %
Lymphocytes Relative: 18 %
Lymphs Abs: 1.9 10*3/uL (ref 0.7–4.0)
MCH: 17.2 pg — ABNORMAL LOW (ref 26.0–34.0)
MCHC: 27.3 g/dL — ABNORMAL LOW (ref 30.0–36.0)
MCV: 62.9 fL — ABNORMAL LOW (ref 80.0–100.0)
Monocytes Absolute: 1 10*3/uL (ref 0.1–1.0)
Monocytes Relative: 10 %
Neutro Abs: 7 10*3/uL (ref 1.7–7.7)
Neutrophils Relative %: 67 %
Platelets: 588 10*3/uL — ABNORMAL HIGH (ref 150–400)
RBC: 4.48 MIL/uL (ref 4.22–5.81)
RDW: 20.8 % — ABNORMAL HIGH (ref 11.5–15.5)
WBC: 10.4 10*3/uL (ref 4.0–10.5)
nRBC: 1.1 % — ABNORMAL HIGH (ref 0.0–0.2)

## 2019-01-28 LAB — FERRITIN: Ferritin: 13 ng/mL — ABNORMAL LOW (ref 24–336)

## 2019-01-28 LAB — GLUCOSE, CAPILLARY
Glucose-Capillary: 127 mg/dL — ABNORMAL HIGH (ref 70–99)
Glucose-Capillary: 123 mg/dL — ABNORMAL HIGH (ref 70–99)
Glucose-Capillary: 118 mg/dL — ABNORMAL HIGH (ref 70–99)
Glucose-Capillary: 279 mg/dL — ABNORMAL HIGH (ref 70–99)

## 2019-01-28 LAB — MAGNESIUM: Magnesium: 2.1 mg/dL (ref 1.7–2.4)

## 2019-01-28 LAB — PHOSPHORUS: Phosphorus: 3.7 mg/dL (ref 2.5–4.6)

## 2019-01-28 LAB — C-REACTIVE PROTEIN: CRP: <0.8 mg/dL (ref <1.0)

## 2019-01-28 LAB — D-DIMER, QUANTITATIVE: D-Dimer, Quant: 1.74 ug/mL-FEU — ABNORMAL HIGH (ref 0.00–0.50)

## 2019-01-28 LAB — CK: Total CK: 60 U/L (ref 49–397)

## 2019-01-28 MED ORDER — POTASSIUM CHLORIDE CRYS ER 20 MEQ PO TBCR
40.0000 meq | EXTENDED_RELEASE_TABLET | Freq: Once | ORAL | Status: AC
  Administered 2019-01-28: 40 meq via ORAL
  Filled 2019-01-28: qty 2

## 2019-01-28 NOTE — Progress Notes (Signed)
PROGRESS NOTE                                                                                                                                                                                                             Patient Demographics:    Ricardo Ward, is a 48 y.o. male, DOB - November 22, 1970, EXN:[id number]  Outpatient Primary MD for the patient is Hairston, Maylon Peppers, FNP    LOS - 4  Chief Complaint  Patient presents with   Shortness of Breath       Brief Narrative: Patient is a 48 y.o. male with no significant PMHx presented with acute hypoxic respiratory failure secondary to COVID-19 viral pneumonia.   Subjective:    Ricardo Ward remains without remaining complaints.  Denies any chest pain or shortness of breath.  Afebrile.  Lying comfortably in bed.   Assessment  & Plan :   Acute Hypoxic Resp Failure due to Covid 19 Viral pneumonia: Rapidly improved-on room air.  Will complete Remdesivir today.  Will observe overnight to make sure he is still on room air.  Likely discharge tomorrow morning on 6/10. Marland Kitchen   COVID-19 Labs:  Recent Labs    01/26/19 0500 01/27/19 0510 01/28/19 0204  DDIMER 1.25* 1.19* 1.74*  FERRITIN 21* 16* 13*  CRP 2.7* 0.8 <0.8    Lab Results  Component Value Date   SARSCOV2NAA DETECTED (A) 01/22/2019     COVID-19 Medications: 6/5>> Remdesivir 6/5>> Solu-Medrol  Microcytic anemia: Has iron deficiency-no overt GI bleeding-hemoglobin stable.  Continue iron supplementation.  Patient aware that he needs outpatient elective colonoscopy-he claims his wife is working on getting him an appointment.     DM-2:A1c 6.5-probably will require metformin on discharge.  ABG: No results found for: PHART, PCO2ART, PO2ART, HCO3, TCO2, ACIDBASEDEF, O2SAT  Condition - Stable  Family Communication  : None  Code Status :  Full Code  Diet :  Diet Order            Diet regular Room service  appropriate? Yes; Fluid consistency: Thin; Fluid restriction: 1200 mL Fluid  Diet effective now               Disposition Plan  :  Remain inpatient  Consults  : None  Procedures  :  None  DVT Prophylaxis  :  Lovenox   Lab  Results  Component Value Date   PLT 588 (H) 01/28/2019    Inpatient Medications  Scheduled Meds:  ferrous sulfate  325 mg Oral TID WC   guaiFENesin-dextromethorphan  10 mL Oral Q8H   insulin aspart  0-9 Units Subcutaneous TID WC   Ipratropium-Albuterol  1 puff Inhalation TID   sodium chloride flush  3 mL Intravenous Q12H   vitamin C  500 mg Oral Daily   zinc sulfate  220 mg Oral Daily   Continuous Infusions:  sodium chloride     remdesivir 100 mg in NS 250 mL Stopped (01/28/19 0013)   PRN Meds:.sodium chloride, acetaminophen, chlorpheniramine-HYDROcodone, HYDROcodone-acetaminophen, ondansetron **OR** ondansetron (ZOFRAN) IV, phenol, polyethylene glycol, sodium chloride flush, zolpidem  Antibiotics  :    Anti-infectives (From admission, onward)   Start     Dose/Rate Route Frequency Ordered Stop   01/25/19 2200  remdesivir 100 mg in sodium chloride 0.9 % 230 mL IVPB     100 mg 500 mL/hr over 30 Minutes Intravenous Every 24 hours 01/24/19 2213 01/29/19 2159   01/24/19 2300  remdesivir 200 mg in sodium chloride 0.9 % 210 mL IVPB     200 mg 500 mL/hr over 30 Minutes Intravenous Once 01/24/19 2213 01/24/19 2328       Time Spent in minutes  25   Oren Binet M.D on 01/28/2019 at 1:38 PM  To page go to [email or web address] - use universal password  [facility] -  Office  [phone]   Admit date - 01/24/2019    4    Objective:   Vitals:   01/27/19 2103 01/28/19 0800 01/28/19 0823 01/28/19 1313  BP: 140/83   122/73  Pulse: 68 81    Resp: 18  18 19   Temp: 98.8 F (37.1 C)  99.1 F (37.3 C) 98.7 F (37.1 C)  TempSrc: Oral  Oral Oral  SpO2: 100% 93%  93%  Weight:      Height:        Wt Readings from Last 3 Encounters:    01/24/19 95 kg  01/22/19 95.3 kg  03/05/17 95.3 kg     Intake/Output Summary (Last 24 hours) at 01/28/2019 1338 Last data filed at 01/28/2019 0359 Gross per 24 hour  Intake --  Output 1750 ml  Net -1750 ml     Physical Exam General appearance:Awake, alert, not in any distress.  Eyes:no scleral icterus. HEENT: Atraumatic and Normocephalic Neck: supple, no JVD. Resp:Good air entry bilaterally,no rales or rhonchi CVS: S1 S2 regular, no murmurs.  GI: Bowel sounds present, Non tender and not distended with no gaurding, rigidity or rebound. Extremities: B/L Lower Ext shows no edema, both legs are warm to touch Neurology:  Non focal Psychiatric: Normal judgment and insight. Normal mood. Musculoskeletal:No digital cyanosis Skin:No Rash, warm and dry Wounds:N/A   Data Review:    CBC Recent Labs  Lab 01/24/19 1045 01/24/19 1705 01/25/19 0917 01/26/19 0500 01/27/19 0510 01/28/19 0204  WBC 8.7 6.8 3.9* 6.0 10.3 10.4  HGB 8.6* 7.8*   7.9* 7.7* 7.5* 7.5* 7.7*  HCT 30.4* 27.6* 28.1* 26.6* 27.0* 28.2*  PLT 455* 476* 496* 580* 599* 588*  MCV 62.3* 62.2* 62.3* 62.0* 61.9* 62.9*  MCH 17.6* 17.6* 17.1* 17.5* 17.2* 17.2*  MCHC 28.3* 28.3* 27.4* 28.2* 27.8* 27.3*  RDW 20.7* 20.5* 20.4* 20.2* 20.3* 20.8*  LYMPHSABS 1.0  --  0.4* 0.6* 0.8 1.9  MONOABS 0.7  --  0.1 0.3 0.4 1.0  EOSABS 0.4  --  0.0 0.0  0.0 0.1  BASOSABS 0.0  --  0.0 0.0 0.0 0.1    Chemistries  Recent Labs  Lab 01/24/19 1045 01/24/19 1705 01/25/19 0917 01/26/19 0500 01/27/19 0510 01/28/19 0204  NA 137  --  136 136 137 138  K 4.1  --  3.8 4.3 4.2 3.3*  CL 102  --  102 106 104 103  CO2 22  --  21* 21* 23 24  GLUCOSE 133*  --  228* 203* 198* 106*  BUN 11  --  15 16 17 17   CREATININE 0.88 0.68 0.73 0.65 0.66 0.76  CALCIUM 9.4  --  8.7* 8.9 8.7* 8.4*  MG  --   --  2.0 2.1 2.1 2.1  AST  --   --  47* 44* 27 24  ALT  --   --  31 41 37 34  ALKPHOS  --   --  80 72 73 74  BILITOT  --   --  0.4 0.3 0.2* 0.2*    ------------------------------------------------------------------------------------------------------------------ No results for input(s): CHOL, HDL, LDLCALC, TRIG, CHOLHDL, LDLDIRECT in the last 72 hours.  Lab Results  Component Value Date   HGBA1C 6.6 (H) 01/26/2019   ------------------------------------------------------------------------------------------------------------------ No results for input(s): TSH, T4TOTAL, T3FREE, THYROIDAB in the last 72 hours.  Invalid input(s): FREET3 ------------------------------------------------------------------------------------------------------------------ Recent Labs    01/27/19 0510 01/28/19 0204  FERRITIN 16* 13*    Coagulation profile No results for input(s): INR, PROTIME in the last 168 hours.  Recent Labs    01/27/19 0510 01/28/19 0204  DDIMER 1.19* 1.74*    Cardiac Enzymes Recent Labs  Lab 01/24/19 1705  TROPONINI <0.03   ------------------------------------------------------------------------------------------------------------------    Component Value Date/Time   BNP 11.2 01/24/2019 1900    Micro Results Recent Results (from the past 240 hour(s))  Novel Coronavirus, NAA (hospital order; send-out to ref lab)     Status: Abnormal   Collection Time: 01/22/19  3:18 AM  Result Value Ref Range Status   SARS-CoV-2, NAA DETECTED (A) NOT DETECTED Final    Comment: RESULT CALLED TO, READ BACK BY AND VERIFIED WITH: NOTIFIED LORI BERDICK VIA EMAIL 01/23/19 D. VANHOOK (NOTE) This test was developed and its performance characteristics determined by [employer]. This test has not been FDA cleared or approved. This test has been authorized by FDA under an Emergency Use Authorization (EUA). This test is only authorized for the duration of time the declaration that circumstances exist justifying the authorization of the emergency use of in vitro diagnostic tests for detection of SARS-CoV-2 virus and/or diagnosis of  COVID-19 infection under section 564(b)(1) of the Act, 21 U.S.C. 287OMV-6(H)(2), unless the authorization is terminated or revoked sooner. When diagnostic testing is negative, the possibility of a false negative result should be considered in the context of a patient's recent exposures and the presence of clinical signs and symptoms consistent with COVID-19. An individual without symptoms of COVID-19 and who is not shedd ing SARS-CoV-2 virus would expect to have a negative (not detected) result in this assay. Performed At: [facility] [address] [place], [place] [id number] Rush Farmer MD ZM:[id number]    [place]  Final    Comment: Performed at [facility], [place] [address] [place], [place] [address]    Radiology Reports Dg Chest [place] 1 View  Result Date: 01/24/2019 CLINICAL DATA:  Shortness of breath EXAM: PORTABLE CHEST 1 VIEW COMPARISON:  Two days ago FINDINGS: Subtle, patchy bilateral lung opacity. Normal heart size. No edema, effusion,  or pneumothorax. IMPRESSION: Lower lung volumes with mild bilateral infiltrate. Electronically Signed   By: Monte Fantasia M.D.   On: 01/24/2019 10:59   Dg Chest [place] 1 View  Result Date: 01/22/2019 CLINICAL DATA:  Cough and fever EXAM: PORTABLE CHEST 1 VIEW COMPARISON:  08/30/2009 FINDINGS: The cardiac silhouette is at the upper limits of normal with respect to size. There is no pneumothorax. No large pleural effusion. There is a density projecting over the ninth rib posteriorly on the left. There is no acute osseous abnormality. IMPRESSION: 1. No definite acute cardiopulmonary process. 2. Small opacity projecting over the posterior left ninth rib which may represent atelectasis, and infiltrate, or a pulmonary nodule. A 4-6 week follow-up two-view chest x-ray is recommended to confirm resolution of this finding. Electronically Signed   By: Constance Holster M.D.   On: 01/22/2019 02:04

## 2019-01-28 NOTE — Progress Notes (Signed)
[facility] and AHF Team  ReDS Research Project   Patient #: [id number]  ReDS Measurement  Right: 30%  Left: 32%

## 2019-01-28 NOTE — Progress Notes (Signed)
Patient wanted to come off O2. Currently RA 94%.

## 2019-01-28 NOTE — Progress Notes (Signed)
Inpatient Diabetes Program Recommendations  AACE/ADA: New Consensus Statement on Inpatient Glycemic Control (2015)  Target Ranges:  Prepandial:   less than 140 mg/dL      Peak postprandial:   less than 180 mg/dL (1-2 hours)      Critically ill patients:  140 - 180 mg/dL   Lab Results  Component Value Date   GLUCAP 127 (H) 01/28/2019   HGBA1C 6.6 (H) 01/26/2019    Review of Glycemic Control  HS CBGs > 200 mg/dL x past 2 days. Would benefit from addition of Novolog 0-5 units QHS  Inpatient Diabetes Program Recommendations:     Please consider adding Novolog 0-5 units QHS.  Will continue to follow closely.  Thank you. Lorenda Peck, RD, LDN, CDE Inpatient Diabetes Coordinator [phone]

## 2019-01-28 NOTE — Progress Notes (Signed)
Patient asked if there was any family I could call for him to give an update on his condition. Patient states the doctor is calling and doesn't want for me to give any further information out at this time.

## 2019-01-29 DIAGNOSIS — E1165 Type 2 diabetes mellitus with hyperglycemia: Secondary | ICD-10-CM

## 2019-01-29 LAB — COMPREHENSIVE METABOLIC PANEL
ALT: 25 U/L (ref 0–44)
AST: 18 U/L (ref 15–41)
Albumin: 3.2 g/dL — ABNORMAL LOW (ref 3.5–5.0)
Alkaline Phosphatase: 74 U/L (ref 38–126)
Anion gap: 9 (ref 5–15)
BUN: 16 mg/dL (ref 6–20)
CO2: 23 mmol/L (ref 22–32)
Calcium: 8.4 mg/dL — ABNORMAL LOW (ref 8.9–10.3)
Chloride: 105 mmol/L (ref 98–111)
Creatinine, Ser: 0.74 mg/dL (ref 0.61–1.24)
GFR calc Af Amer: >60 mL/min (ref >60)
GFR calc non Af Amer: >60 mL/min (ref >60)
Glucose, Bld: 92 mg/dL (ref 70–99)
Potassium: 3.8 mmol/L (ref 3.5–5.1)
Sodium: 137 mmol/L (ref 135–145)
Total Bilirubin: 0.3 mg/dL (ref 0.3–1.2)
Total Protein: 6.5 g/dL (ref 6.5–8.1)

## 2019-01-29 LAB — PHOSPHORUS: Phosphorus: 4.2 mg/dL (ref 2.5–4.6)

## 2019-01-29 LAB — GLUCOSE, CAPILLARY: Glucose-Capillary: 109 mg/dL — ABNORMAL HIGH (ref 70–99)

## 2019-01-29 MED ORDER — FERROUS SULFATE 325 (65 FE) MG PO TABS: 325.0000 mg | ORAL_TABLET | Freq: Three times a day (TID) | ORAL | 0 refills | Status: DC

## 2019-01-29 MED ORDER — BLOOD GLUCOSE METER KIT: PACK | 0 refills | Status: AC

## 2019-01-29 MED ORDER — METFORMIN HCL 500 MG PO TABS: 500.0000 mg | ORAL_TABLET | Freq: Two times a day (BID) | ORAL | 0 refills | Status: DC

## 2019-01-29 MED ORDER — BENZONATATE 100 MG PO CAPS: 100.0000 mg | ORAL_CAPSULE | Freq: Three times a day (TID) | ORAL | 0 refills | Status: DC | PRN

## 2019-01-29 NOTE — Discharge Summary (Signed)
PATIENT DETAILS Name: Ricardo Ward Age: 48 y.o. Sex: male Date of Birth: 10-Jan-1971 MRN: [id number]. Admitting Physician: Lady Deutscher, MD DGL:[id number], Maylon Peppers, FNP  Admit Date: 01/24/2019 Discharge date: 01/29/2019  Recommendations for Outpatient Follow-up:  1. Follow up with PCP in 1-2 weeks 2. Please obtain BMP/CBC in one week 3. Appears to have DM-2-have started on metformin.  Please optimize 4. Patient has iron deficiency anemia-needs outpatient colonoscopy once he has completely recovered from COVID-19.  Admitted From:  Home  Disposition: [place]: No  Equipment/Devices: None  Discharge Condition: Stable  CODE STATUS: FULL CODE  Diet recommendation:  Carb Modified   Brief Summary: See H&P, Labs, Consult and Test reports for all details in brief, Patient is a 48 y.o. male with no significant PMHx presented with acute hypoxic respiratory failure secondary to COVID-19 viral pneumonia.  Brief Hospital Course: Acute Hypoxic Resp Failure due to Covid 19 Viral pneumonia: Rapidly improved-after being treated with Remdesivir x5 days-has been titrated to room air.  Since markedly better-and has completed a course of Remdesivir-stable to be discharged home today  Microcytic anemia: Has iron deficiency-no overt GI bleeding-hemoglobin stable.  Continue iron supplementation.  Patient aware that he needs outpatient elective colonoscopy-he claims his wife is working on getting him an appointment.    Patient counseled again on the day of discharge guarding the importance of pursuing a colonoscopy in the next few weeks-he is aware of the risk of colon cancer-and its life-threatening and life disabling effects.   DM-2:A1c 6.5-we will start metformin on discharge-further optimization deferred to the outpatient setting.    Procedures/Studies: None  Discharge Diagnoses:  Principal Problem:   Pneumonia due to COVID-19 virus Active Problems:   Microcytic  anemia   Hypoxemia   Acute on chronic respiratory failure with hypoxia [facility])   Discharge Instructions:  Activity:  As tolerated    Discharge Instructions    Call MD for:  difficulty breathing, headache or visual disturbances   Complete by:  As directed    Call MD for:  persistant dizziness or light-headedness   Complete by:  As directed    Call MD for:  temperature >100.4   Complete by:  As directed    Diet Carb Modified   Complete by:  As directed    Discharge instructions   Complete by:  As directed    Follow with Primary MD  Alfonse Spruce, FNP in 1 week  You have diabetes-we have started you on metformin-please check your blood sugars at least 3 times a day, keep a record of these readings and take it with you for your next appointment with your primary care practitioner.  You have microcytic anemia-and you are iron deficient-you need a colonoscopy in the next few weeks.  In some cases, iron deficiency can be suggestive of colon cancer.  Please get a complete blood count and chemistry panel checked by your Primary MD at your next visit, and again as instructed by your Primary MD.  Get Medicines reviewed and adjusted: Please take all your medications with you for your next visit with your Primary MD  Laboratory/radiological data: Please request your Primary MD to go over all hospital tests and procedure/radiological results at the follow up, please ask your Primary MD to get all Hospital records sent to his/her office.  In some cases, they will be blood work, cultures and biopsy results pending at the time of your discharge. Please request that your primary care  M.D. follows up on these results.  Also Note the following: If you experience worsening of your admission symptoms, develop shortness of breath, life threatening emergency, suicidal or homicidal thoughts you must seek medical attention immediately by calling 911 or calling your MD immediately  if symptoms less  severe.  You must read complete instructions/literature along with all the possible adverse reactions/side effects for all the Medicines you take and that have been prescribed to you. Take any new Medicines after you have completely understood and accpet all the possible adverse reactions/side effects.   Do not drive when taking Pain medications or sleeping medications (Benzodaizepines)  Do not take more than prescribed Pain, Sleep and Anxiety Medications. It is not advisable to combine anxiety,sleep and pain medications without talking with your primary care practitioner  Special Instructions: If you have smoked or chewed Tobacco  in the last 2 yrs please stop smoking, stop any regular Alcohol  and or any Recreational drug use.  Wear Seat belts while driving.  Please note: You were cared for by a hospitalist during your hospital stay. Once you are discharged, your primary care physician will handle any further medical issues. Please note that NO REFILLS for any discharge medications will be authorized once you are discharged, as it is imperative that you return to your primary care physician (or establish a relationship with a primary care physician if you do not have one) for your post hospital discharge needs so that they can reassess your need for medications and monitor your lab values.  ?   Person Under Monitoring Name: Aravind Chrismer  Location: [place] [address]   Infection Prevention Recommendations for Individuals Confirmed to have, or Being Evaluated for, 2019 Novel Coronavirus (COVID-19) Infection Who Receive Care at Home  Individuals who are confirmed to have, or are being evaluated for, COVID-19 should follow the prevention steps below until a healthcare provider or local or state health department says they can return to normal activities.  Stay home except to get medical care You should restrict activities outside your home, except for getting medical  care. Do not go to work, school, or public areas, and do not use public transportation or taxis.  Call ahead before visiting your doctor Before your medical appointment, call the healthcare provider and tell them that you have, or are being evaluated for, COVID-19 infection. This will help the healthcare provider's office take steps to keep other people from getting infected. Ask your healthcare provider to call the local or state health department.  Monitor your symptoms Seek prompt medical attention if your illness is worsening (e.g., difficulty breathing). Before going to your medical appointment, call the healthcare provider and tell them that you have, or are being evaluated for, COVID-19 infection. Ask your healthcare provider to call the local or state health department.  Wear a facemask You should wear a facemask that covers your nose and mouth when you are in the same room with other people and when you visit a healthcare provider. People who live with or visit you should also wear a facemask while they are in the same room with you.  Separate yourself from other people in your home As much as possible, you should stay in a different room from other people in your home. Also, you should use a separate bathroom, if available.  Avoid sharing household items You should not share dishes, drinking glasses, cups, eating utensils, towels, bedding, or other items with other people in your home.  After using these items, you should wash them thoroughly with soap and water.  Cover your coughs and sneezes Cover your mouth and nose with a tissue when you cough or sneeze, or you can cough or sneeze into your sleeve. Throw used tissues in a lined trash can, and immediately wash your hands with soap and water for at least 20 seconds or use an alcohol-based hand rub.  Wash your [facility] your hands often and thoroughly with soap and water for at least 20 seconds. You can use an alcohol-based  hand sanitizer if soap and water are not available and if your hands are not visibly dirty. Avoid touching your eyes, nose, and mouth with unwashed hands.   Prevention Steps for Caregivers and Household Members of Individuals Confirmed to have, or Being Evaluated for, COVID-19 Infection Being Cared for in the Home  If you live with, or provide care at home for, a person confirmed to have, or being evaluated for, COVID-19 infection please follow these guidelines to prevent infection:  Follow healthcare provider's instructions Make sure that you understand and can help the patient follow any healthcare provider instructions for all care.  Provide for the patient's basic needs You should help the patient with basic needs in the home and provide support for getting groceries, prescriptions, and other personal needs.  Monitor the patient's symptoms If they are getting sicker, call his or her medical provider and tell them that the patient has, or is being evaluated for, COVID-19 infection. This will help the healthcare provider's office take steps to keep other people from getting infected. Ask the healthcare provider to call the local or state health department.  Limit the number of people who have contact with the patient If possible, have only one caregiver for the patient. Other household members should stay in another home or place of residence. If this is not possible, they should stay in another room, or be separated from the patient as much as possible. Use a separate bathroom, if available. Restrict visitors who do not have an essential need to be in the home.  Keep older adults, very young children, and other sick people away from the patient Keep older adults, very young children, and those who have compromised immune systems or chronic health conditions away from the patient. This includes people with chronic heart, lung, or kidney conditions, diabetes, and cancer.  Ensure good  ventilation Make sure that shared spaces in the home have good air flow, such as from an air conditioner or an opened window, weather permitting.  Wash your hands often Wash your hands often and thoroughly with soap and water for at least 20 seconds. You can use an alcohol based hand sanitizer if soap and water are not available and if your hands are not visibly dirty. Avoid touching your eyes, nose, and mouth with unwashed hands. Use disposable paper towels to dry your hands. If not available, use dedicated cloth towels and replace them when they become wet.  Wear a facemask and gloves Wear a disposable facemask at all times in the room and gloves when you touch or have contact with the patient's blood, body fluids, and/or secretions or excretions, such as sweat, saliva, sputum, nasal mucus, vomit, urine, or feces.  Ensure the mask fits over your nose and mouth tightly, and do not touch it during use. Throw out disposable facemasks and gloves after using them. Do not reuse. Wash your hands immediately after removing your facemask and gloves. If your  personal clothing becomes contaminated, carefully remove clothing and launder. Wash your hands after handling contaminated clothing. Place all used disposable facemasks, gloves, and other waste in a lined container before disposing them with other household waste. Remove gloves and wash your hands immediately after handling these items.  Do not share dishes, glasses, or other household items with the patient Avoid sharing household items. You should not share dishes, drinking glasses, cups, eating utensils, towels, bedding, or other items with a patient who is confirmed to have, or being evaluated for, COVID-19 infection. After the person uses these items, you should wash them thoroughly with soap and water.  Wash laundry thoroughly Immediately remove and wash clothes or bedding that have blood, body fluids, and/or secretions or excretions, such as  sweat, saliva, sputum, nasal mucus, vomit, urine, or feces, on them. Wear gloves when handling laundry from the patient. Read and follow directions on labels of laundry or clothing items and detergent. In general, wash and dry with the warmest temperatures recommended on the label.  Clean all areas the individual has used often Clean all touchable surfaces, such as counters, tabletops, doorknobs, bathroom fixtures, toilets, phones, keyboards, tablets, and bedside tables, every day. Also, clean any surfaces that may have blood, body fluids, and/or secretions or excretions on them. Wear gloves when cleaning surfaces the patient has come in contact with. Use a diluted bleach solution (e.g., dilute bleach with 1 part bleach and 10 parts water) or a household disinfectant with a label that says EPA-registered for coronaviruses. To make a bleach solution at home, add 1 tablespoon of bleach to 1 quart (4 cups) of water. For a larger supply, add  cup of bleach to 1 gallon (16 cups) of water. Read labels of cleaning products and follow recommendations provided on product labels. Labels contain instructions for safe and effective use of the cleaning product including precautions you should take when applying the product, such as wearing gloves or eye protection and making sure you have good ventilation during use of the product. Remove gloves and wash hands immediately after cleaning.  Monitor yourself for signs and symptoms of illness Caregivers and household members are considered close contacts, should monitor their health, and will be asked to limit movement outside of the home to the extent possible. Follow the monitoring steps for close contacts listed on the symptom monitoring form.   ? If you have additional questions, contact your local health department or call the epidemiologist on call at [phone] (available 24/7). ? This guidance is subject to change. For the most up-to-date guidance from  [facility], please refer to their website: [email or web address].pl   Increase activity slowly   Complete by:  As directed    MyChart COVID-19 home monitoring program   Complete by:  Jan 29, 2019    Is the patient willing to use the [place] for home monitoring?:  Yes   Temperature monitoring   Complete by:  Jan 29, 2019    After how many days would you like to receive a notification of this patient's flowsheet entries?:  1     Allergies as of 01/29/2019   No Known Allergies     Medication List    STOP taking these medications   azithromycin 250 MG tablet Commonly known as:  ZITHROMAX     TAKE these medications   benzonatate 100 MG capsule Commonly known as:  TESSALON Take 1 capsule (100 mg total) by mouth 3 (three) times daily as needed for cough. What  changed:    when to take this  reasons to take this   blood glucose meter kit and supplies Dispense based on patient and insurance preference. Use up to four times daily as directed. (FOR ICD-10 E10.9, E11.9).   ferrous sulfate 325 (65 FE) MG tablet Take 1 tablet (325 mg total) by mouth 3 (three) times daily with meals.   metFORMIN 500 MG tablet Commonly known as:  Glucophage Take 1 tablet (500 mg total) by mouth 2 (two) times daily with a meal.      Follow-up Information    Alfonse Spruce, FNP. Schedule an appointment as soon as possible for a visit in 2 week(s).   Specialty:  Family Medicine Contact information: [place] [address] [phone]        Gastroenterology, [place]. Schedule an appointment as soon as possible for a visit in 2 week(s).   Contact information: [place] [address] [phone]          No Known Allergies  Consultations:   None  Other Procedures/Studies: Dg Chest Port 1 View  Result Date: 01/24/2019 CLINICAL DATA:  Shortness of breath EXAM: PORTABLE CHEST 1 VIEW  COMPARISON:  Two days ago FINDINGS: Subtle, patchy bilateral lung opacity. Normal heart size. No edema, effusion, or pneumothorax. IMPRESSION: Lower lung volumes with mild bilateral infiltrate. Electronically Signed   By: Monte Fantasia M.D.   On: 01/24/2019 10:59   Dg Chest Port 1 View  Result Date: 01/22/2019 CLINICAL DATA:  Cough and fever EXAM: PORTABLE CHEST 1 VIEW COMPARISON:  08/30/2009 FINDINGS: The cardiac silhouette is at the upper limits of normal with respect to size. There is no pneumothorax. No large pleural effusion. There is a density projecting over the ninth rib posteriorly on the left. There is no acute osseous abnormality. IMPRESSION: 1. No definite acute cardiopulmonary process. 2. Small opacity projecting over the posterior left ninth rib which may represent atelectasis, and infiltrate, or a pulmonary nodule. A 4-6 week follow-up two-view chest x-ray is recommended to confirm resolution of this finding. Electronically Signed   By: Constance Holster M.D.   On: 01/22/2019 02:04     TODAY-DAY OF DISCHARGE:  Subjective:   Glover Diaz-Cruz today has no headache,no chest abdominal pain,no new weakness tingling or numbness, feels much better wants to go home today.   Objective:   Blood pressure 126/76, pulse 71, temperature 97.8 F (36.6 C), temperature source Oral, resp. rate 18, height _0  (1.676 m), weight 91.5 kg, SpO2 96 %.  Intake/Output Summary (Last 24 hours) at 01/29/2019 0939 Last data filed at 01/29/2019 0740 Gross per 24 hour  Intake 240 ml  Output 1850 ml  Net -1610 ml   Filed Weights   01/24/19 0943 01/29/19 0526  Weight: 95 kg 91.5 kg    Exam: Awake Alert, Oriented *3, No new F.N deficits, Normal affect [place].AT,PERRAL Supple Neck,No JVD, No cervical lymphadenopathy appriciated.  Symmetrical Chest wall movement, Good air movement bilaterally, CTAB RRR,No Gallops,Rubs or new Murmurs, No Parasternal Heave +ve B.Sounds, Abd Soft, Non tender, No organomegaly  appriciated, No rebound -guarding or rigidity. No Cyanosis, Clubbing or edema, No new Rash or bruise   PERTINENT RADIOLOGIC STUDIES: Dg Chest Port 1 View  Result Date: 01/24/2019 CLINICAL DATA:  Shortness of breath EXAM: PORTABLE CHEST 1 VIEW COMPARISON:  Two days ago FINDINGS: Subtle, patchy bilateral lung opacity. Normal heart size. No edema, effusion, or pneumothorax. IMPRESSION: Lower lung volumes with mild  bilateral infiltrate. Electronically Signed   By: Monte Fantasia M.D.   On: 01/24/2019 10:59   Dg Chest Port 1 View  Result Date: 01/22/2019 CLINICAL DATA:  Cough and fever EXAM: PORTABLE CHEST 1 VIEW COMPARISON:  08/30/2009 FINDINGS: The cardiac silhouette is at the upper limits of normal with respect to size. There is no pneumothorax. No large pleural effusion. There is a density projecting over the ninth rib posteriorly on the left. There is no acute osseous abnormality. IMPRESSION: 1. No definite acute cardiopulmonary process. 2. Small opacity projecting over the posterior left ninth rib which may represent atelectasis, and infiltrate, or a pulmonary nodule. A 4-6 week follow-up two-view chest x-ray is recommended to confirm resolution of this finding. Electronically Signed   By: Constance Holster M.D.   On: 01/22/2019 02:04     PERTINENT LAB RESULTS: CBC: Recent Labs    01/27/19 0510 01/28/19 0204  WBC 10.3 10.4  HGB 7.5* 7.7*  HCT 27.0* 28.2*  PLT 599* 588*   CMET CMP     Component Value Date/Time   NA 137 01/29/2019 0230   NA 138 12/28/2016 1623   K 3.8 01/29/2019 0230   CL 105 01/29/2019 0230   CO2 23 01/29/2019 0230   GLUCOSE 92 01/29/2019 0230   BUN 16 01/29/2019 0230   BUN 14 12/28/2016 1623   CREATININE 0.74 01/29/2019 0230   CALCIUM 8.4 (L) 01/29/2019 0230   PROT 6.5 01/29/2019 0230   PROT 7.5 12/28/2016 1623   ALBUMIN 3.2 (L) 01/29/2019 0230   ALBUMIN 4.8 12/28/2016 1623   AST 18 01/29/2019 0230   ALT 25 01/29/2019 0230   ALKPHOS 74 01/29/2019 0230    BILITOT 0.3 01/29/2019 0230   BILITOT <0.2 12/28/2016 1623   GFRNONAA >60 01/29/2019 0230   GFRAA >60 01/29/2019 0230    GFR Estimated Creatinine Clearance: 118.3 mL/min (by C-G formula based on SCr of 0.74 mg/dL). No results for input(s): LIPASE, AMYLASE in the last 72 hours. Recent Labs    01/27/19 0510 01/28/19 0204  CKTOTAL 79 60   Invalid input(s): POCBNP Recent Labs    01/27/19 0510 01/28/19 0204  DDIMER 1.19* 1.74*   No results for input(s): HGBA1C in the last 72 hours. No results for input(s): CHOL, HDL, LDLCALC, TRIG, CHOLHDL, LDLDIRECT in the last 72 hours. No results for input(s): TSH, T4TOTAL, T3FREE, THYROIDAB in the last 72 hours.  Invalid input(s): FREET3 Recent Labs    01/27/19 0510 01/28/19 0204  FERRITIN 16* 13*   Coags: No results for input(s): INR in the last 72 hours.  Invalid input(s): PT Microbiology: Recent Results (from the past 240 hour(s))  Novel Coronavirus, NAA (hospital order; send-out to ref lab)     Status: Abnormal   Collection Time: 01/22/19  3:18 AM  Result Value Ref Range Status   SARS-CoV-2, NAA DETECTED (A) NOT DETECTED Final    Comment: RESULT CALLED TO, READ BACK BY AND VERIFIED WITH: NOTIFIED LORI BERDICK VIA EMAIL 01/23/19 D. VANHOOK (NOTE) This test was developed and its performance characteristics determined by [employer]. This test has not been FDA cleared or approved. This test has been authorized by FDA under an Emergency Use Authorization (EUA). This test is only authorized for the duration of time the declaration that circumstances exist justifying the authorization of the emergency use of in vitro diagnostic tests for detection of SARS-CoV-2 virus and/or diagnosis of COVID-19 infection under section 564(b)(1) of the Act, 21 U.S.C. 376EGB-1(D)(1), unless the authorization is terminated  or revoked sooner. When diagnostic testing is negative, the possibility of a false negative result should be considered  in the context of a patient's recent exposures and the presence of clinical signs and symptoms consistent with COVID-19. An individual without symptoms of COVID-19 and who is not shedd ing SARS-CoV-2 virus would expect to have a negative (not detected) result in this assay. Performed At: [facility] [address] [place], [place] [id number] Rush Farmer MD QM:[id number]    [place]  Final    Comment: Performed at [facility], [place] [address], [place], [place] [address]    FURTHER DISCHARGE INSTRUCTIONS:  Get Medicines reviewed and adjusted: Please take all your medications with you for your next visit with your Primary MD  Laboratory/radiological data: Please request your Primary MD to go over all hospital tests and procedure/radiological results at the follow up, please ask your Primary MD to get all Hospital records sent to his/her office.  In some cases, they will be blood work, cultures and biopsy results pending at the time of your discharge. Please request that your primary care M.D. goes through all the records of your hospital data and follows up on these results.  Also Note the following: If you experience worsening of your admission symptoms, develop shortness of breath, life threatening emergency, suicidal or homicidal thoughts you must seek medical attention immediately by calling 911 or calling your MD immediately  if symptoms less severe.  You must read complete instructions/literature along with all the possible adverse reactions/side effects for all the Medicines you take and that have been prescribed to you. Take any new Medicines after you have completely understood and accpet all the possible adverse reactions/side effects.   Do not drive when taking Pain medications or sleeping medications (Benzodaizepines)  Do not take more than prescribed Pain, Sleep and Anxiety Medications. It is not advisable to combine anxiety,sleep  and pain medications without talking with your primary care practitioner  Special Instructions: If you have smoked or chewed Tobacco  in the last 2 yrs please stop smoking, stop any regular Alcohol  and or any Recreational drug use.  Wear Seat belts while driving.  Please note: You were cared for by a hospitalist during your hospital stay. Once you are discharged, your primary care physician will handle any further medical issues. Please note that NO REFILLS for any discharge medications will be authorized once you are discharged, as it is imperative that you return to your primary care physician (or establish a relationship with a primary care physician if you do not have one) for your post hospital discharge needs so that they can reassess your need for medications and monitor your lab values.  Total Time spent coordinating discharge including counseling, education and face to face time equals 32 minutes.  SignedOren Binet 01/29/2019 9:39 AM

## 2019-01-29 NOTE — Discharge Instructions (Signed)
Person Under Monitoring Name: Tayvion Lauder  Location: [place] [address]   Infection Prevention Recommendations for Individuals Confirmed to have, or Being Evaluated for, 2019 Novel Coronavirus (COVID-19) Infection Who Receive Care at Home  Individuals who are confirmed to have, or are being evaluated for, COVID-19 should follow the prevention steps below until a healthcare provider or local or state health department says they can return to normal activities.  Stay home except to get medical care You should restrict activities outside your home, except for getting medical care. Do not go to work, school, or public areas, and do not use public transportation or taxis.  Call ahead before visiting your doctor Before your medical appointment, call the healthcare provider and tell them that you have, or are being evaluated for, COVID-19 infection. This will help the healthcare providers office take steps to keep other people from getting infected. Ask your healthcare provider to call the local or state health department.  Monitor your symptoms Seek prompt medical attention if your illness is worsening (e.g., difficulty breathing). Before going to your medical appointment, call the healthcare provider and tell them that you have, or are being evaluated for, COVID-19 infection. Ask your healthcare provider to call the local or state health department.  Wear a facemask You should wear a facemask that covers your nose and mouth when you are in the same room with other people and when you visit a healthcare provider. People who live with or visit you should also wear a facemask while they are in the same room with you.  Separate yourself from other people in your home As much as possible, you should stay in a different room from other people in your home. Also, you should use a separate bathroom, if available.  Avoid sharing household items You should not share  dishes, drinking glasses, cups, eating utensils, towels, bedding, or other items with other people in your home. After using these items, you should wash them thoroughly with soap and water.  Cover your coughs and sneezes Cover your mouth and nose with a tissue when you cough or sneeze, or you can cough or sneeze into your sleeve. Throw used tissues in a lined trash can, and immediately wash your hands with soap and water for at least 20 seconds or use an alcohol-based hand rub.  Wash your [facility] your hands often and thoroughly with soap and water for at least 20 seconds. You can use an alcohol-based hand sanitizer if soap and water are not available and if your hands are not visibly dirty. Avoid touching your eyes, nose, and mouth with unwashed hands.   Prevention Steps for Caregivers and Household Members of Individuals Confirmed to have, or Being Evaluated for, COVID-19 Infection Being Cared for in the Home  If you live with, or provide care at home for, a person confirmed to have, or being evaluated for, COVID-19 infection please follow these guidelines to prevent infection:  Follow healthcare providers instructions Make sure that you understand and can help the patient follow any healthcare provider instructions for all care.  Provide for the patients basic needs You should help the patient with basic needs in the home and provide support for getting groceries, prescriptions, and other personal needs.  Monitor the patients symptoms If they are getting sicker, call his or her medical provider and tell them that the patient has, or is being evaluated for, COVID-19 infection. This will help the healthcare providers office take  steps to keep other people from getting infected. Ask the healthcare provider to call the local or state health department.  Limit the number of people who have contact with the patient  If possible, have only one caregiver for the patient.  Other  household members should stay in another home or place of residence. If this is not possible, they should stay  in another room, or be separated from the patient as much as possible. Use a separate bathroom, if available.  Restrict visitors who do not have an essential need to be in the home.  Keep older adults, very young children, and other sick people away from the patient Keep older adults, very young children, and those who have compromised immune systems or chronic health conditions away from the patient. This includes people with chronic heart, lung, or kidney conditions, diabetes, and cancer.  Ensure good ventilation Make sure that shared spaces in the home have good air flow, such as from an air conditioner or an opened window, weather permitting.  Wash your hands often  Wash your hands often and thoroughly with soap and water for at least 20 seconds. You can use an alcohol based hand sanitizer if soap and water are not available and if your hands are not visibly dirty.  Avoid touching your eyes, nose, and mouth with unwashed hands.  Use disposable paper towels to dry your hands. If not available, use dedicated cloth towels and replace them when they become wet.  Wear a facemask and gloves  Wear a disposable facemask at all times in the room and gloves when you touch or have contact with the patients blood, body fluids, and/or secretions or excretions, such as sweat, saliva, sputum, nasal mucus, vomit, urine, or feces.  Ensure the mask fits over your nose and mouth tightly, and do not touch it during use.  Throw out disposable facemasks and gloves after using them. Do not reuse.  Wash your hands immediately after removing your facemask and gloves.  If your personal clothing becomes contaminated, carefully remove clothing and launder. Wash your hands after handling contaminated clothing.  Place all used disposable facemasks, gloves, and other waste in a lined container before  disposing them with other household waste.  Remove gloves and wash your hands immediately after handling these items.  Do not share dishes, glasses, or other household items with the patient  Avoid sharing household items. You should not share dishes, drinking glasses, cups, eating utensils, towels, bedding, or other items with a patient who is confirmed to have, or being evaluated for, COVID-19 infection.  After the person uses these items, you should wash them thoroughly with soap and water.  Wash laundry thoroughly  Immediately remove and wash clothes or bedding that have blood, body fluids, and/or secretions or excretions, such as sweat, saliva, sputum, nasal mucus, vomit, urine, or feces, on them.  Wear gloves when handling laundry from the patient.  Read and follow directions on labels of laundry or clothing items and detergent. In general, wash and dry with the warmest temperatures recommended on the label.  Clean all areas the individual has used often  Clean all touchable surfaces, such as counters, tabletops, doorknobs, bathroom fixtures, toilets, phones, keyboards, tablets, and bedside tables, every day. Also, clean any surfaces that may have blood, body fluids, and/or secretions or excretions on them.  Wear gloves when cleaning surfaces the patient has come in contact with.  Use a diluted bleach solution (e.g., dilute bleach with 1 part bleach  and 10 parts water) or a household disinfectant with a label that says EPA-registered for coronaviruses. To make a bleach solution at home, add 1 tablespoon of bleach to 1 quart (4 cups) of water. For a larger supply, add  cup of bleach to 1 gallon (16 cups) of water.  Read labels of cleaning products and follow recommendations provided on product labels. Labels contain instructions for safe and effective use of the cleaning product including precautions you should take when applying the product, such as wearing gloves or eye protection  and making sure you have good ventilation during use of the product.  Remove gloves and wash hands immediately after cleaning.  Monitor yourself for signs and symptoms of illness Caregivers and household members are considered close contacts, should monitor their health, and will be asked to limit movement outside of the home to the extent possible. Follow the monitoring steps for close contacts listed on the symptom monitoring form.   ? If you have additional questions, contact your local health department or call the epidemiologist on call at [phone] (available 24/7). ? This guidance is subject to change. For the most up-to-date guidance from [facility], please refer to their website: [email or web address]      Person Under Monitoring Name: [facility]  Location: [address] [place] [address]   CORONAVIRUS DISEASE 2019 (COVID-19) Guidance for Persons Under Investigation You are being tested for the virus that causes coronavirus disease 2019 (COVID-19). Public health actions are necessary to ensure protection of your health and the health of others, and to prevent further spread of infection. COVID-19 is caused by a virus that can cause symptoms, such as fever, cough, and shortness of breath. The primary transmission from person to person is by coughing or sneezing. On September 19, 2018, the [place] announced a [employer] Emergency of International Concern and on September 20, 2018 the U.S. Department of Health and Human Services declared a public health emergency. If the virus that causesCOVID-19 spreads in the community, it could have severe public health consequences.  As a person under investigation for COVID-19, the [facility] advises you to adhere to the following guidance until your test results are reported to you. If your test  result is positive, you will receive additional information from your provider and your local health department at that time.   Remain at home until you are cleared by your health provider or public health authorities.   Keep a log of visitors to your home using the form provided. Any visitors to your home must be aware of your isolation status.  If you plan to move to a new address or leave the county, notify the local health department in your county.  Call a doctor or seek care if you have an urgent medical need. Before seeking medical care, call ahead and get instructions from the provider before arriving at the medical office, clinic or hospital. Notify them that you are being tested for the virus that causes COVID-19 so arrangements can be made, as necessary, to prevent transmission to others in the healthcare setting. Next, notify the local health department in your county.  If a medical emergency arises and you need to call 911, inform the first responders that you are being tested for the virus that causes COVID-19. Next, notify the local health department in your county.  Adhere to all guidance set forth by the [facility] for Home Care of patients that is based on guidance from the Center for Disease Control and Prevention with suspected or confirmed COVID-19. It is provided with this guidance for Persons Under Investigation.  Your health and the health of our community are our top priorities. Public Health officials remain available to provide assistance and counseling to you about COVID-19 and compliance with this guidance.  Provider: ____________________________________________________________ Date: ______/_____/_________  By signing below, you acknowledge that you have read and agree to comply with this Guidance for Persons Under Investigation. ______________________________________________________________ Date: ______/_____/_________  WHO DO I  CALL? You can find a list of local health departments here: [email or web address] Health Department: ____________________________________________________________________ Contact Name: ________________________________________________________________________ Telephone: ___________________________________________________________________________  Marice Potter, [place], Communicable Disease Branch COVID-19 Guidance for Persons Under Investigation October 26, 2018

## 2019-02-06 ENCOUNTER — Inpatient Hospital Stay: Payer: Self-pay | Admitting: Primary Care

## 2019-02-06 ENCOUNTER — Ambulatory Visit: Payer: HRSA Program | Attending: Primary Care | Admitting: Internal Medicine

## 2019-02-06 ENCOUNTER — Other Ambulatory Visit: Payer: Self-pay

## 2019-02-06 DIAGNOSIS — U071 COVID-19: Secondary | ICD-10-CM | POA: Diagnosis not present

## 2019-02-06 DIAGNOSIS — D509 Iron deficiency anemia, unspecified: Secondary | ICD-10-CM | POA: Diagnosis not present

## 2019-02-06 DIAGNOSIS — E119 Type 2 diabetes mellitus without complications: Secondary | ICD-10-CM | POA: Insufficient documentation

## 2019-02-06 DIAGNOSIS — Z09 Encounter for follow-up examination after completed treatment for conditions other than malignant neoplasm: Secondary | ICD-10-CM

## 2019-02-06 DIAGNOSIS — J1289 Other viral pneumonia: Secondary | ICD-10-CM

## 2019-02-06 NOTE — Progress Notes (Signed)
Virtual Visit via Telephone Note Due to current restrictions/limitations of in-office visits due to the COVID-19 pandemic, this scheduled clinical appointment was converted to a telehealth visit  I connected with Ricardo Ward on 02/06/19 at 4:24 p.m EDT by telephone and verified that I am speaking with the correct person using two identifiers. I am in my office.  The patient is at home.  Only the patient,myself and Christian from [employer] [phone]) participated in this encounter.  I discussed the limitations, risks, security and privacy concerns of performing an evaluation and management service by telephone and the availability of in person appointments. I also discussed with the patient that there may be a patient responsible charge related to this service. The patient expressed understanding and agreed to proceed.  History of Present Illness: Purpose of today's visit was hospital follow-up.  Patient hospitalized 6/5-05/2019 with acute hypoxic respiratory failure due to COVID-19 viral pneumonia.  Patient was not intubated.  He was treated with Remdesivir with significant improvement in his respiratory symptoms.  He was able to be discharged in stable condition without oxygen.  During hospital course he was found to have diabetes type 2 with a hemoglobin of 6.5.  He was started on metformin.  He was also found to have iron deficiency anemia without overt GI bleeding.  Hemoglobin remained stable in the 7s.  He was discharged home with iron supplement.  Today:   Feeling much better. Mild residual cough, no fever or myalgias.   No SOB.  Eating well.  DM: checking BS daily.  Range 117-154 Reports he has changed his eating habits; eating more healthy Walking around a field close to his house daily for exercise.  He wears mask  Compliant with Metformin  Anemia: looks like microcytic anemia was chronic but worse compared to a few yrs ago -he has been noticing blood in stools  intermittently for 3 yrs.  Told in past that he has hemorrhoids.  -no fhx of colon cancer -never had colonoscopy   Outpatient Encounter Medications as of 02/06/2019  Medication Sig  . benzonatate (TESSALON) 100 MG capsule Take 1 capsule (100 mg total) by mouth 3 (three) times daily as needed for cough.  . blood glucose meter kit and supplies Dispense based on patient and insurance preference. Use up to four times daily as directed. (FOR ICD-10 E10.9, E11.9).  . ferrous sulfate 325 (65 FE) MG tablet Take 1 tablet (325 mg total) by mouth 3 (three) times daily with meals.  . metFORMIN (GLUCOPHAGE) 500 MG tablet Take 1 tablet (500 mg total) by mouth 2 (two) times daily with a meal.   Facility-Administered Encounter Medications as of 02/06/2019  Medication  . mitoMYcin (MUTAMYCIN) Injection Use in OR only (0.4 mg/ml)     Observations/Objective: Lab Results  Component Value Date   WBC 10.4 01/28/2019   HGB 7.7 (L) 01/28/2019   HCT 28.2 (L) 01/28/2019   MCV 62.9 (L) 01/28/2019   PLT 588 (H) 01/28/2019   Lab Results  Component Value Date   IRON 11 (L) 01/24/2019   TIBC 389 01/24/2019   FERRITIN 13 (L) 01/28/2019     Chemistry      Component Value Date/Time   NA 137 01/29/2019 0230   NA 138 12/28/2016 1623   K 3.8 01/29/2019 0230   CL 105 01/29/2019 0230   CO2 23 01/29/2019 0230   BUN 16 01/29/2019 0230   BUN 14 12/28/2016 1623   CREATININE 0.74 01/29/2019 0230  Component Value Date/Time   CALCIUM 8.4 (L) 01/29/2019 0230   ALKPHOS 74 01/29/2019 0230   AST 18 01/29/2019 0230   ALT 25 01/29/2019 0230   BILITOT 0.3 01/29/2019 0230   BILITOT <0.2 12/28/2016 1623      Assessment and Plan: 1. Hospital discharge follow-up 2. Pneumonia due to COVID-19 virus -doing well post treatment Encouraged social distancing, good hand washing and wearing of mask when in public  3. Iron deficiency anemia, unspecified iron deficiency anemia type -Advised patient to have a friend or  family member pick up the orange card/cone discount card from our front desk.  He will need to fill them out and supply supporting documents.  Once completed the needs to be returned to [place].  Once he is approved for the orange card/cone discount we will refer to gastroenterology.  4. New onset type 2 diabetes mellitus ([place]) Commended him on dietary changes.  Encouraged him to keep up the good work.  Encouraged him to continue regular exercise.  He will continue metformin.   Follow Up Instructions: F/u in 1 mth   I discussed the assessment and treatment plan with the patient. The patient was provided an opportunity to ask questions and all were answered. The patient agreed with the plan and demonstrated an understanding of the instructions.   The patient was advised to call back or seek an in-person evaluation if the symptoms worsen or if the condition fails to improve as anticipated.  I provided 16 minutes of non-face-to-face time during this encounter.   Karle Plumber, MD

## 2019-02-06 NOTE — Progress Notes (Signed)
Pt states he has a cough in the morning when he wakes up

## 2019-03-03 ENCOUNTER — Ambulatory Visit: Payer: Self-pay

## 2019-03-14 ENCOUNTER — Ambulatory Visit: Payer: Self-pay | Attending: Internal Medicine

## 2019-03-14 ENCOUNTER — Other Ambulatory Visit: Payer: Self-pay

## 2019-03-17 ENCOUNTER — Other Ambulatory Visit: Payer: Self-pay

## 2019-03-17 ENCOUNTER — Encounter: Payer: Self-pay | Admitting: Internal Medicine

## 2019-03-17 ENCOUNTER — Ambulatory Visit: Payer: Self-pay | Attending: Internal Medicine | Admitting: Internal Medicine

## 2019-03-17 DIAGNOSIS — K625 Hemorrhage of anus and rectum: Secondary | ICD-10-CM | POA: Insufficient documentation

## 2019-03-17 DIAGNOSIS — K5900 Constipation, unspecified: Secondary | ICD-10-CM

## 2019-03-17 DIAGNOSIS — E119 Type 2 diabetes mellitus without complications: Secondary | ICD-10-CM

## 2019-03-17 DIAGNOSIS — D509 Iron deficiency anemia, unspecified: Secondary | ICD-10-CM | POA: Insufficient documentation

## 2019-03-17 DIAGNOSIS — R300 Dysuria: Secondary | ICD-10-CM

## 2019-03-17 MED ORDER — CIPROFLOXACIN HCL 500 MG PO TABS: 500.0000 mg | ORAL_TABLET | Freq: Two times a day (BID) | ORAL | 0 refills | Status: DC

## 2019-03-17 MED ORDER — FERROUS SULFATE 325 (65 FE) MG PO TABS: 325.0000 mg | ORAL_TABLET | Freq: Every day | ORAL | 1 refills | Status: DC

## 2019-03-17 MED ORDER — POLYETHYLENE GLYCOL 3350 17 GM/SCOOP PO POWD: 17.0000 g | Freq: Every day | ORAL | 2 refills | Status: DC

## 2019-03-17 MED ORDER — DOCUSATE SODIUM 100 MG PO CAPS: 100.0000 mg | ORAL_CAPSULE | Freq: Every day | ORAL | 3 refills | Status: DC

## 2019-03-17 MED ORDER — METFORMIN HCL 500 MG PO TABS: 500.0000 mg | ORAL_TABLET | Freq: Two times a day (BID) | ORAL | 4 refills | Status: DC

## 2019-03-17 MED ORDER — HYDROCORTISONE ACETATE 25 MG RE SUPP: 25.0000 mg | Freq: Two times a day (BID) | RECTAL | 2 refills | Status: DC | PRN

## 2019-03-17 NOTE — Progress Notes (Signed)
1 mo f/u for Covid 19 and he feels fine  Per pt he feels like he have a cold due to [facility]   Urinary Frequency  Constipation   CBG at home this morning was 119

## 2019-03-17 NOTE — Progress Notes (Signed)
Virtual Visit via Telephone Note Due to current restrictions/limitations of in-office visits due to the COVID-19 pandemic, this scheduled clinical appointment was converted to a telehealth visit  I connected with Ricardo Ward on 03/17/19 at 4:30 p.m by telephone and verified that I am speaking with the correct person using two identifiers. I am in my office.  The patient is at home.  Only the patient, myself and Clifton James from [employer] [phone]) participated in this encounter.  I discussed the limitations, risks, security and privacy concerns of performing an evaluation and management service by telephone and the availability of in person appointments. I also discussed with the patient that there may be a patient responsible charge related to this service. The patient expressed understanding and agreed to proceed.   History of Present Illness: History of DM type II, IDA, history COVID-19 pneumonia.  Last evaluation was 02/06/2019 post hospital for COVID pneumonia.  Purpose of today's visit is f/u chronic ds management.    IDA:  Since last visit, he applied for the [employer] discount but it was denied. Pt states he needs some additional supporting document which he is in the process of trying to get. Still taking iron supplement 3 times a day. He reports chronic rectal bleeding for the past 2 years.  He attributes this to hemorrhoids which flare intermittently.  And the hemorrhoids are inflamed, it is uncomfortable when he sits down.  Currently having a flare.  Stools are hard.  DM:  Eating less and healthier.  Walks 3 x a wk for exercise No blurred vision.  Unable to afford vision exam.  No blurred vision. Compliant with Metformin  Pt c/o pain and burning on urination since yesterday.  No hematuria or penile discharge.  Last sexual activity was 2 mths ago.    Observations/Objective: No direct observation done as this was a telephone encounter.  Lab Results  Component Value Date   HGBA1C 6.6 (H) 01/26/2019     Assessment and Plan: 1. Controlled type 2 diabetes mellitus without complication, without long-term current use of insulin (HCC) Continue metformin, healthy eating and regular exercise. Recommend getting an eye exam when he is able to afford to do so - metFORMIN (GLUCOPHAGE) 500 MG tablet; Take 1 tablet (500 mg total) by mouth 2 (two) times daily with a meal.  Dispense: 60 tablet; Refill: 4  2. Dysuria He is unable to come to the lab today but is willing to come tomorrow.  We will do a urinalysis, culture and urine cytology to screen for STD -In the meantime I will put him on Cipro which he plans to pick up today from an outside pharmacy. - ciprofloxacin (CIPRO) 500 MG tablet; Take 1 tablet (500 mg total) by mouth 2 (two) times daily.  Dispense: 14 tablet; Refill: 0  3. Iron deficiency anemia, unspecified iron deficiency anemia type 4. Rectal bleeding -I have encouraged him to try and get the process completed for the orange card/cone discount as soon as possible so that we can refer him to gastroenterology. -It sounds as though he is having some hemorrhoids.  I discussed management including the importance of keeping bowel movements soft and regular.  He has some constipation likely due to the iron supplement.  Start Colace and MiraLAX.  Recommend sitz baths -Change iron to once a day dosing.  Check CBC - ferrous sulfate 325 (65 FE) MG tablet; Take 1 tablet (325 mg total) by mouth daily with breakfast.  Dispense: 100 tablet; Refill: 1 - CBC;  Future - hydrocortisone (ANUSOL-HC) 25 MG suppository; Place 1 suppository (25 mg total) rectally 2 (two) times daily as needed for hemorrhoids or anal itching.  Dispense: 12 suppository; Refill: 2  5. Constipation, unspecified constipation type - polyethylene glycol powder (GLYCOLAX/MIRALAX) 17 GM/SCOOP powder; Take 17 g by mouth daily.  Dispense: 3350 g; Refill: 2 - docusate sodium (COLACE) 100 MG capsule; Take 1 capsule  (100 mg total) by mouth daily.  Dispense: 30 capsule; Refill: 3  Follow Up Instructions: F/u in 6 wks   I discussed the assessment and treatment plan with the patient. The patient was provided an opportunity to ask questions and all were answered. The patient agreed with the plan and demonstrated an understanding of the instructions.   The patient was advised to call back or seek an in-person evaluation if the symptoms worsen or if the condition fails to improve as anticipated.  I provided 32 minutes of non-face-to-face time during this encounter.   Karle Plumber, MD

## 2019-03-18 ENCOUNTER — Ambulatory Visit: Payer: Self-pay | Attending: Family Medicine

## 2019-03-18 DIAGNOSIS — R300 Dysuria: Secondary | ICD-10-CM

## 2019-03-18 DIAGNOSIS — D509 Iron deficiency anemia, unspecified: Secondary | ICD-10-CM

## 2019-03-18 MED FILL — metFORMIN HCL 500 MG TABS: 500 | 30 days supply | Qty: 60 | Fill #0

## 2019-03-18 MED FILL — FERROUS SULFATE 325 MG TAB: 325 (65 FE) | 30 days supply | Qty: 30 | Fill #0

## 2019-03-18 MED FILL — POLYETHYLENE GLYCOL 3350 PO: 17 | 14 days supply | Qty: 238 | Fill #0

## 2019-03-18 MED FILL — HYDROCORTISONE ACETATE 25 M: 25 | 6 days supply | Qty: 12 | Fill #0

## 2019-03-19 ENCOUNTER — Telehealth: Payer: Self-pay | Admitting: Internal Medicine

## 2019-03-19 LAB — URINALYSIS
Bilirubin, UA: NEGATIVE
Glucose, UA: NEGATIVE
Nitrite, UA: NEGATIVE
Specific Gravity, UA: 1.022 (ref 1.005–1.030)
Urobilinogen, Ur: 0.2 mg/dL (ref 0.2–1.0)
pH, UA: 5 (ref 5.0–7.5)

## 2019-03-19 LAB — CBC
Hematocrit: 39.9 % (ref 37.5–51.0)
Hemoglobin: 12.7 g/dL — ABNORMAL LOW (ref 13.0–17.7)
MCH: 26.5 pg — ABNORMAL LOW (ref 26.6–33.0)
MCHC: 31.8 g/dL (ref 31.5–35.7)
MCV: 83 fL (ref 79–97)
Platelets: 271 10*3/uL (ref 150–450)
RBC: 4.79 x10E6/uL (ref 4.14–5.80)
WBC: 14.4 10*3/uL — ABNORMAL HIGH (ref 3.4–10.8)

## 2019-03-20 LAB — URINE CYTOLOGY ANCILLARY ONLY
Chlamydia: NEGATIVE
Neisseria Gonorrhea: NEGATIVE
Trichomonas: NEGATIVE

## 2019-03-20 LAB — URINE CULTURE: Organism ID, Bacteria: NO GROWTH

## 2019-03-31 ENCOUNTER — Telehealth: Payer: Self-pay

## 2019-03-31 NOTE — Telephone Encounter (Signed)
[employer] [place]more [place]  Id#  [id number] contacted pt to go over lab/urine results pt is aware and doesn't have any questions or concerns

## 2019-05-02 ENCOUNTER — Other Ambulatory Visit: Payer: Self-pay

## 2019-05-02 ENCOUNTER — Ambulatory Visit: Payer: Self-pay | Attending: Internal Medicine | Admitting: Internal Medicine

## 2019-05-02 ENCOUNTER — Encounter: Payer: Self-pay | Admitting: Internal Medicine

## 2019-05-02 ENCOUNTER — Ambulatory Visit (HOSPITAL_BASED_OUTPATIENT_CLINIC_OR_DEPARTMENT_OTHER): Payer: Self-pay | Admitting: Pharmacist

## 2019-05-02 VITALS — BP 157/92 | HR 57 | Temp 98.2°F | Wt 195.2 lb

## 2019-05-02 DIAGNOSIS — I10 Essential (primary) hypertension: Secondary | ICD-10-CM | POA: Insufficient documentation

## 2019-05-02 DIAGNOSIS — Z23 Encounter for immunization: Secondary | ICD-10-CM

## 2019-05-02 DIAGNOSIS — I152 Hypertension secondary to endocrine disorders: Secondary | ICD-10-CM | POA: Insufficient documentation

## 2019-05-02 DIAGNOSIS — E119 Type 2 diabetes mellitus without complications: Secondary | ICD-10-CM

## 2019-05-02 DIAGNOSIS — D509 Iron deficiency anemia, unspecified: Secondary | ICD-10-CM

## 2019-05-02 DIAGNOSIS — R030 Elevated blood-pressure reading, without diagnosis of hypertension: Secondary | ICD-10-CM

## 2019-05-02 DIAGNOSIS — E1159 Type 2 diabetes mellitus with other circulatory complications: Secondary | ICD-10-CM | POA: Insufficient documentation

## 2019-05-02 DIAGNOSIS — M25561 Pain in right knee: Secondary | ICD-10-CM

## 2019-05-02 LAB — POCT GLYCOSYLATED HEMOGLOBIN (HGB A1C): HbA1c, POC (controlled diabetic range): 5.8 % (ref 0.0–7.0)

## 2019-05-02 LAB — GLUCOSE, POCT (MANUAL RESULT ENTRY): POC Glucose: 104 mg/dl — AB (ref 70–99)

## 2019-05-02 MED ORDER — DICLOFENAC SODIUM 1 % TD GEL: 2.0000 g | Freq: Four times a day (QID) | TRANSDERMAL | 2 refills | Status: DC

## 2019-05-02 MED ORDER — FERROUS SULFATE 325 (65 FE) MG PO TABS: 325.0000 mg | ORAL_TABLET | Freq: Every day | ORAL | 1 refills | Status: DC

## 2019-05-02 MED ORDER — METFORMIN HCL 500 MG PO TABS: 500.0000 mg | ORAL_TABLET | Freq: Two times a day (BID) | ORAL | 4 refills | Status: DC

## 2019-05-02 MED FILL — DICLOFENAC SODIUM 1% GEL: 1 | 12 days supply | Qty: 100 | Fill #0

## 2019-05-02 MED FILL — FERROUS SULFATE 325 MG TAB: 325 (65 FE) | 30 days supply | Qty: 30 | Fill #0

## 2019-05-02 MED FILL — metFORMIN HCL 500 MG TABS: 500 | 30 days supply | Qty: 60 | Fill #0

## 2019-05-02 NOTE — Progress Notes (Signed)
Patient presents for vaccination against S. pneumo and influenza per orders of Dr. Wynetta Emery. Consent given. Counseling provided. No contraindications exists. Vaccine administered without incident.

## 2019-05-02 NOTE — Patient Instructions (Signed)
PLEASE GIVE PATIENT AN APPOINTMENT WITH [facility] IN 2 WEEKS FOR REPEAT BLOOD PRESSURE CHECK

## 2019-05-02 NOTE — Progress Notes (Signed)
Patient ID: Ricardo Ward, male    DOB: 12-Dec-1970  MRN: [id number]  CC: Diabetes and Rectal Pain   Subjective: Ricardo Ward is a 48 y.o. male who presents for chronic ds management.  His concerns today include:  IDA, DM, hx COVID infection  IDA/rectal bleeding:  Still has not finished the application process for [facility] discount.  He states that he now has the documents which he was lacking the last time he had applied.  He plans to schedule an appointment with the financial counselor. -last cbc revealed improved H/H from 7.7/28 to 12.7/39.9 reports compliance with taking the iron supplement. BM sometimes hard, sometimes soft.  Taking Miralax which is helpful in keeping bowel movements regular.  Still gets blood in stools intermittently.   DM:   checks BS 2-3 x a wk. range 97-107.Marland Kitchen Eating habits:  Good Exercise: States that he stays fairly active. Eye exam:  Plans to have eye exam done within the next 1 mth  RT knee pain x 2 wks, deep pain which aches in the front and back of the knee Constant pain No known injury Use to hurt intermittently when he worked and did a lot of standing.  However over the past 2 weeks the pain has become constant.  He has noticed a little bit of swelling medially.  Results for orders placed or performed in visit on 05/02/19  POCT glucose (manual entry)  Result Value Ref Range   POC Glucose 104 (A) 70 - 99 mg/dl  POCT glycosylated hemoglobin (Hb A1C)  Result Value Ref Range   Hemoglobin A1C     HbA1c POC (<> result, manual entry)     HbA1c, POC (prediabetic range)     HbA1c, POC (controlled diabetic range) 5.8 0.0 - 7.0 %     Patient Active Problem List   Diagnosis Date Noted  . Iron deficiency anemia 03/17/2019  . Rectal bleeding 03/17/2019  . Constipation 03/17/2019  . New onset type 2 diabetes mellitus ([place]) 02/06/2019  . Microcytic anemia 01/24/2019  . Knee pain, right 03/05/2017  . Plantar fasciitis of left foot 03/05/2017  .  Pterygium of both eyes 05/26/2013     Current Outpatient Medications on File Prior to Visit  Medication Sig Dispense Refill  . blood glucose meter kit and supplies Dispense based on patient and insurance preference. Use up to four times daily as directed. (FOR ICD-10 E10.9, E11.9). 1 each 0  . docusate sodium (COLACE) 100 MG capsule Take 1 capsule (100 mg total) by mouth daily. (Patient not taking: Reported on 05/02/2019) 30 capsule 3  . hydrocortisone (ANUSOL-HC) 25 MG suppository Place 1 suppository (25 mg total) rectally 2 (two) times daily as needed for hemorrhoids or anal itching. (Patient not taking: Reported on 05/02/2019) 12 suppository 2  . polyethylene glycol powder (GLYCOLAX/MIRALAX) 17 GM/SCOOP powder Take 17 g by mouth daily. (Patient not taking: Reported on 05/02/2019) 3350 g 2   No current facility-administered medications on file prior to visit.     No Known Allergies  Social History   Socioeconomic History  . Marital status: Married    Spouse name: Not on file  . Number of children: Not on file  . Years of education: Not on file  . Highest education level: Not on file  Occupational History  . Not on file  Social Needs  . Financial resource strain: Not on file  . Food insecurity    Worry: Not on file    Inability: Not on  file  . Transportation needs    Medical: Not on file    Non-medical: Not on file  Tobacco Use  . Smoking status: Former Smoker    Packs/day: 1.00    Years: 13.00    Pack years: 13.00    Types: Cigarettes    Quit date: 12/10/1998    Years since quitting: 20.4  . Smokeless tobacco: Never Used  Substance and Sexual Activity  . Alcohol use: No  . Drug use: No  . Sexual activity: Not on file  Lifestyle  . Physical activity    Days per week: Not on file    Minutes per session: Not on file  . Stress: Not on file  Relationships  . Social Herbalist on phone: Not on file    Gets together: Not on file    Attends religious service:  Not on file    Active member of club or organization: Not on file    Attends meetings of clubs or organizations: Not on file    Relationship status: Not on file  . Intimate partner violence    Fear of current or ex partner: Not on file    Emotionally abused: Not on file    Physically abused: Not on file    Forced sexual activity: Not on file  Other Topics Concern  . Not on file  Social History Narrative   Needs interpreter for Spanish    Family History  Family history unknown: Yes    Past Surgical History:  Procedure Laterality Date  . BACK SURGERY    . EYE SURGERY Bilateral 06   pterygium   . PTERYGIUM EXCISION Bilateral   . PTERYGIUM EXCISION Right 11/12/2013   Procedure: PTERYGIUM EXCISION WITH MITOMYCIN C AND AMNIOTIC MEMBRANE USING TISSEEL GLUE RIGHT EYE;  Surgeon: Marylynn Pearson, MD;  Location: [facility];  Service: Ophthalmology;  Laterality: Right;    ROS: Review of Systems Negative except as stated above  PHYSICAL EXAM: BP (!) 157/92   Pulse (!) 57   Temp 98.2 F (36.8 C) (Oral)   Wt 195 lb 3.2 oz (88.5 kg)   SpO2 98%   BMI 31.51 kg/m   Physical Exam General appearance - alert, well appearing, and in no distress Mental status - normal mood, behavior, speech, dress, motor activity, and thought processes Neck - supple, no significant adenopathy Chest - clear to auscultation, no wheezes, rales or rhonchi, symmetric air entry Heart - normal rate, regular rhythm, normal S1, S2, no murmurs, rubs, clicks or gallops Musculoskeletal -right knee: No edema or erythema.  Slight point tenderness medially.  Good passive range of motion.  No crepitus appreciated Extremities - peripheral pulses normal, no pedal edema, no clubbing or cyanosis  CMP Latest Ref Rng & Units 01/29/2019 01/28/2019 01/27/2019  Glucose 70 - 99 mg/dL 92 106(H) 198(H)  BUN 6 - 20 mg/dL 16 17 17   Creatinine 0.61 - 1.24 mg/dL 0.74 0.76 0.66  Sodium 135 - 145 mmol/L 137 138 137  Potassium 3.5 - 5.1 mmol/L 3.8  3.3(L) 4.2  Chloride 98 - 111 mmol/L 105 103 104  CO2 22 - 32 mmol/L 23 24 23   Calcium 8.9 - 10.3 mg/dL 8.4(L) 8.4(L) 8.7(L)  Total Protein 6.5 - 8.1 g/dL 6.5 7.0 7.3  Total Bilirubin 0.3 - 1.2 mg/dL 0.3 0.2(L) 0.2(L)  Alkaline Phos 38 - 126 U/L 74 74 73  AST 15 - 41 U/L 18 24 27   ALT 0 - 44 U/L 25 34 37  Lipid Panel     Component Value Date/Time   CHOL 166 12/28/2016 1623   TRIG 71 01/25/2019 0917   HDL 51 12/28/2016 1623   CHOLHDL 3.3 12/28/2016 1623   LDLCALC 98 12/28/2016 1623    CBC    Component Value Date/Time   WBC 14.4 (H) 03/18/2019 1628   WBC 10.4 01/28/2019 0204   RBC 4.79 03/18/2019 1628   RBC 4.48 01/28/2019 0204   HGB 12.7 (L) 03/18/2019 1628   HCT 39.9 03/18/2019 1628   PLT 271 03/18/2019 1628   MCV 83 03/18/2019 1628   MCH 26.5 (L) 03/18/2019 1628   MCH 17.2 (L) 01/28/2019 0204   MCHC 31.8 03/18/2019 1628   MCHC 27.3 (L) 01/28/2019 0204   RDW 20.8 (H) 01/28/2019 0204   RDW 21.4 (H) 12/28/2016 1623   LYMPHSABS 1.9 01/28/2019 0204   LYMPHSABS 2.0 12/28/2016 1623   MONOABS 1.0 01/28/2019 0204   EOSABS 0.1 01/28/2019 0204   EOSABS 1.1 (H) 12/28/2016 1623   BASOSABS 0.1 01/28/2019 0204   BASOSABS 0.1 12/28/2016 1623    ASSESSMENT AND PLAN: 1. Controlled type 2 diabetes mellitus without complication, without long-term current use of insulin (HCC) Continue metformin, healthy eating habits and regular exercise.  Encouraged him to follow through on getting his eye exam done. - POCT glucose (manual entry) - POCT glycosylated hemoglobin (Hb A1C) - Microalbumin / creatinine urine ratio - metFORMIN (GLUCOPHAGE) 500 MG tablet; Take 1 tablet (500 mg total) by mouth 2 (two) times daily with a meal.  Dispense: 60 tablet; Refill: 4 - Lipid panel - Hepatic Function Panel  2. Iron deficiency anemia, unspecified iron deficiency anemia type Advised patient that once he has met with the financial counselor and has been approved for the orange card/cone discount,  please have him send me a message so that I can go ahead and submit referral for him to see the gastroenterologist to have colonoscopy and EGD - ferrous sulfate 325 (65 FE) MG tablet; Take 1 tablet (325 mg total) by mouth daily with breakfast.  Dispense: 100 tablet; Refill: 1 - CBC  3. Acute pain of right knee Questionable etiology.  We will try him with some Voltaren gel to use as needed. - diclofenac sodium (VOLTAREN) 1 % GEL; Apply 2 g topically 4 (four) times daily.  Dispense: 100 g; Refill: 2  4. Need for influenza vaccination Given  5. Need for vaccination against Streptococcus pneumoniae Given  6. Elevated blood pressure reading DASH diet discussed and encouraged.  Patient to see the clinical pharmacist in 2 weeks for repeat blood pressure check.  If still elevated we should start him on lisinopril.    Patient was given the opportunity to ask questions.  Patient verbalized understanding of the plan and was able to repeat key elements of the plan.  [id number] Orders Placed This Encounter  Procedures  . Microalbumin / creatinine urine ratio  . Lipid panel  . Hepatic Function Panel  . CBC  . POCT glucose (manual entry)  . POCT glycosylated hemoglobin (Hb A1C)     Requested Prescriptions   Signed Prescriptions Disp Refills  . ferrous sulfate 325 (65 FE) MG tablet 100 tablet 1    Sig: Take 1 tablet (325 mg total) by mouth daily with breakfast.  . metFORMIN (GLUCOPHAGE) 500 MG tablet 60 tablet 4    Sig: Take 1 tablet (500 mg total) by mouth 2 (two) times daily with a meal.  . diclofenac sodium (VOLTAREN) 1 % GEL 100  g 2    Sig: Apply 2 g topically 4 (four) times daily.    Return in about 2 months (around 07/02/2019).  Karle Plumber, MD, FACP

## 2019-05-03 LAB — LIPID PANEL
Chol/HDL Ratio: 3.1 ratio (ref 0.0–5.0)
Cholesterol, Total: 178 mg/dL (ref 100–199)
HDL: 58 mg/dL (ref >39)
LDL Chol Calc (NIH): 105 mg/dL — ABNORMAL HIGH (ref 0–99)
Triglycerides: 84 mg/dL (ref 0–149)
VLDL Cholesterol Cal: 15 mg/dL (ref 5–40)

## 2019-05-03 LAB — CBC
Hematocrit: 42.5 % (ref 37.5–51.0)
Hemoglobin: 14.3 g/dL (ref 13.0–17.7)
MCH: 28.1 pg (ref 26.6–33.0)
MCHC: 33.6 g/dL (ref 31.5–35.7)
MCV: 84 fL (ref 79–97)
Platelets: 325 10*3/uL (ref 150–450)
RBC: 5.08 x10E6/uL (ref 4.14–5.80)
RDW: 16.2 % — ABNORMAL HIGH (ref 11.6–15.4)
WBC: 5.7 10*3/uL (ref 3.4–10.8)

## 2019-05-03 LAB — HEPATIC FUNCTION PANEL
ALT: 21 IU/L (ref 0–44)
AST: 23 IU/L (ref 0–40)
Albumin: 4.6 g/dL (ref 4.0–5.0)
Alkaline Phosphatase: 78 IU/L (ref 39–117)
Bilirubin Total: 0.2 mg/dL (ref 0.0–1.2)
Bilirubin, Direct: 0.09 mg/dL (ref 0.00–0.40)
Total Protein: 6.9 g/dL (ref 6.0–8.5)

## 2019-05-03 LAB — MICROALBUMIN / CREATININE URINE RATIO
Creatinine, Urine: 205.8 mg/dL
Microalb/Creat Ratio: 13 mg/g creat (ref 0–29)
Microalbumin, Urine: 26.8 ug/mL

## 2019-05-04 ENCOUNTER — Other Ambulatory Visit: Payer: Self-pay | Admitting: Internal Medicine

## 2019-05-04 DIAGNOSIS — E785 Hyperlipidemia, unspecified: Secondary | ICD-10-CM | POA: Insufficient documentation

## 2019-05-04 DIAGNOSIS — E7800 Pure hypercholesterolemia, unspecified: Secondary | ICD-10-CM

## 2019-05-04 DIAGNOSIS — E1169 Type 2 diabetes mellitus with other specified complication: Secondary | ICD-10-CM | POA: Insufficient documentation

## 2019-05-04 MED ORDER — ATORVASTATIN CALCIUM 20 MG PO TABS: 20.0000 mg | ORAL_TABLET | Freq: Every day | ORAL | 3 refills | Status: DC

## 2019-05-05 MED FILL — ATORVASTATIN 20 MG TABLET: 20 | 30 days supply | Qty: 30 | Fill #0

## 2019-05-06 ENCOUNTER — Telehealth: Payer: Self-pay

## 2019-05-06 NOTE — Telephone Encounter (Signed)
[employer] interpreters Ricardo Ward  Id# [phone]  contacted pt to go over lab results pt didn't answer left a detailed vm informing pt of results and if he has any questions or concerns to give me a call

## 2019-05-12 ENCOUNTER — Other Ambulatory Visit: Payer: Self-pay

## 2019-05-12 ENCOUNTER — Ambulatory Visit: Payer: Self-pay | Attending: Internal Medicine

## 2019-05-27 ENCOUNTER — Telehealth: Payer: Self-pay | Admitting: Internal Medicine

## 2019-05-27 NOTE — Telephone Encounter (Signed)
t was sent a letter from financial dept. Inform them, that the application they submitted was incomplete, since they were missing some documentation at the time of the appointment, Pt need to reschedule and resubmit all new papers and application for [employer] and [employer], P.S. old documents has been sent back by mail to the Pt and Pt. need to make a new app

## 2019-06-23 ENCOUNTER — Other Ambulatory Visit: Payer: Self-pay

## 2019-06-23 ENCOUNTER — Ambulatory Visit: Payer: Self-pay | Attending: Family Medicine

## 2019-06-23 MED FILL — ?ATORVASTATIN 20 MG TABLET: 20 | 30 days supply | Qty: 30 | Fill #1

## 2019-06-23 MED FILL — ?METFORMIN HCL 500MG TABLET: 500 | 30 days supply | Qty: 60 | Fill #1

## 2019-06-23 MED FILL — FERROUS SULFATE 325 MG TAB: 325 (65 FE) | 30 days supply | Qty: 30 | Fill #1

## 2019-07-02 ENCOUNTER — Telehealth: Payer: Self-pay | Admitting: Internal Medicine

## 2019-07-02 NOTE — Telephone Encounter (Signed)
I received an email from [employer] that Patient has spouse Maren Reamer.  We would need income information for spouse to complete cafa process. Will hold account for 14 days for complete information for spouse., LVM to Pt to call me back

## 2019-07-28 ENCOUNTER — Ambulatory Visit: Payer: Self-pay | Attending: Internal Medicine | Admitting: Internal Medicine

## 2019-07-28 ENCOUNTER — Encounter: Payer: Self-pay | Admitting: Internal Medicine

## 2019-07-28 DIAGNOSIS — E1169 Type 2 diabetes mellitus with other specified complication: Secondary | ICD-10-CM

## 2019-07-28 DIAGNOSIS — K625 Hemorrhage of anus and rectum: Secondary | ICD-10-CM

## 2019-07-28 DIAGNOSIS — D509 Iron deficiency anemia, unspecified: Secondary | ICD-10-CM

## 2019-07-28 DIAGNOSIS — E785 Hyperlipidemia, unspecified: Secondary | ICD-10-CM

## 2019-07-28 DIAGNOSIS — E119 Type 2 diabetes mellitus without complications: Secondary | ICD-10-CM

## 2019-07-28 MED FILL — FERROUS SULFATE 325 MG TAB: 325 (65 FE) | 30 days supply | Qty: 30 | Fill #2

## 2019-07-28 MED FILL — ?ATORVASTATIN 20 MG TABLET: 20 | 30 days supply | Qty: 30 | Fill #2

## 2019-07-28 NOTE — Progress Notes (Signed)
Virtual Visit via Telephone Note Due to current restrictions/limitations of in-office visits due to the COVID-19 pandemic, this scheduled clinical appointment was converted to a telehealth visit  I connected with Ricardo Ward on 07/28/19 at 4:51 p.m by telephone and verified that I am speaking with the correct person using two identifiers. I am in my office.  The patient is at home.  Only the patient, myself and Elita Quick from [employer] (# [id number])participated in this encounter.  I discussed the limitations, risks, security and privacy concerns of performing an evaluation and management service by telephone and the availability of in person appointments. I also discussed with the patient that there may be a patient responsible charge related to this service. The patient expressed understanding and agreed to proceed.  History of Present Illness: IDA (iron def), DM, past hx COVID infection.  Last eval 04/2019. Today's visit is chronic ds management.  Hx of Iron Def Anemia/rectal bleeding: since last visit, he was approved for the [employer].  On last visit CBC revealed anemia had corrected.  Still taking iron supplement daily.  I had recommend changing to QOD.  Reports he still gets rectal bleeding just about every day.    DM: compliant with Metformin Last A1C was 5.8 in 04/2019.  Checks BS a few times a wk.  #s have been in the 90s.  He has not had eye exam done as yet.   Doing well with eating habits. LDL cholesterol 105.  I recommend starting Lipitor.  He did get the med   Observations/Objective: Results for orders placed or performed in visit on 05/02/19  Microalbumin / creatinine urine ratio  Result Value Ref Range   Creatinine, Urine 205.8 Not Estab. mg/dL   Microalbumin, Urine 26.8 Not Estab. ug/mL   Microalb/Creat Ratio 13 0 - 29 mg/g creat  Lipid panel  Result Value Ref Range   Cholesterol, Total 178 100 - 199 mg/dL   Triglycerides 84 0 - 149 mg/dL   HDL 58 >39 mg/dL   VLDL Cholesterol Cal 15 5 - 40 mg/dL   LDL Chol Calc (NIH) 105 (H) 0 - 99 mg/dL   Chol/HDL Ratio 3.1 0.0 - 5.0 ratio  Hepatic Function Panel  Result Value Ref Range   Total Protein 6.9 6.0 - 8.5 g/dL   Albumin 4.6 4.0 - 5.0 g/dL   Bilirubin Total 0.2 0.0 - 1.2 mg/dL   Bilirubin, Direct 0.09 0.00 - 0.40 mg/dL   Alkaline Phosphatase 78 39 - 117 IU/L   AST 23 0 - 40 IU/L   ALT 21 0 - 44 IU/L  CBC  Result Value Ref Range   WBC 5.7 3.4 - 10.8 x10E3/uL   RBC 5.08 4.14 - 5.80 x10E6/uL   Hemoglobin 14.3 13.0 - 17.7 g/dL   Hematocrit 42.5 37.5 - 51.0 %   MCV 84 79 - 97 fL   MCH 28.1 26.6 - 33.0 pg   MCHC 33.6 31.5 - 35.7 g/dL   RDW 16.2 (H) 11.6 - 15.4 %   Platelets 325 150 - 450 x10E3/uL  POCT glucose (manual entry)  Result Value Ref Range   POC Glucose 104 (A) 70 - 99 mg/dl  POCT glycosylated hemoglobin (Hb A1C)  Result Value Ref Range   Hemoglobin A1C     HbA1c POC (<> result, manual entry)     HbA1c, POC (prediabetic range)     HbA1c, POC (controlled diabetic range) 5.8 0.0 - 7.0 %     Assessment and Plan: 1. Iron  deficiency anemia, unspecified iron deficiency anemia type 2. Rectal bleeding Patient now has the orange card/cone discount.  I have submitted the referral for him to see the gastroenterologist.  Advised him to decrease the iron supplement to every other day. - Ambulatory referral to Gastroenterology  3. Controlled type 2 diabetes mellitus without complication, without long-term current use of insulin (HCC) Continue metformin, and healthy eating habits  4. Hyperlipidemia associated with type 2 diabetes mellitus (HCC) Continue Lipitor.   Follow Up Instructions: 4 mths   I discussed the assessment and treatment plan with the patient. The patient was provided an opportunity to ask questions and all were answered. The patient agreed with the plan and demonstrated an understanding of the instructions.   The patient was advised to call back or seek an in-person  evaluation if the symptoms worsen or if the condition fails to improve as anticipated.  I provided 17  minutes of non-face-to-face time during this encounter.   Karle Plumber, MD

## 2019-07-29 ENCOUNTER — Encounter: Payer: Self-pay | Admitting: Gastroenterology

## 2019-08-21 ENCOUNTER — Other Ambulatory Visit: Payer: Self-pay

## 2019-08-21 ENCOUNTER — Encounter: Payer: Self-pay | Admitting: Nurse Practitioner

## 2019-08-21 ENCOUNTER — Other Ambulatory Visit (HOSPITAL_COMMUNITY)
Admission: RE | Admit: 2019-08-21 | Discharge: 2019-08-21 | Disposition: A | Discharge status: Home / Self Care | Payer: Self-pay | Source: Ambulatory Visit | Attending: Nurse Practitioner | Admitting: Nurse Practitioner

## 2019-08-21 ENCOUNTER — Ambulatory Visit (INDEPENDENT_AMBULATORY_CARE_PROVIDER_SITE_OTHER): Payer: Self-pay | Admitting: Nurse Practitioner

## 2019-08-21 VITALS — BP 134/76 | HR 63 | Temp 96.8°F | Ht 67.0 in | Wt 199.4 lb

## 2019-08-21 DIAGNOSIS — D509 Iron deficiency anemia, unspecified: Secondary | ICD-10-CM

## 2019-08-21 DIAGNOSIS — K625 Hemorrhage of anus and rectum: Secondary | ICD-10-CM | POA: Insufficient documentation

## 2019-08-21 LAB — CBC WITH DIFFERENTIAL/PLATELET
Abs Immature Granulocytes: 0.01 10*3/uL (ref 0.00–0.07)
Basophils Absolute: 0.1 10*3/uL (ref 0.0–0.1)
Basophils Relative: 1 %
Eosinophils Absolute: 0.8 10*3/uL — ABNORMAL HIGH (ref 0.0–0.5)
Eosinophils Relative: 15 %
HCT: 41.5 % (ref 39.0–52.0)
Hemoglobin: 13.8 g/dL (ref 13.0–17.0)
Immature Granulocytes: 0 %
Lymphocytes Relative: 26 %
Lymphs Abs: 1.5 10*3/uL (ref 0.7–4.0)
MCH: 30.5 pg (ref 26.0–34.0)
MCHC: 33.3 g/dL (ref 30.0–36.0)
MCV: 91.8 fL (ref 80.0–100.0)
Monocytes Absolute: 0.4 10*3/uL (ref 0.1–1.0)
Monocytes Relative: 7 %
Neutro Abs: 2.9 10*3/uL (ref 1.7–7.7)
Neutrophils Relative %: 51 %
Platelets: 321 10*3/uL (ref 150–400)
RBC: 4.52 MIL/uL (ref 4.22–5.81)
RDW: 13 % (ref 11.5–15.5)
WBC: 5.7 10*3/uL (ref 4.0–10.5)
nRBC: 0 % (ref 0.0–0.2)

## 2019-08-21 LAB — IRON AND TIBC
Iron: 37 ug/dL — ABNORMAL LOW (ref 45–182)
Saturation Ratios: 9 % — ABNORMAL LOW (ref 17.9–39.5)
TIBC: 432 ug/dL (ref 250–450)
UIBC: 395 ug/dL

## 2019-08-21 LAB — FERRITIN: Ferritin: 23 ng/mL — ABNORMAL LOW (ref 24–336)

## 2019-08-21 MED ORDER — HYDROCORTISONE (PERIANAL) 2.5 % EX CREA: 1.0000 "application " | TOPICAL_CREAM | Freq: Two times a day (BID) | CUTANEOUS | 1 refills | Status: DC

## 2019-08-21 MED FILL — PROCTOZONE-HC 2.5 % CREA: 2.5 | 15 days supply | Qty: 30 | Fill #0

## 2019-08-21 NOTE — Assessment & Plan Note (Signed)
The patient has had iron deficiency anemia for which [facility] been treating him.  His hemoglobin got as low as 7.5.  This has recently improved with significant iron intake.  His ferritin was 13 in June which was the lowest has been.  His saturation was quite low at 3%.  He has been taking iron and his hemoglobin has improved recently.  However, no new/recent iron studies.  Additionally he describes worsening bleeding over the past 1 to 2 weeks.  He is quite concerned.  It does sound like he has classic internal hemorrhoids although we cannot rule out more insidious pathology.  At this point given his iron deficiency anemia we will plan for colonoscopy with possible upper endoscopy.  We will try to get this done as soon as possible due to the ongoing bleeding and quite low ferritin and iron saturation.  In the interim I will send in Anusol topical cream to see if we can help his symptoms.  He may be a candidate for internal hemorrhoid banding in the future.  Proceed with colonoscopy +/- EGD with Dr. Oneida Alar in the near future. The risks, benefits, and alternatives have been discussed in detail with the patient. They state understanding and desire to proceed.   The patient is not on any anticoagulants, anxiolytics, chronic pain medications, antidepressants.  He is on Metformin and iron supplements which we will adjust prior to his procedure.  Conscious sedation should likely be adequate for his procedure.

## 2019-08-21 NOTE — Progress Notes (Addendum)
REVIEWED-NO ADDITIONAL RECOMMENDATIONS.  Primary Care Physician:  Ladell Pier, MD Primary Gastroenterologist:  Dr. Oneida Alar  Chief Complaint  Patient presents with   Rectal Bleeding    worse this week, bright red; x 1 1/2 years; suppositries didn't help   Anemia    HPI:   Ricardo Ward is a 48 y.o. male who presents on referral from primary care for IDA and rectal bleeding.  Reviewed information related to the referral including primary care office visit dated 07/28/2019 for multiple issues including IDA and rectal bleeding.  This visit was a virtual office visit.  Noted history of iron deficiency anemia/rectal bleeding.  Last CBC revealed anemia had corrected, still taking iron.  Recommended changing iron to every other day.  He notes still gets rectal bleeding just about every day.  Recommended referral to GI.  Reviewed previous CBCs which 6 months ago showed a hemoglobin of 7.5.  This improved to 12.7 five months ago and then 14.3 three months ago.  Initially microcytic and hypochromic but this corrected (likely due to iron supplementation).  LFTs last checked 05/02/2019 were normal.  Previous ferritin 01/28/2019 was 13 which was a decline from 16, 21 on previous lab draws.  Last iron completed 01/24/2019 which showed low iron at 11, low saturation 3%.  Today he is accompanied by a Publishing copy. He was significantly ill in M<ay when he was COVID+ and required hospital admission and high flow oxygen (but no intubation). Had signifcant dyspnea, fever, chills, diarrhea. He finally recovered after 2 months. Today he states he has had rectal bleeding for the past 1.5 years. Recently (over the past week) he has been more active and the bleeding has gotten worse. He's been trying suppositories and warm sitz baths without much improvement. See hematochezia with every bowel movement. Blood on the stool and in commode. If he "holds it and doesn't go to the bathroom for a whole day, will have  black stools." Has been having left-sided/LLQ abdominal pain described as quick cramp/sharp which relieves rapidly on it's own. Denies GERD symptoms. Takes NSAIDs intermittently/not very often. Denies N/V, fever, chills, unintentional weight loss. Denies weakness, fatigue. He has ahd some more lightheadedness/dizziness. Denies URI or flu-like symptoms. Denies loss of sense of taste or smell. Denies chest pain, dyspnea, syncope, near syncope. Denies any other upper or lower GI symptoms.  He is on oral iron. At the end of the visit he described symptoms that seem like classic internal hemorrhoid(s) with occasional protrusion, rectal pain/irrortation. Has tried rectal suppository without relief.  Past Medical History:  Diagnosis Date   COVID-19 12/2018   s/p hospitalization and recovery   Diabetes ([place])    Pterygium    rt eye   Pterygium of both eyes    removal    Past Surgical History:  Procedure Laterality Date   BACK SURGERY     EYE SURGERY Bilateral 06   pterygium    PTERYGIUM EXCISION Bilateral    PTERYGIUM EXCISION Right 11/12/2013   Procedure: PTERYGIUM EXCISION WITH MITOMYCIN C AND AMNIOTIC MEMBRANE USING TISSEEL GLUE RIGHT EYE;  Surgeon: Marylynn Pearson, MD;  Location: [facility];  Service: Ophthalmology;  Laterality: Right;    Current Outpatient Medications  Medication Sig Dispense Refill   atorvastatin (LIPITOR) 20 MG tablet Take 1 tablet (20 mg total) by mouth daily. 90 tablet 3   blood glucose meter kit and supplies Dispense based on patient and insurance preference. Use up to four times daily as directed. (FOR ICD-10  E10.9, E11.9). 1 each 0   ferrous sulfate 325 (65 FE) MG tablet Take 1 tablet (325 mg total) by mouth daily with breakfast. 100 tablet 1   metFORMIN (GLUCOPHAGE) 500 MG tablet Take 1 tablet (500 mg total) by mouth 2 (two) times daily with a meal. 60 tablet 4   No current facility-administered medications for this visit.    Allergies as of 08/21/2019 -  Review Complete 08/21/2019  Allergen Reaction Noted   Shellfish allergy Rash 08/21/2019    Family History  Problem Relation Age of Onset   Alzheimer's disease Mother    Emphysema Father    Colon cancer Neg Hx     Social History   Socioeconomic History   Marital status: Divorced    Spouse name: Not on file   Number of children: Not on file   Years of education: Not on file   Highest education level: Not on file  Occupational History   Not on file  Tobacco Use   Smoking status: Former Smoker    Packs/day: 1.00    Years: 13.00    Pack years: 13.00    Types: Cigarettes    Quit date: 12/10/1998    Years since quitting: 20.7   Smokeless tobacco: Never Used  Substance and Sexual Activity   Alcohol use: No   Drug use: No   Sexual activity: Not on file  Other Topics Concern   Not on file  Social History Narrative   Needs interpreter for Spanish   Social Determinants of Health   Financial Resource Strain:    Difficulty of Paying Living Expenses: Not on file  Food Insecurity:    Worried About Charity fundraiser in the Last Year: Not on file   [employer] of Food in the Last Year: Not on file  Transportation Needs:    Lack of Transportation (Medical): Not on file   Lack of Transportation (Non-Medical): Not on file  Physical Activity:    Days of Exercise per Week: Not on file   Minutes of Exercise per Session: Not on file  Stress:    Feeling of Stress : Not on file  Social Connections:    Frequency of Communication with Friends and Family: Not on file   Frequency of Social Gatherings with Friends and Family: Not on file   Attends Religious Services: Not on file   Active Member of Clubs or Organizations: Not on file   Attends Archivist Meetings: Not on file   Marital Status: Not on file  Intimate Partner Violence:    Fear of Current or Ex-Partner: Not on file   Emotionally Abused: Not on file   Physically Abused: Not on file     Sexually Abused: Not on file    Review of Systems: General: Negative for anorexia, weight loss, fever, chills, fatigue, weakness. ENT: Negative for hoarseness, difficulty swallowing. CV: Negative for chest pain, angina, palpitations, peripheral edema.  Respiratory: Negative for dyspnea at rest, cough, sputum, wheezing.  GI: See history of present illness.  MS: Negative for joint pain, low back pain.  Derm: Negative for rash or itching.  Endo: Negative for unusual weight change.  Heme: Negative for bruising or bleeding. Allergy: Negative for rash or hives.    Physical Exam: BP 134/76    Pulse 63    Temp (!) 96.8 F (36 C) (Temporal)    Ht 5' 7"  (1.702 m)    Wt 199 lb 6.4 oz (90.4 kg)    BMI  31.23 kg/m  General:   Alert and oriented. Pleasant and cooperative. Well-nourished and well-developed.  Eyes:  Without icterus, sclera clear and conjunctiva pink.  Ears:  Normal auditory acuity. Cardiovascular:  S1, S2 present without murmurs appreciated. Extremities without clubbing or edema. Respiratory:  Clear to auscultation bilaterally. No wheezes, rales, or rhonchi. No distress.  Gastrointestinal:  +BS, soft, non-tender and non-distended. No HSM noted. No guarding or rebound. No masses appreciated.  Rectal:  Deferred  Musculoskalatal:  Symmetrical without gross deformities. Neurologic:  Alert and oriented x4;  grossly normal neurologically. Psych:  Alert and cooperative. Normal mood and affect. Heme/Lymph/Immune: No excessive bruising noted.    08/21/2019 10:59 AM   Disclaimer: This note was dictated with voice recognition software. Similar sounding words can inadvertently be transcribed and may not be corrected upon review.

## 2019-08-21 NOTE — Assessment & Plan Note (Signed)
The patient describes rectal bleeding for the past year and a half.  He has been quite anemic because of it, as per below.  He continues to have rectal bleeding in the past 1 to 2 weeks he has had worsening bleeding.  This occurs with every bowel movement and sometimes in between.  He oftentimes has to sit on the toilet and wait for the blood to stop drinking before he can clean himself and move along with his day.  Given his worsening bleeding I will recheck CBC, iron, ferritin.  Endoscopic evaluation as per below.  Anusol as per below.  Follow-up in 4 months.

## 2019-08-21 NOTE — Patient Instructions (Addendum)
English:  Your health problems we discussed today were:   Rectal bleeding with anemia and low iron: 1. As we discussed, I feel you at least have hemorrhoids. 2. I have sent a prescription for Anusol rectal cream to your pharmacy.  You can apply this up to twice a day for up to 10 days at a time for rectal bleeding and hemorrhoid symptoms including pain, itching, irritation 3. We will schedule your colonoscopy and endoscopy for you 4. Have your labs completed when you are able to 5. Further recommendations will follow your procedures 6. Call us if your bleeding becomes worse or severe  Overall I recommend:  1. Continue your other medications 2. Follow-up in our office in 4 months 3. Call us if you have any questions or problems   Spanish:   Sus problemas de salud que discutimos hoy fueron:  Sangrado rectal con anemia y bajo nivel de hierro: 1. Como comentamos, siento que al menos tienes hemorroides. 2. He enviado una receta para la crema rectal Anusol a su farmacia. Puede aplicar esto [employer] veces al da durante un mximo de 10 das a la vez para sangrado rectal y sntomas de hemorroides que incluyen dolor, picazn e irritacin. 3. Programaremos su colonoscopia y endoscopia por usted 4. Complete sus laboratorios cuando pueda 5. Ms recomendaciones seguirn sus procedimientos 6. Llmenos si su sangrado empeora o se agrava  En general recomiendo: 1. Contine con sus otros medicamentos 2. Seguimiento en nuestra oficina en 4 meses 3. Llmanos si tienes alguna duda o problema   Because of recent events of COVID-19 ("Coronavirus"), follow CDC recommendations:  1. Wash your hand frequently 2. Avoid touching your face 3. Stay away from people who are sick 4. If you have symptoms such as fever, cough, shortness of breath then call your healthcare provider for further guidance 5. If you are sick, STAY AT HOME unless otherwise directed by your healthcare provider. 6. Follow  directions from state and national officials regarding staying safe   At [facility] we value your feedback. You may receive a survey about your visit today. Please share your experience as we strive to create trusting relationships with our patients to provide genuine, compassionate, quality care.  We appreciate your understanding and patience as we review any laboratory studies, imaging, and other diagnostic tests that are ordered as we care for you. Our office policy is 5 business days for review of these results, and any emergent or urgent results are addressed in a timely manner for your best interest. If you do not hear from our office in 1 week, please contact us.   We also encourage the use of MyChart, which contains your medical information for your review as well. If you are not enrolled in this feature, an access code is on this after visit summary for your convenience. Thank you for allowing [place] to be involved in your care.  Fue genial verte hoy! Espero que tengas un [place] !!

## 2019-08-26 ENCOUNTER — Telehealth: Payer: Self-pay | Admitting: *Deleted

## 2019-08-26 ENCOUNTER — Encounter: Payer: Self-pay | Admitting: *Deleted

## 2019-08-26 ENCOUNTER — Other Ambulatory Visit: Payer: Self-pay | Admitting: *Deleted

## 2019-08-26 DIAGNOSIS — D509 Iron deficiency anemia, unspecified: Secondary | ICD-10-CM

## 2019-08-26 DIAGNOSIS — K625 Hemorrhage of anus and rectum: Secondary | ICD-10-CM

## 2019-08-26 NOTE — Telephone Encounter (Signed)
Spouse Mabel called back. She is aware procedure scheduled for 1/13 at 3:00pm, arrival [facility] 2:00pm. She will come by office tomorrow to pick up sample prep with instructions with covid testing appt.  Aware he needs to start holding iron 08/27/2019.

## 2019-08-26 NOTE — Telephone Encounter (Signed)
LMOVM for pt to schedule TCS +/-EGD WITH [facility] Needs to hold iron 1 week prior to TCS, no metformin morning of procedure

## 2019-08-26 NOTE — Telephone Encounter (Signed)
Patient procedure time has been changed to 2:00pm. Arrival 1:00pm. New instructions done and placed for pick up. Endo aware.

## 2019-08-26 NOTE — Telephone Encounter (Signed)
Called spouse # on dpr and lmovm

## 2019-08-28 ENCOUNTER — Telehealth: Payer: Self-pay | Admitting: Gastroenterology

## 2019-08-28 NOTE — Telephone Encounter (Signed)
Called hospital. It was a mix up and now has been fixed. Patient is on for 1/13. Spouse aware.

## 2019-08-28 NOTE — Telephone Encounter (Signed)
[phone]  Please call patient, they are upset that they got a call that the procedure dates have changed

## 2019-09-02 ENCOUNTER — Other Ambulatory Visit (HOSPITAL_COMMUNITY)
Admission: RE | Admit: 2019-09-02 | Discharge: 2019-09-02 | Disposition: A | Discharge status: Home / Self Care | Payer: Self-pay | Source: Ambulatory Visit | Attending: Gastroenterology | Admitting: Gastroenterology

## 2019-09-02 ENCOUNTER — Other Ambulatory Visit: Payer: Self-pay

## 2019-09-02 DIAGNOSIS — Z20822 Contact with and (suspected) exposure to covid-19: Secondary | ICD-10-CM | POA: Insufficient documentation

## 2019-09-02 DIAGNOSIS — Z01812 Encounter for preprocedural laboratory examination: Secondary | ICD-10-CM | POA: Insufficient documentation

## 2019-09-02 LAB — SARS CORONAVIRUS 2 (TAT 6-24 HRS): SARS Coronavirus 2: NEGATIVE

## 2019-09-02 MED FILL — ?METFORMIN HCL 500MG TABLET: 500 | 30 days supply | Qty: 60 | Fill #2

## 2019-09-02 MED FILL — HYDROCORTISONE ACETATE 25 M: 25 | 6 days supply | Qty: 12 | Fill #1

## 2019-09-02 MED FILL — PROCTOZONE-HC 2.5 % CREA: 2.5 | 15 days supply | Qty: 30 | Fill #1

## 2019-09-02 MED FILL — ?ATORVASTATIN 20 MG TABLET: 20 | 30 days supply | Qty: 30 | Fill #3

## 2019-09-03 ENCOUNTER — Telehealth: Payer: Self-pay | Admitting: Gastroenterology

## 2019-09-03 ENCOUNTER — Encounter (HOSPITAL_COMMUNITY)
Admission: RE | Disposition: A | Discharge status: Home / Self Care | Payer: Self-pay | Source: Home / Self Care | Attending: Gastroenterology

## 2019-09-03 ENCOUNTER — Other Ambulatory Visit: Payer: Self-pay

## 2019-09-03 ENCOUNTER — Encounter (HOSPITAL_COMMUNITY): Payer: Self-pay | Admitting: Gastroenterology

## 2019-09-03 ENCOUNTER — Ambulatory Visit (HOSPITAL_COMMUNITY)
Admission: RE | Admit: 2019-09-03 | Discharge: 2019-09-03 | Disposition: A | Discharge status: Home / Self Care | Payer: Self-pay | Attending: Gastroenterology | Admitting: Gastroenterology

## 2019-09-03 DIAGNOSIS — D123 Benign neoplasm of transverse colon: Secondary | ICD-10-CM | POA: Insufficient documentation

## 2019-09-03 DIAGNOSIS — D509 Iron deficiency anemia, unspecified: Secondary | ICD-10-CM

## 2019-09-03 DIAGNOSIS — K635 Polyp of colon: Secondary | ICD-10-CM

## 2019-09-03 DIAGNOSIS — K2100 Gastro-esophageal reflux disease with esophagitis, without bleeding: Secondary | ICD-10-CM

## 2019-09-03 DIAGNOSIS — K5730 Diverticulosis of large intestine without perforation or abscess without bleeding: Secondary | ICD-10-CM | POA: Insufficient documentation

## 2019-09-03 DIAGNOSIS — K625 Hemorrhage of anus and rectum: Secondary | ICD-10-CM

## 2019-09-03 DIAGNOSIS — Z7984 Long term (current) use of oral hypoglycemic drugs: Secondary | ICD-10-CM | POA: Insufficient documentation

## 2019-09-03 DIAGNOSIS — K2950 Unspecified chronic gastritis without bleeding: Secondary | ICD-10-CM | POA: Insufficient documentation

## 2019-09-03 DIAGNOSIS — K222 Esophageal obstruction: Secondary | ICD-10-CM

## 2019-09-03 DIAGNOSIS — K649 Unspecified hemorrhoids: Secondary | ICD-10-CM

## 2019-09-03 DIAGNOSIS — Z79899 Other long term (current) drug therapy: Secondary | ICD-10-CM | POA: Insufficient documentation

## 2019-09-03 DIAGNOSIS — Z87891 Personal history of nicotine dependence: Secondary | ICD-10-CM | POA: Insufficient documentation

## 2019-09-03 DIAGNOSIS — E119 Type 2 diabetes mellitus without complications: Secondary | ICD-10-CM | POA: Insufficient documentation

## 2019-09-03 DIAGNOSIS — K921 Melena: Secondary | ICD-10-CM | POA: Insufficient documentation

## 2019-09-03 DIAGNOSIS — K2211 Ulcer of esophagus with bleeding: Secondary | ICD-10-CM | POA: Insufficient documentation

## 2019-09-03 DIAGNOSIS — Z8616 Personal history of COVID-19: Secondary | ICD-10-CM | POA: Insufficient documentation

## 2019-09-03 DIAGNOSIS — K648 Other hemorrhoids: Secondary | ICD-10-CM | POA: Insufficient documentation

## 2019-09-03 DIAGNOSIS — Z91013 Allergy to seafood: Secondary | ICD-10-CM | POA: Insufficient documentation

## 2019-09-03 HISTORY — PX: POLYPECTOMY: SHX5525

## 2019-09-03 HISTORY — PX: BIOPSY: SHX5522

## 2019-09-03 HISTORY — PX: COLONOSCOPY: SHX5424

## 2019-09-03 HISTORY — PX: ESOPHAGOGASTRODUODENOSCOPY: SHX5428

## 2019-09-03 LAB — GLUCOSE, CAPILLARY: Glucose-Capillary: 85 mg/dL (ref 70–99)

## 2019-09-03 SURGERY — COLONOSCOPY
Anesthesia: Moderate Sedation

## 2019-09-03 MED ORDER — MIDAZOLAM HCL 5 MG/5ML IJ SOLN
INTRAMUSCULAR | Status: DC | PRN
  Administered 2019-09-03 (×2): 2 mg via INTRAVENOUS
  Administered 2019-09-03: 1 mg via INTRAVENOUS
  Administered 2019-09-03: 2 mg via INTRAVENOUS

## 2019-09-03 MED ORDER — PANTOPRAZOLE SODIUM 40 MG PO TBEC: DELAYED_RELEASE_TABLET | ORAL | 11 refills | Status: DC

## 2019-09-03 MED ORDER — STERILE WATER FOR IRRIGATION IR SOLN
Status: DC | PRN
  Administered 2019-09-03: 14:00:00 1.5 mL

## 2019-09-03 MED ORDER — LIDOCAINE VISCOUS HCL 2 % MT SOLN
OROMUCOSAL | Status: DC | PRN
  Administered 2019-09-03: 1 via OROMUCOSAL

## 2019-09-03 MED ORDER — MEPERIDINE HCL 100 MG/ML IJ SOLN
INTRAMUSCULAR | Status: AC
  Filled 2019-09-03: qty 2

## 2019-09-03 MED ORDER — LIDOCAINE VISCOUS HCL 2 % MT SOLN
OROMUCOSAL | Status: AC
  Filled 2019-09-03: qty 15

## 2019-09-03 MED ORDER — MIDAZOLAM HCL 5 MG/5ML IJ SOLN
INTRAMUSCULAR | Status: AC
  Filled 2019-09-03: qty 10

## 2019-09-03 MED ORDER — MINERAL OIL PO OIL
TOPICAL_OIL | ORAL | Status: AC
  Filled 2019-09-03: qty 30

## 2019-09-03 MED ORDER — SODIUM CHLORIDE 0.9 % IV SOLN
INTRAVENOUS | Status: DC
  Administered 2019-09-03: 13:00:00 1000 mL via INTRAVENOUS

## 2019-09-03 MED ORDER — MEPERIDINE HCL 100 MG/ML IJ SOLN
INTRAMUSCULAR | Status: DC | PRN
  Administered 2019-09-03 (×3): 25 mg via INTRAVENOUS

## 2019-09-03 MED FILL — PANTOPRAZOLE SOD DR 40 MG T: 40 | 30 days supply | Qty: 60 | Fill #0

## 2019-09-03 NOTE — H&P (Signed)
Primary Care Physician:  Ladell Pier, MD Primary Gastroenterologist:  Dr. Oneida Alar  Pre-Procedure History & Physical: HPI:  Ricardo Ward is a 49 y.o. male here for  Anemia/BRBPR. SPANISH SPEAKING ONLY-HISTORY OBTAINED VIA INTERPRETER: [id number]     .  Past Medical History:  Diagnosis Date  . COVID-19 12/2018   s/p hospitalization and recovery  . Diabetes ([place])   . Pterygium    rt eye  . Pterygium of both eyes    removal    Past Surgical History:  Procedure Laterality Date  . APPENDECTOMY    . BACK SURGERY    . EYE SURGERY Bilateral 06   pterygium   . PTERYGIUM EXCISION Bilateral   . PTERYGIUM EXCISION Right 11/12/2013   Procedure: PTERYGIUM EXCISION WITH MITOMYCIN C AND AMNIOTIC MEMBRANE USING TISSEEL GLUE RIGHT EYE;  Surgeon: Marylynn Pearson, MD;  Location: [place];  Service: Ophthalmology;  Laterality: Right;    Prior to Admission medications   Medication Sig Start Date End Date Taking? Authorizing Provider  atorvastatin (LIPITOR) 20 MG tablet Take 1 tablet (20 mg total) by mouth daily. 05/04/19  Yes Ladell Pier, MD  hydrocortisone (ANUSOL-HC) 2.5 % rectal cream Place 1 application rectally 2 (two) times daily. 08/21/19  Yes Carlis Stable, NP  blood glucose meter kit and supplies Dispense based on patient and insurance preference. Use up to four times daily as directed. (FOR ICD-10 E10.9, E11.9). 01/29/19   Ghimire, Henreitta Leber, MD  ferrous sulfate 325 (65 FE) MG tablet Take 1 tablet (325 mg total) by mouth daily with breakfast. 05/02/19   Ladell Pier, MD  metFORMIN (GLUCOPHAGE) 500 MG tablet Take 1 tablet (500 mg total) by mouth 2 (two) times daily with a meal. Patient taking differently: Take 500 mg by mouth daily.  05/02/19   Ladell Pier, MD  Multiple Vitamin (MULTIVITAMIN WITH MINERALS) TABS tablet Take 1 tablet by mouth daily.    [provider]    Allergies as of 08/26/2019 - Review Complete 08/21/2019  Allergen Reaction Noted  . Shellfish  allergy Rash 08/21/2019    Family History  Problem Relation Age of Onset  . Alzheimer's disease Mother   . Emphysema Father   . Colon cancer Neg Hx     Social History   Socioeconomic History  . Marital status: Divorced    Spouse name: Not on file  . Number of children: Not on file  . Years of education: Not on file  . Highest education level: Not on file  Occupational History  . Not on file  Tobacco Use  . Smoking status: Former Smoker    Packs/day: 1.00    Years: 13.00    Pack years: 13.00    Types: Cigarettes    Quit date: 12/10/1998    Years since quitting: 20.7  . Smokeless tobacco: Never Used  Substance and Sexual Activity  . Alcohol use: No  . Drug use: No  . Sexual activity: Not on file  Other Topics Concern  . Not on file  Social History Narrative   Needs interpreter for Spanish   Social Determinants of Health   Financial Resource Strain:   . Difficulty of Paying Living Expenses: Not on file  Food Insecurity:   . Worried About Charity fundraiser in the Last Year: Not on file  . Ran Out of Food in the Last Year: Not on file  Transportation Needs:   . Lack of Transportation (Medical): Not on file  .  Lack of Transportation (Non-Medical): Not on file  Physical Activity:   . Days of Exercise per Week: Not on file  . Minutes of Exercise per Session: Not on file  Stress:   . Feeling of Stress : Not on file  Social Connections:   . Frequency of Communication with Friends and Family: Not on file  . Frequency of Social Gatherings with Friends and Family: Not on file  . Attends Religious Services: Not on file  . Active Member of Clubs or Organizations: Not on file  . Attends Archivist Meetings: Not on file  . Marital Status: Not on file  Intimate Partner Violence:   . Fear of Current or Ex-Partner: Not on file  . Emotionally Abused: Not on file  . Physically Abused: Not on file  . Sexually Abused: Not on file    Review of Systems: See HPI,  otherwise negative ROS   Physical Exam: BP (!) 148/90   Pulse 68   Temp (!) 97.3 F (36.3 C) (Axillary)   Resp 15   Ht 5' 8"  (1.727 m)   Wt 90.3 kg   SpO2 100%   BMI 30.26 kg/m  General:   Alert,  pleasant and cooperative in NAD Head:  Normocephalic and atraumatic. Neck:  Supple; Lungs:  Clear throughout to auscultation.    Heart:  Regular rate and rhythm. Abdomen:  Soft, nontender and nondistended. Normal bowel sounds, without guarding, and without rebound.   Neurologic:  Alert and  oriented x4;  grossly normal neurologically.  Impression/Plan:     Anemia/BRBPR  PLAN:  1. TCS/EGD TODAY. DISCUSSED PROCEDURE, BENEFITS, & RISKS: < 1% chance of medication reaction, bleeding, perforation, ASPIRATION, or rupture of spleen/liver requiring surgery to fix it and missed polyps < 1 cm 10-20% of the time.

## 2019-09-03 NOTE — Telephone Encounter (Signed)
SEE SURGERY TO FIX YOUR HEMORRHOIDS(JENKINS/BRIDGES)

## 2019-09-03 NOTE — Addendum Note (Signed)
Addended by: Cheron Every on: 09/03/2019 03:14 PM   Modules accepted: Orders

## 2019-09-03 NOTE — Op Note (Signed)
[facility] Patient Name: Ricardo Ward Procedure Date: 09/03/2019 2:07 PM MRN: [id number] Date of Birth: 1971-01-17 Attending MD: Barney Drain MD, MD CSN: [id number] Age: 49 Admit Type: Outpatient Procedure:                Upper GI endoscopy WITH COLD FORCEPS BIOPSY Indications:              Iron deficiency anemia Providers:                Barney Drain MD, MD, Charlsie Quest. Theda Sers RN, RN,                            Randa Spike, Technician Referring MD:             Ladell Pier Medicines:                TCS + Midazolam 2 mg IV Complications:            No immediate complications. Estimated Blood Loss:     Estimated blood loss was minimal. Procedure:                Pre-Anesthesia Assessment:                           - Prior to the procedure, a History and Physical                            was performed, and patient medications and                            allergies were reviewed. The patient's tolerance of                            previous anesthesia was also reviewed. The risks                            and benefits of the procedure and the sedation                            options and risks were discussed with the patient.                            All questions were answered, and informed consent                            was obtained. Prior Anticoagulants: The patient has                            taken no previous anticoagulant or antiplatelet                            agents. ASA Grade Assessment: II - A patient with                            mild systemic disease. After reviewing the risks  and benefits, the patient was deemed in                            satisfactory condition to undergo the procedure.                            After obtaining informed consent, the endoscope was                            passed under direct vision. Throughout the                            procedure, the patient's blood pressure, pulse, and                             oxygen saturations were monitored continuously. The                            [id number]-[id number]) scope was introduced through the                            mouth, and advanced to the second part of duodenum.                            The upper GI endoscopy was accomplished without                            difficulty. The patient tolerated the procedure                            well. Scope In: 2:11:24 PM Scope Out: 2:17:27 PM Total Procedure Duration: 0 hours 6 minutes 3 seconds  Findings:      LA Grade B (one or more mucosal breaks greater than 5 mm, not extending       between the tops of two mucosal folds) esophagitis with no bleeding was       found.      A low-grade of narrowing Schatzki ring was found at the gastroesophageal       junction.      Localized mild inflammation characterized by congestion (edema) and       erythema was found on the greater curvature of the stomach and in the       gastric antrum. Biopsies were taken with a cold forceps for Helicobacter       pylori testing.      The examined duodenum was normal. Impression:               - NORMOCYTIC ANEMIA DUE TO EROSIVE esophagitis,                            GASTRITIS, AND HEMORRHOIDAL BLEEDING.                           - Low-grade of narrowing Schatzki ring DUE TO  UNCONTROLLED GERD. Moderate Sedation:      Moderate (conscious) sedation was administered by the endoscopy nurse       and supervised by the endoscopist. The following parameters were       monitored: oxygen saturation, heart rate, blood pressure, and response       to care. Total physician intraservice time was 55 minutes. Recommendation:           - Patient has a contact number available for                            emergencies. The signs and symptoms of potential                            delayed complications were discussed with the                            patient. Return to normal  activities tomorrow.                            Written discharge instructions were provided to the                            patient.                           - High fiber diet and low fat diet.                           - Continue present medications. PROTONIX BID FOR 3                            MOS THEN ONCE DAILY. CONTINUE IRON DAILY FOR 3 MOS                            THEN STOP.                           - Await pathology results.                           - Return to GI office in 4 months. CBC/FERRRITIN 1                            WEEK PRIOR TO NEXT VISIT. Procedure Code(s):        --- Professional ---                           [phone], Esophagogastroduodenoscopy, flexible,                            transoral; with biopsy, single or multiple                           99153, Moderate sedation; each additional 15  minutes intraservice time                           99153, Moderate sedation; each additional 15                            minutes intraservice time                           99153, Moderate sedation; each additional 15                            minutes intraservice time                           G0500, Moderate sedation services provided by the                            same physician or other qualified health care                            professional performing a gastrointestinal                            endoscopic service that sedation supports,                            requiring the presence of an independent trained                            observer to assist in the monitoring of the                            patient's level of consciousness and physiological                            status; initial 15 minutes of intra-service time;                            patient age 67 years or older (additional time may                            be reported with [phone], as appropriate) Diagnosis Code(s):        --- Professional ---                            K21.00, Gastro-esophageal reflux disease with                            esophagitis, without bleeding                           K22.2, Esophageal obstruction                           K29.70, Gastritis, unspecified, without bleeding  D50.9, Iron deficiency anemia, unspecified CPT copyright 2019 American Medical Association. All rights reserved. The codes documented in this report are preliminary and upon coder review may  be revised to meet current compliance requirements. Barney Drain, MD Barney Drain MD, MD 09/03/2019 2:42:39 PM This report has been signed electronically. Number of Addenda: 0

## 2019-09-03 NOTE — Telephone Encounter (Signed)
Referral done. Note from [facility] states request ASAP APPT

## 2019-09-03 NOTE — Discharge Instructions (Signed)
Tiene sangrado rectal debido a hemorroides internas. LO MS PROBABLE ES QUE LA FUENTE DE TU HIERRO BAJO SE DEBA A HEMORROIDES, UN ESFAGO INFLAMADO Y GASTRITIS. TIENES ESTRECHAMIENTO (ESTRICCIN) EN LA PARTE INFERIOR DEL ESFAGO Y UNA PEQUEA HERNIA DE [place]. TE HE PRESENTADO UNA BIOPSIA DEL ESTMAGO.   BEBA AGUA PARA MANTENER LA [place].  SIGUE UNA DIETA BAJA EN GRASAS/ALTA EN FIBRA. LAS CARNES DEBEN SER HORNEADAS, ASADAS O HERVIDAS. EVITAR LA COMIDA RPIDA. [place]. [place] INFORMACIN.   AADIR PROTONIX. TOMAR 30 MINUTOS ANTES DE LAS COMIDAS DOS VECES AL DA DURANTE 3 MESES Y [place] UNA VEZ AL DA PARA [place].  CONTINE EL HIERRO DURANTE 3 MOS. DETNGASE ALREDEDOR DEL 15 DE ABRIL.  USE LA PREPARACIN H CUATRO VECES AL DA SI ES NECESARIO PARA ALIVIAR EL DOLOR RECTAL/PRESIN/SANGRADO.   VER CIRUGA PARA ARREGLO LAS HEMORROIDES.  LOS RESULTADOS DE LA BIOPSIA VOLVERN EN 5 DAS HBILES.  SEGUIMIENTO EN 4 MOS.     You have rectal bleeding due to internal hemorrhoids. THE SOURCE FOR YOUR LOW IRON IS MOST LIKELY DUE TO HEMORRHOIDS, AN INFLAMED ESOPHAGUS, AND GASTRITIS. YOU HAVE NARROWING(STRICTURE) AT THE BOTTOM OF YOUR ESOPHAGUS AND A SMALL HIATAL HERNIA. I BIOPSIED YOUR STOMACH.   DRINK WATER TO KEEP YOUR URINE LIGHT YELLOW.  FOLLOW A LOW FAT/HIGH FIBER DIET. MEATS SHOULD BE BAKED, BROILED, OR BOILED. AVOID FAST FOOD. AVOID FRIED FOOD. SEE INFO BELOW.   ADD PROTONIX. TAKE 30 MINUTES PRIOR TO MEALS TWICE DAILY FOR 3 MOS THEN ONCE DAILY FOREVER.  CONTINUE IRON FOR 3 MOS. STOP AROUND APR 15.  USE PREPARATION H FOUR TIMES  A DAY IF NEEDED TO RELIEVE RECTAL PAIN/PRESSURE/BLEEDING.   SEE SURGERY TO FIX YOUR HEMORRHOIDS.  YOUR BIOPSY RESULTS WILL BE BACK IN 5 BUSINESS DAYS.  FOLLOW UP IN 4 MOS.       Colonoscopa: cuidados posteriores (Colonoscopy, Care After) Siga estas instrucciones durante las prximas semanas. Estas  indicaciones le proporcionan informacin general acerca de cmo deber cuidarse despus del procedimiento. El mdico tambin podr darle instrucciones ms especficas. El tratamiento ha sido planificado segn las prcticas mdicas actuales, pero en algunos casos pueden ocurrir problemas. Comunquese con el mdico si tiene algn problema o tiene dudas despus del procedimiento. QU ESPERAR DESPUS DEL PROCEDIMIENTO  Despus del procedimiento, es comn tener las siguientes sensaciones:  Una pequea cantidad de sangre en la materia fecal.  Cantidades moderadas de gases e hinchazn o calambres abdominales leves. INSTRUCCIONES PARA EL CUIDADO EN EL HOGAR  No conduzca vehculos, opere maquinarias ni firme documentos importantes durante 24horas.  Puede ducharse y retomar sus actividades fsicas habituales, pero muvase a un ritmo ms lento durante las primeras 24horas.  Tmese descansos frecuentes durante las primeras 24horas.  Camine o colquese compresas calientes en el abdomen para ayudar a reducir los calambres e hinchazn abdominales.  Beba suficiente lquido para Consulting civil engineer orina clara o de color amarillo plido.  Puede retomar su dieta normal segn las instrucciones de su mdico. Evite los alimentos pesados o fritos que son difciles de Publishing copy.  Evite consumir alcohol durante 24horas o segn las instrucciones de su mdico.  Tome solo medicamentos de venta libre o recetados, segn las indicaciones del mdico.  Si se obtuvo una muestra de tejido (biopsia) durante el procedimiento:  No tome aspirina ni anticoagulantes durante 7das, o segn las instrucciones de su mdico.  No consuma alcohol durante 7das o segn las instrucciones de su mdico.  Consuma alimentos  livianos durante las primeras 24horas. SOLICITE ATENCIN MDICA SI: Tiene manchas persistentes de sangre en la materia fecal entre 2 y 3das posteriores al procedimiento. SOLICITE ATENCIN MDICA DE INMEDIATO  SI:  Tiene ms que una pequea mancha de sangre en la materia fecal.  Elimina grandes cogulos de sangre en la materia fecal.  Tiene el abdomen hinchado (distendido).  Tiene nuseas o vmitos.  Tiene fiebre.  Siente dolor intenso en el abdomen que no se alivia con los [employer].   Dieta rica en fibra  (High-Fiber Diet)  La fibra, tambin llamada fibra dietaria, es un tipo de carbohidrato que se encuentra en las frutas, las verduras, los cereales integrales y los frijoles. Una dieta rica en fibra puede tener muchos beneficios para la salud. El mdico puede recomendar una dieta rica en fibra para ayudar a:  Contractor. La fibra puede hacer que defeque con ms frecuencia.  Disminuir el nivel de colesterol.  Andale hemorroides, la diverticulosis no complicada o el sndrome del intestino irritable.  Evitar comer en exceso como parte de un plan para bajar de peso.  Evitar cardiopatas, la diabetes tipo 2 y ciertos cnceres. EN QU CONSISTE EL PLAN?  El consumo diario recomendado de fibra incluye lo siguiente:  38 gramos para hombres menores de 40 aos.  30 gramos para hombres mayores de 50 aos.  25 gramos para mujeres menores de 50 aos.  21 gramos para mujeres mayores de 50 aos. Puede lograr el consumo diario recomendado de fibra si come una variedad de frutas, verduras, cereales y frijoles. El mdico tambin puede recomendar un suplemento de fibra si no es posible obtener suficiente fibra a travs de la dieta.  QU DEBO SABER ACERCA DE [place]?  La eficacia de los suplementos de [place] no ha sido estudiada [place], de modo que es mejor obtener fibra a travs de los alimentos.  Verifique siempre el contenido de fibra en la etiqueta de informacin nutricional de los alimentos preenvasados. Busque alimentos que contengan al menos 5 gramos de fibra por porcin.  Consulte al nutricionista si tiene preguntas sobre algunos alimentos especficos  relacionados con su enfermedad, especialmente si estos alimentos no se mencionan a continuacin.  Aumente el consumo diario de fibra en forma gradual. Aumentar demasiado rpido el consumo de fibra dietaria puede provocar meteorismo, clicos o gases.  Beber abundante agua. El [place] a Economist. QU ALIMENTOS PUEDO COMER?  Cereales  Panes integrales. Multicereales. Avena. Arroz integral. Dwyane Luo. Trigo burgol. Mijo. Muffins de salvado. Palomitas de maz. Galletas de centeno.  Verduras  Batatas. [place]. Col rizada. [place]. Repollo. Brcoli. Guisantes. [place]ias. [place].  Frutas  Frutos rojos. Peras. Manzanas. [place]. Ciruelas y pasas. Higos secos.  Carnes y otras fuentes de protenas  Frijoles blancos, colorados, pintos y porotos de soja. Guisantes secos. Lentejas. Frutos secos y semillas.  Lcteos  Yogur fortificado con Pharmacist, hospital.  Bebidas  Leche de soja fortificada con Fredderick Phenix. Jugo de naranja fortificado con Fredderick Phenix.  Otros  Barras de [place].  Los artculos mencionados arriba pueden no ser [employer] de las bebidas o los alimentos recomendados. Comunquese con el nutricionista para conocer ms opciones.  QU ALIMENTOS NO SE RECOMIENDAN?  Cereales  Pan blanco. Pastas hechas con Letitia Neri. Arroz blanco.  Verduras  Papas fritas. Verduras enlatadas. Verduras bien cocidas.  Frutas  Jugo de frutas. Frutas cocidas coladas.  Carnes y otras fuentes de protenas  Cortes de carne con Lobbyist. Aves o pescados fritos.  Lcteos  Donette Larry. Queso crema. [employer].  Bebidas  Gaseosas.  Otros  Tortas y pasteles. [place]ola y aceites.  Los artculos mencionados arriba pueden no ser [employer] de las bebidas y los alimentos que se Higher education careers adviser. Comunquese con el nutricionista para obtener ms informacin.  ALGUNOS CONSEJOS PARA INCLUIR ALIMENTOS RICOS EN FIBRA EN LA DIETA  Consuma una gran variedad de alimentos ricos en fibra.  Asegrese de que la  mitad de todos los cereales consumidos cada da sean cereales integrales.  Reemplace los panes y cereales hechos de harina refinada o harina blanca por panes y cereales integrales.  Reemplace el arroz blanco por arroz integral, trigo burgol o mijo.  Comience Games developer con un desayuno rico en [place], como un cereal que contenga al menos 5 gramos de fibra por porcin.  Use guisantes en lugar de carne en las sopas, ensaladas o pastas.  Coma bocadillos ricos en fibra, como frutos rojos, verduras crudas, frutos secos o palomitas de maz.

## 2019-09-03 NOTE — Op Note (Signed)
[facility] Patient Name: Ricardo Ward Procedure Date: 09/03/2019 1:38 PM MRN: [id number] Date of Birth: 12-13-1970 Attending MD: Barney Drain MD, MD CSN: [id number] Age: 49 Admit Type: Outpatient Procedure:                Colonoscopy WITH COLD SNARE POLYPECTOMY Indications:              Hematochezia Providers:                Barney Drain MD, MD, Charlsie Quest. Theda Sers RN, RN Referring MD:             Ladell Pier Medicines:                Meperidine 75 mg IV, Midazolam 4 mg IV Complications:            No immediate complications. Estimated Blood Loss:     Estimated blood loss was minimal. Procedure:                Pre-Anesthesia Assessment:                           - Prior to the procedure, a History and Physical                            was performed, and patient medications and                            allergies were reviewed. The patient's tolerance of                            previous anesthesia was also reviewed. The risks                            and benefits of the procedure and the sedation                            options and risks were discussed with the patient.                            All questions were answered, and informed consent                            was obtained. Prior Anticoagulants: The patient has                            taken no previous anticoagulant or antiplatelet                            agents. ASA Grade Assessment: II - A patient with                            mild systemic disease. After reviewing the risks                            and benefits, the patient was deemed in  satisfactory condition to undergo the procedure.                            After obtaining informed consent, the colonoscope                            was passed under direct vision. Throughout the                            procedure, the patient's blood pressure, pulse, and                            oxygen saturations were  monitored continuously. The                            CF-[id number]) scope was introduced through                            the anus and advanced to the 10 cm into the ileum.                            The colonoscopy was somewhat difficult due to a                            tortuous colon. Successful completion of the                            procedure was aided by straightening and shortening                            the scope to obtain bowel loop reduction and                            COLOWRAP. The patient tolerated the procedure well.                            The quality of the bowel preparation was excellent.                            The terminal ileum, ileocecal valve, appendiceal                            orifice, and rectum were photographed. Scope In: 1:50:43 PM Scope Out: 2:05:15 PM Scope Withdrawal Time: 0 hours 12 minutes 50 seconds  Total Procedure Duration: 0 hours 14 minutes 32 seconds  Findings:      A 5 mm polyp was found in the hepatic flexure. The polyp was sessile.       The polyp was removed with a cold snare. Resection and retrieval were       complete.      Multiple small and large-mouthed diverticula were found in the rectum       and entire colon.      Internal hemorrhoids were found. The hemorrhoids were moderate.      The recto-sigmoid  colon and sigmoid colon were mildly tortuous. Impression:               - One 5 mm polyp at the hepatic flexure, removed                            with a cold snare. Resected and retrieved.                           - Diverticulosis in the rectum and in the entire                            examined colon.                           - RECTAL BLEEDING DUE TO Internal hemorrhoids.                           - Tortuous colon. Moderate Sedation:      Moderate (conscious) sedation was administered by the endoscopy nurse       and supervised by the endoscopist. The following parameters were       monitored: oxygen  saturation, heart rate, blood pressure, and response       to care. Total physician intraservice time was 55 minutes. Recommendation:           - Patient has a contact number available for                            emergencies. The signs and symptoms of potential                            delayed complications were discussed with the                            patient. Return to normal activities tomorrow.                            Written discharge instructions were provided to the                            patient.                           - High fiber diet.                           - Continue present medications.                           - Await pathology results.                           - Repeat colonoscopy in 5-10 years for surveillance.                           - Return to GI office in 4 months. Procedure Code(s):        ---  Professional ---                           [phone], Colonoscopy, flexible; with removal of                            tumor(s), polyp(s), or other lesion(s) by snare                            technique                           99153, Moderate sedation; each additional 15                            minutes intraservice time                           99153, Moderate sedation; each additional 15                            minutes intraservice time                           99153, Moderate sedation; each additional 15                            minutes intraservice time                           G0500, Moderate sedation services provided by the                            same physician or other qualified health care                            professional performing a gastrointestinal                            endoscopic service that sedation supports,                            requiring the presence of an independent trained                            observer to assist in the monitoring of the                            patient's level of  consciousness and physiological                            status; initial 15 minutes of intra-service time;                            patient age 40 years or older (additional time 59  be reported with [phone], as appropriate) Diagnosis Code(s):        --- Professional ---                           K63.5, Polyp of colon                           K64.8, Other hemorrhoids                           K92.1, Melena (includes Hematochezia)                           K57.30, Diverticulosis of large intestine without                            perforation or abscess without bleeding                           Q43.8, Other specified congenital malformations of                            intestine CPT copyright 2019 American Medical Association. All rights reserved. The codes documented in this report are preliminary and upon coder review may  be revised to meet current compliance requirements. Barney Drain, MD Barney Drain MD, MD 09/03/2019 2:36:20 PM This report has been signed electronically. Number of Addenda: 0

## 2019-09-05 ENCOUNTER — Other Ambulatory Visit: Payer: Self-pay

## 2019-09-05 ENCOUNTER — Telehealth: Payer: Self-pay | Admitting: Gastroenterology

## 2019-09-05 LAB — SURGICAL PATHOLOGY

## 2019-09-05 NOTE — Telephone Encounter (Signed)
Please call pt. ONE SERRATED POLYP WAS removed.  DRINK WATER TO KEEP YOUR URINE LIGHT YELLOW.  FOLLOW A LOW FAT/HIGH FIBER DIET. MEATS SHOULD BE BAKED, BROILED, OR BOILED. AVOID FAST FOOD. AVOID FRIED FOOD.   ADD PROTONIX. TAKE 30 MINUTES PRIOR TO MEALS TWICE DAILY FOR 3 MOS THEN ONCE DAILY FOREVER.  CONTINUE IRON FOR 3 MOS. STOP AROUND APR 15.  USE PREPARATION H FOUR TIMES  A DAY IF NEEDED TO RELIEVE RECTAL PAIN/PRESSURE/BLEEDING.  SEE SURGERY TO FIX YOUR HEMORRHOIDS.  FOLLOW UP IN 4 MOS.   Next colonoscopy in 5 years. YOUR SISTERS, BROTHERS, CHILDREN, AND PARENTS NEED TO HAVE A COLONOSCOPY STARTING AT THE AGE OF 45.

## 2019-09-05 NOTE — Telephone Encounter (Signed)
I called for Spanish Interpreter @ [phone] and spoke with [facility] # [id number]. He called pt and left vm for pt to call office for results.

## 2019-09-08 NOTE — Telephone Encounter (Signed)
OV made and reminder in epic °

## 2019-09-10 NOTE — Telephone Encounter (Signed)
I got [place] interpreter, Basilia Jumbo [phone] and she called pt and he is aware of results. He already has appt with Surgeon for Feb 4th.

## 2019-09-23 ENCOUNTER — Ambulatory Visit: Payer: Self-pay | Admitting: General Surgery

## 2019-09-25 ENCOUNTER — Encounter: Payer: Self-pay | Admitting: General Surgery

## 2019-09-25 ENCOUNTER — Ambulatory Visit (INDEPENDENT_AMBULATORY_CARE_PROVIDER_SITE_OTHER): Payer: Self-pay | Admitting: General Surgery

## 2019-09-25 ENCOUNTER — Other Ambulatory Visit: Payer: Self-pay

## 2019-09-25 VITALS — BP 146/89 | HR 56 | Temp 98.3°F | Resp 16 | Ht 67.0 in | Wt 203.0 lb

## 2019-09-25 DIAGNOSIS — K648 Other hemorrhoids: Secondary | ICD-10-CM

## 2019-09-25 NOTE — Patient Instructions (Signed)
Hemorroides Hemorrhoids Las hemorroides son venas inflamadas adentro o alrededor del recto o del ano. Hay dos tipos de hemorroides:  Hemorroides internas. Se forman en las venas del interior del recto. Pueden abultarse hacia afuera, irritarse y doler.  Hemorroides externas. Se producen en las venas externas del ano y pueden sentirse como un bulto o zona hinchada, dura y dolorosa cerca del ano. [place] hemorroides no causan problemas graves y se Engineer, petroleum con tratamientos caseros [employer] cambios en la dieta y el estilo de vida. Si los tratamientos caseros no ayudan con los sntomas, se pueden Optometrist procedimientos para reducir o extirpar las hemorroides. Cules son las causas? La causa de esta afeccin es el aumento de la presin en la zona anal. Esta presin puede ser causada por distintos factores, por ejemplo:  Estreimiento.  Hacer un gran esfuerzo para defecar.  Diarrea.  Embarazo.  Obesidad.  Estar sentado durante largos perodos de [place].  Levantar objetos pesados u otras actividades que impliquen esfuerzo.  Sexo anal.  Andar en bicicleta por un largo perodo de tiempo. Cules son los signos o los sntomas? Los sntomas de esta afeccin incluyen los siguientes:  Social research officer, government.  Picazn o irritacin anal.  Sangrado rectal.  Prdida de materia fecal (heces).  Inflamacin anal.  Uno o ms bultos alrededor del ano. Cmo se diagnostica? Esta afeccin se diagnostica frecuentemente a travs de un examen visual. Posiblemente le realicen otros tipos de pruebas o estudios, como los siguientes:  Un examen que implica palpar el rea rectal con la mano enguantada (examen rectal digital).  Un examen del canal anal que se realiza utilizando un pequeo tubo (anoscopio).  Anlisis de sangre si ha perdido [place] cantidad significativa de [place].  Una prueba que consiste en la observacin del interior del colon utilizando un tubo flexible con una cmara en el  extremo (sigmoidoscopia o colonoscopa). Cmo se trata? Esta afeccin generalmente se puede tratar en el hogar. Sin embargo, se pueden TEFL teacher procedimientos si los cambios en la dieta, en el estilo de vida y otros tratamientos caseros no [employer] sntomas. Estos procedimientos pueden ayudar a reducir o [place] hemorroides completamente. Algunos de estos procedimientos son quirrgicos y otros no. Algunos de los procedimientos ms frecuentes son los siguientes:  Ligadura con Forensic psychologist. Las bandas elsticas se colocan en la base de las hemorroides para interrumpir su irrigacin de [place].  Escleroterapia. Se inyecta un medicamento en las hemorroides para reducir su tamao.  Coagulacin con luz infrarroja. Se utiliza un tipo de energa lumnica para eliminar las hemorroides.  Hemorroidectoma. Las hemorroides se extirpan con [place] y las venas que las [place] se IT consultant.  Hemorroidopexia con grapas. El cirujano engrapa la base de las hemorroides a la pared del recto. Siga estas indicaciones en su casa: Comida y bebida   Consuma alimentos con alto contenido de [place], como cereales integrales, porotos, frutos secos, frutas y verduras.  Pregntele a su mdico acerca de tomar productos con fibra aadida en ellos (complementos de fibra).  Disminuya la cantidad de grasa de la dieta. Esto se puede lograr consumiendo productos lcteos con bajo contenido de grasas, ingiriendo menor cantidad de carnes rojas y evitando los alimentos procesados.  Beba suficiente lquido como para Theatre manager la orina de color amarillo plido. Control del dolor y la hinchazn   Tome baos de asiento tibios durante 20 minutos, 3 o 4 veces por da para Glass blower/designer y las [place]. Puede hacer esto en Isaiah Blakes o usar un  dispositivo porttil para bao de asiento que se coloca sobre el inodoro.  Si se lo indican, aplique hielo en la zona afectada. Usar compresas de [employer] baos de asiento  puede ser [place]. ? Ponga el hielo en una bolsa plstica. ? Coloque una [employer] piel y Therapist, nutritional. ? Coloque el hielo durante 59minutos, 2 a 3veces por da. Indicaciones generales  [employer] de venta libre y los recetados solamente como se lo haya indicado el mdico.  Aplquese los medicamentos, cremas o supositorios como se lo hayan indicado.  Haga ejercicio con regularidad. Consulte al mdico qu cantidad y qu tipo de ejercicio es mejor para usted. En general, debe realizar al menos 25minutos de ejercicio moderado la [employer] de la semana (150 minutos cada semana). Esto puede incluir [employer], andar en bicicleta o practicar yoga.  Vaya al bao cuando sienta la necesidad de defecar. No espere.  Evite hacer fuerza en las deposiciones.  Mantenga la zona anal limpia y seca. Use papel higinico hmedo o toallitas humedecidas despus de las deposiciones.  No pase mucho tiempo sentado en el inodoro. Esto aumenta la afluencia de sangre y Conservation officer, historic buildings.  Concurra a todas las visitas de seguimiento como se lo haya indicado el mdico. Esto es importante. Comunquese con un mdico si tiene:  Aumento del dolor y la hinchazn que no puede controlar con medicamentos o Clinical research associate.  No puede defecar o lo hace con dificultad.  Dolor o tiene inflamacin fuera de la zona de las hemorroides. Solicite ayuda inmediatamente si tiene:  Hemorragia descontrolada en el recto. Resumen  Las hemorroides son venas inflamadas adentro o alrededor del recto o del ano.  La mayora de las hemorroides se pueden controlar con tratamientos caseros como cambios en la dieta y el estilo de vida.  Tomar baos de asiento con agua tibia puede ayudar a Best boy y las [place].  En los casos graves, se pueden realizar procedimientos o Ardelia Mems ciruga para reducir o [place] hemorroides. Esta informacin no tiene Marine scientist el consejo del mdico. Asegrese de  hacerle al mdico cualquier pregunta que tenga. Document Revised: 02/14/2018 Document Reviewed: 02/14/2018 Elsevier Patient Education  [place].

## 2019-09-26 NOTE — H&P (Signed)
Ricardo Ward; [id number]; 16-Sep-1970   HPI Patient is a 49 year old Hispanic male who was referred to my care by Drs. Wynetta Emery and Fields for evaluation treatment of bleeding hemorrhoidal disease.  History was obtained through an interpreter.  Patient has had intermittent bleeding for many months.  He recently underwent a colonoscopy which showed bleeding internal hemorrhoidal disease.  Patient denies constipation.  The blood turns the toilet bowl red on occasion.  He states he has tried suppositories and creams, but the situation persists.  He has 0 out of 10 rectal pain. Past Medical History:  Diagnosis Date  . COVID-19 12/2018   s/p hospitalization and recovery  . Diabetes ([place])   . Pterygium    rt eye  . Pterygium of both eyes    removal    Past Surgical History:  Procedure Laterality Date  . APPENDECTOMY    . BACK SURGERY    . BIOPSY  09/03/2019   Procedure: BIOPSY;  Surgeon: Danie Binder, MD;  [facility];  Service: Endoscopy;;  gastric  . COLONOSCOPY N/A 09/03/2019   Procedure: COLONOSCOPY;  Surgeon: Danie Binder, MD;  [facility];  Service: Endoscopy;  Laterality: N/A;  3:00pm  . ESOPHAGOGASTRODUODENOSCOPY N/A 09/03/2019   Procedure: ESOPHAGOGASTRODUODENOSCOPY (EGD);  Surgeon: Danie Binder, MD;  [facility];  Service: Endoscopy;  Laterality: N/A;  . EYE SURGERY Bilateral 06   pterygium   . POLYPECTOMY  09/03/2019   Procedure: POLYPECTOMY;  Surgeon: Danie Binder, MD;  [facility];  Service: Endoscopy;;  hepatic flexure  . PTERYGIUM EXCISION Bilateral   . PTERYGIUM EXCISION Right 11/12/2013   Procedure: PTERYGIUM EXCISION WITH MITOMYCIN C AND AMNIOTIC MEMBRANE USING TISSEEL GLUE RIGHT EYE;  Surgeon: Marylynn Pearson, MD;  Location: [facility];  Service: Ophthalmology;  Laterality: Right;    Family History  Problem Relation Age of Onset  . Alzheimer's disease Mother   . Emphysema Father   . Colon cancer Neg Hx     Current  Outpatient Medications on File Prior to Visit  Medication Sig Dispense Refill  . atorvastatin (LIPITOR) 20 MG tablet Take 1 tablet (20 mg total) by mouth daily. 90 tablet 3  . blood glucose meter kit and supplies Dispense based on patient and insurance preference. Use up to four times daily as directed. (FOR ICD-10 E10.9, E11.9). 1 each 0  . hydrocortisone (ANUSOL-HC) 2.5 % rectal cream Place 1 application rectally 2 (two) times daily. (Patient not taking: Reported on 09/26/2019) 30 g 1  . metFORMIN (GLUCOPHAGE) 500 MG tablet Take 1 tablet (500 mg total) by mouth 2 (two) times daily with a meal. 60 tablet 4  . Multiple Vitamin (MULTIVITAMIN WITH MINERALS) TABS tablet Take 1 tablet by mouth daily.    . pantoprazole (PROTONIX) 40 MG tablet 1 PO 30 MINUTES PRIOR TO MEALS BID FOR 3 MOS THEN QD (Patient taking differently: Take 40 mg by mouth 2 (two) times daily. ) 60 tablet 11  . ferrous sulfate 325 (65 FE) MG tablet Take 1 tablet (325 mg total) by mouth daily with breakfast. 100 tablet 1   No current facility-administered medications on file prior to visit.    Allergies  Allergen Reactions  . Squid Oil Rash    Squid causes a rash    Social History   Substance and Sexual Activity  Alcohol Use No    Social History   Tobacco Use  Smoking Status Former Smoker  . Packs/day: 1.00  .  Years: 13.00  . Pack years: 13.00  . Types: Cigarettes  . Quit date: 12/10/1998  . Years since quitting: 20.8  Smokeless Tobacco Never Used    Review of Systems  Constitutional: Negative.   HENT: Negative.   Eyes: Negative.   Respiratory: Negative.   Cardiovascular: Negative.   Gastrointestinal: Positive for abdominal pain.  Genitourinary: Negative.   Musculoskeletal: Positive for joint pain.  Skin: Negative.   Neurological: Negative.   Endo/Heme/Allergies: Negative.   Psychiatric/Behavioral: Negative.     Objective   Vitals:   09/25/19 0910  BP: (!) 146/89  Pulse: (!) 56  Resp: 16  Temp:  98.3 F (36.8 C)  SpO2: 98%    Physical Exam Vitals reviewed.  Constitutional:      Appearance: Normal appearance. He is not ill-appearing.  HENT:     Head: Normocephalic and atraumatic.  Cardiovascular:     Rate and Rhythm: Normal rate and regular rhythm.     Heart sounds: Normal heart sounds. No murmur. No friction rub. No gallop.   Pulmonary:     Effort: Pulmonary effort is normal. No respiratory distress.     Breath sounds: Normal breath sounds. No stridor. No wheezing, rhonchi or rales.  Genitourinary:    Comments: Digital rectal examination reveals no active bleeding.  Sphincter tone is normal.  No external hemorrhoids are noted.  He does have palpable internal hemorrhoids present.  No fissures present.  No fistulas present. Skin:    General: Skin is warm and dry.  Neurological:     Mental Status: He is alert and oriented to person, place, and time.    Colonoscopy report reviewed Assessment  Bleeding internal hemorrhoids Plan   Patient is scheduled for hemorrhoidectomy on 10/01/2019.  The risks and benefits of the procedure including bleeding, infection, and the possibility of recurrence of the hemorrhoids were fully explained to the patient, who gave informed consent.

## 2019-09-26 NOTE — Progress Notes (Signed)
Ricardo Ward; [id number]; 25-Nov-1970   HPI Patient is a 49 year old Hispanic male who was referred to my care by Drs. Wynetta Emery and Fields for evaluation treatment of bleeding hemorrhoidal disease.  History was obtained through an interpreter.  Patient has had intermittent bleeding for many months.  He recently underwent a colonoscopy which showed bleeding internal hemorrhoidal disease.  Patient denies constipation.  The blood turns the toilet bowl red on occasion.  He states he has tried suppositories and creams, but the situation persists.  He has 0 out of 10 rectal pain. Past Medical History:  Diagnosis Date  . COVID-19 12/2018   s/p hospitalization and recovery  . Diabetes ([place])   . Pterygium    rt eye  . Pterygium of both eyes    removal    Past Surgical History:  Procedure Laterality Date  . APPENDECTOMY    . BACK SURGERY    . BIOPSY  09/03/2019   Procedure: BIOPSY;  Surgeon: Danie Binder, MD;  Location: [facility];  Service: Endoscopy;;  gastric  . COLONOSCOPY N/A 09/03/2019   Procedure: COLONOSCOPY;  Surgeon: Danie Binder, MD;  Location: [facility];  Service: Endoscopy;  Laterality: N/A;  3:00pm  . ESOPHAGOGASTRODUODENOSCOPY N/A 09/03/2019   Procedure: ESOPHAGOGASTRODUODENOSCOPY (EGD);  Surgeon: Danie Binder, MD;  Location: [facility];  Service: Endoscopy;  Laterality: N/A;  . EYE SURGERY Bilateral 06   pterygium   . POLYPECTOMY  09/03/2019   Procedure: POLYPECTOMY;  Surgeon: Danie Binder, MD;  Location: [facility];  Service: Endoscopy;;  hepatic flexure  . PTERYGIUM EXCISION Bilateral   . PTERYGIUM EXCISION Right 11/12/2013   Procedure: PTERYGIUM EXCISION WITH MITOMYCIN C AND AMNIOTIC MEMBRANE USING TISSEEL GLUE RIGHT EYE;  Surgeon: Marylynn Pearson, MD;  Location: [place];  Service: Ophthalmology;  Laterality: Right;    Family History  Problem Relation Age of Onset  . Alzheimer's disease Mother   . Emphysema Father   . Colon cancer Neg Hx     Current  Outpatient Medications on File Prior to Visit  Medication Sig Dispense Refill  . atorvastatin (LIPITOR) 20 MG tablet Take 1 tablet (20 mg total) by mouth daily. 90 tablet 3  . blood glucose meter kit and supplies Dispense based on patient and insurance preference. Use up to four times daily as directed. (FOR ICD-10 E10.9, E11.9). 1 each 0  . hydrocortisone (ANUSOL-HC) 2.5 % rectal cream Place 1 application rectally 2 (two) times daily. (Patient not taking: Reported on 09/26/2019) 30 g 1  . metFORMIN (GLUCOPHAGE) 500 MG tablet Take 1 tablet (500 mg total) by mouth 2 (two) times daily with a meal. 60 tablet 4  . Multiple Vitamin (MULTIVITAMIN WITH MINERALS) TABS tablet Take 1 tablet by mouth daily.    . pantoprazole (PROTONIX) 40 MG tablet 1 PO 30 MINUTES PRIOR TO MEALS BID FOR 3 MOS THEN QD (Patient taking differently: Take 40 mg by mouth 2 (two) times daily. ) 60 tablet 11  . ferrous sulfate 325 (65 FE) MG tablet Take 1 tablet (325 mg total) by mouth daily with breakfast. 100 tablet 1   No current facility-administered medications on file prior to visit.    Allergies  Allergen Reactions  . Squid Oil Rash    Squid causes a rash    Social History   Substance and Sexual Activity  Alcohol Use No    Social History   Tobacco Use  Smoking Status Former Smoker  . Packs/day: 1.00  .  Years: 13.00  . Pack years: 13.00  . Types: Cigarettes  . Quit date: 12/10/1998  . Years since quitting: 20.8  Smokeless Tobacco Never Used    Review of Systems  Constitutional: Negative.   HENT: Negative.   Eyes: Negative.   Respiratory: Negative.   Cardiovascular: Negative.   Gastrointestinal: Positive for abdominal pain.  Genitourinary: Negative.   Musculoskeletal: Positive for joint pain.  Skin: Negative.   Neurological: Negative.   Endo/Heme/Allergies: Negative.   Psychiatric/Behavioral: Negative.     Objective   Vitals:   09/25/19 0910  BP: (!) 146/89  Pulse: (!) 56  Resp: 16  Temp:  98.3 F (36.8 C)  SpO2: 98%    Physical Exam Vitals reviewed.  Constitutional:      Appearance: Normal appearance. He is not ill-appearing.  HENT:     Head: Normocephalic and atraumatic.  Cardiovascular:     Rate and Rhythm: Normal rate and regular rhythm.     Heart sounds: Normal heart sounds. No murmur. No friction rub. No gallop.   Pulmonary:     Effort: Pulmonary effort is normal. No respiratory distress.     Breath sounds: Normal breath sounds. No stridor. No wheezing, rhonchi or rales.  Genitourinary:    Comments: Digital rectal examination reveals no active bleeding.  Sphincter tone is normal.  No external hemorrhoids are noted.  He does have palpable internal hemorrhoids present.  No fissures present.  No fistulas present. Skin:    General: Skin is warm and dry.  Neurological:     Mental Status: He is alert and oriented to person, place, and time.    Colonoscopy report reviewed Assessment  Bleeding internal hemorrhoids Plan   Patient is scheduled for hemorrhoidectomy on 10/01/2019.  The risks and benefits of the procedure including bleeding, infection, and the possibility of recurrence of the hemorrhoids were fully explained to the patient, who gave informed consent.

## 2019-09-29 ENCOUNTER — Other Ambulatory Visit (HOSPITAL_COMMUNITY): Payer: Self-pay

## 2019-09-29 ENCOUNTER — Encounter (HOSPITAL_COMMUNITY)
Admission: RE | Admit: 2019-09-29 | Discharge: 2019-09-29 | Disposition: A | Discharge status: Home / Self Care | Payer: Self-pay | Source: Ambulatory Visit | Attending: General Surgery | Admitting: General Surgery

## 2019-09-29 ENCOUNTER — Other Ambulatory Visit: Payer: Self-pay

## 2019-09-29 ENCOUNTER — Encounter (HOSPITAL_COMMUNITY): Payer: Self-pay

## 2019-09-29 ENCOUNTER — Other Ambulatory Visit (HOSPITAL_COMMUNITY)
Admission: RE | Admit: 2019-09-29 | Discharge: 2019-09-29 | Disposition: A | Discharge status: Home / Self Care | Payer: Self-pay | Source: Ambulatory Visit | Attending: General Surgery | Admitting: General Surgery

## 2019-09-29 DIAGNOSIS — Z01812 Encounter for preprocedural laboratory examination: Secondary | ICD-10-CM | POA: Diagnosis present

## 2019-09-29 DIAGNOSIS — Z20822 Contact with and (suspected) exposure to covid-19: Secondary | ICD-10-CM | POA: Diagnosis not present

## 2019-09-29 LAB — SARS CORONAVIRUS 2 (TAT 6-24 HRS): SARS Coronavirus 2: NEGATIVE

## 2019-09-29 NOTE — Patient Instructions (Signed)
Instrucciones Para Antes de la Ciruga   Su ciruga est programada para-(your procedure is scheduled on)  10/01/2019 at [place]     Por favor llame al [phone] si tiene algn problema en la maana de la ciruga. (please call if you have any problems the morning of surgery.)                  Recuerde: (Remember)   No coma alimentos ni tome lquidos, incluyendo agua, despus de la medianoche del  (Do not eat food or drink liquids including water after midnight on 09/30/2019.   Tome estas medicinas en la maana de la ciruga con un SORBITO de agua (take these meds the morning of surgery with a SIP of water) Protonix   Puede cepillarse los dientes en la maana de la [place]. (you may brush your teeth the morning of surgery)   No use joyas, maquillaje de ojos, lpiz labial, crema para el cuerpo o esmalte de uas oscuro. (Do not wear jewelry, eye makeup, lipstick, body lotion, or dark fingernail polish)   No puede usar desodorante. (you may wear deodorant)   Si va a ser ingresado despues de la ciruga, deje la maleta en el carro hasta que se le haya asignado una habitacin. (If you are to be admitted after surgery, leave suitcase in car until your room has been assigned.)   A los pacientes que se les d de alta el mismo da no se les permitir manejar a casa.  (Patients discharged on the day of surgery will not be allowed to drive home)   Use ropa suelta y cmoda de regreso a casa. (wear loose comfortable clothes for ride home)     Procedimientos quirrgicos para las hemorroides Surgical Procedures for Hemorrhoids Se pueden utilizar procedimientos quirrgicos para tratar las hemorroides. Las hemorroides son venas inflamadas que se encuentran dentro del recto (hemorroides internas) o alrededor del ano (hemorroides externas). La causa de las hemorroides es el aumento de la presin en la zona anal. Esta presin  puede ser resultado de hacer esfuerzos para Landscape architect (estreimiento), de diarrea, de embarazo, de obesidad o de Public affairs consultant sentado durante largos perodos. Las hemorroides pueden causar sntomas tales como dolor y [place]. Puede ser necesario realizar una ciruga si los cambios en la dieta, en el estilo de vida y otros tratamientos no [employer] sntomas. Los mtodos quirrgicos frecuentes que pueden usarse incluyen los siguientes:  Hemorroidectoma cerrada. Las hemorroides se extirpan Facilities manager, y las incisiones se cierran con puntos (suturas).  Hemorroidectoma abierta. Las hemorroides se extirpan Facilities manager, pero se deja que las incisiones cicatricen sin suturas.  Hemorroidectoma con grapas. Las hemorroides se extirpan parcialmente y las incisiones se cierran con grapas. Informe al mdico acerca de lo siguiente:  Cualquier alergia que tenga.  Todos los [employer], incluidos vitaminas, hierbas, gotas oftlmicas, cremas y medicamentos de venta libre.  Cualquier problema previo que usted o algn miembro de su familia hayan tenido con los anestsicos.  Cualquier trastorno de la sangre que tenga.  Cirugas a las que se haya sometido.  Cualquier afeccin mdica que tenga.  Si est embarazada o podra estarlo. Cules son los riesgos? En general, se  trata de un procedimiento seguro. Sin embargo, pueden ocurrir complicaciones, por ejemplo:  Infeccin.  Sangrado.  Reacciones alrgicas a los medicamentos.  Daos a otras estructuras u rganos.  Dolor.  Estreimiento.  Tener dificultad para Garment/textile technologist.  Estrechamiento del conducto anal (estenosis).  Dificultad para controlar la defecacin (incontinencia).  Hemorroides recurrentes.  Formacin de [place] conexin nueva (fstula) entre el ano o el recto y otra zona. Qu ocurre antes del procedimiento? Medicamentos  Consulte al mdico sobre: ? Quarry manager o suspender los medicamentos que toma habitualmente. Esto  es muy importante si toma medicamentos para la diabetes o anticoagulantes. ? Tomar medicamentos como aspirina e ibuprofeno. Estos medicamentos pueden tener un efecto anticoagulante en la [place]. No tome estos medicamentos a menos que el mdico se lo indique. ? Tomar medicamentos de [employer], vitaminas, hierbas y suplementos. Mantenerse hidratado Siga las instrucciones del mdico acerca de mantenerse hidratado, las cuales pueden incluir lo siguiente:  Hasta doshoras antes del procedimiento, puede beber lquidos transparentes, como agua, jugos de fruta sin pulpa, caf negro y t solo.  Comida y bebida Siga las instrucciones del mdico respecto de las restricciones de comidas o bebidas, las cuales pueden incluir lo siguiente:  Ocho horas antes del procedimiento, deje de ingerir comidas o alimentos pesados, como carne, alimentos fritos o alimentos grasos.  Seis horas antes del procedimiento, deje de ingerir comidas o alimentos livianos, como tostadas o cereales.  Seis horas antes del procedimiento, deje de beber [place] o bebidas que [employer].  Dos horas antes del procedimiento, deje de beber lquidos transparentes. Instrucciones generales  Tal vez deba someterse a un procedimiento para examinar el interior del colon con un endoscopio (colonoscopa). El mdico puede realizarlo para asegurarse de que el sangrado o el dolor no tengan otras causas.  Pueden indicarle que tome un laxante y se haga un enema para limpiar el colon antes de la ciruga (preparacin del intestino). Siga cuidadosamente las instrucciones del mdico acerca de la preparacin del intestino.  Haga que alguien lo lleve a su casa desde el hospital o la clnica.  Haga que un adulto responsable lo cuide durante al menos 24horas despus de que le den el alta del hospital o de la [place]. Esto es importante.  Pregntele al mdico: ? Cmo se Tax inspector de la Leisure centre manager. ? Qu medidas se tomarn para evitar una  infeccin. Estas medidas pueden incluir:  Lavar la piel con un jabn antisptico.  Tomar antibiticos. Qu ocurre durante el procedimiento?  Le colocarn una va intravenosa en una vena.  Le administrarn uno o ms de los siguientes medicamentos: ? Un medicamento para ayudar a relajarse (sedante). ? Un medicamento para adormecer la zona (anestesia local). ? Un medicamento que lo har dormir (anestesia general). ? Un medicamento que se inyecta en una zona del cuerpo para adormecer toda la regin que se encuentra por debajo del sitio de la inyeccin (anestesia regional).  Pueden colocarle un gel lubricante en el recto.  El cirujano introducir un endoscopio corto (anoscopio) en el recto para examinar las hemorroides.  Se utilizar uno de los siguientes mtodos quirrgicos para extirpar las hemorroides: ? Hemorroidectoma cerrada.  El cirujano utilizar instrumentos quirrgicos para abrir el tejido que rodea las hemorroides.  Las venas que las irrigan se ligarn con una sutura.  Se extirparn las hemorroides.  El tejido que las rodea se cerrar con suturas que el cuerpo puede reabsorber (suturas reabsorbibles). ? Hemorroidectoma abierta.  Se extirparn las hemorroides con instrumentos quirrgicos.  Se dejarn abiertas  las incisiones para que cicatricen sin suturas. ? Hemorroidectoma con grapas.  El cirujano utilizar una grapadora quirrgica circular para extirpar parcialmente las hemorroides.  El dispositivo se introducir en el ano. Esta permitir la extirpacin de un anillo circular de tejido que incluye tejido hemorroidal y parte del tejido que se encuentra por encima de las hemorroides.  Las grapas en el dispositivo cerrarn los bordes del tejido. Esto interrumpir la irrigacin de sangre a las hemorroides restantes y Publishing rights manager el tejido nuevamente en Chief of Staff. Cada uno de estos procedimientos puede variar segn el mdico y el hospital. Sander Nephew ocurre despus del  procedimiento?  Pueden controlarle la presin arterial, la frecuencia cardaca, la frecuencia respiratoria y Retail buyer de oxgeno en la sangre hasta que le den el alta del hospital o la clnica.  Le darn analgsicos si los necesita.  No conduzca durante 24horas si le administraron un sedante durante el procedimiento. Resumen  Puede ser necesario realizar una ciruga para las hemorroides si los cambios en la dieta, en el estilo de vida y otros tratamientos no [employer] sntomas.  Hay tres mtodos frecuentes de ciruga que se usan para tratar las hemorroides.  Siga las instrucciones del mdico con respecto a los medicamentos y a los alimentos y las bebidas antes del procedimiento.  Pueden indicarle que tome un laxante y se haga un enema para limpiar el colon antes de la ciruga (preparacin del intestino). Esta informacin no tiene Marine scientist el consejo del mdico. Asegrese de hacerle al mdico cualquier pregunta que tenga. Document Revised: 08/30/2018 Document Reviewed: 08/30/2018 Elsevier Patient Education  [place] general en adultos, cuidados posteriores General Anesthesia, Adult, Care After Lea esta informacin sobre cmo cuidarse despus del procedimiento. El mdico tambin podr darle instrucciones ms especficas. Comunquese con su mdico si tiene problemas o preguntas. Qu puedo esperar despus del procedimiento? Luego del procedimiento, son comunes los siguiente efectos secundarios:  Dolor o Scientist, research (life sciences) en el lugar de la va intravenosa (i.v.).  Nuseas.  Vmitos.  Dolor de Investment banker, operational.  Dificultad para concentrarse.  Sentir fro o [employer].  Debilidad o cansancio.  Somnolencia y Programmer, applications.  Malestar y Hydrologist. Estos efectos secundarios pueden afectar partes del cuerpo que no estuvieron involucradas en la ciruga. Siga estas indicaciones en su casa:  Durante al menos 24horas despus del procedimiento:  Pdale a un  adulto responsable que permanezca con usted. Es importante que alguien cuide de usted hasta que se despierte y Cabin crew.  Descanse todo lo que sea necesario.  No haga lo siguiente: ? Participar en actividades en las que podra caerse o lastimarse. ? Conducir. ? Operar maquinarias pesadas. ? Beber alcohol. ? Tomar somnferos o medicamentos que causen somnolencia. ? Firmar documentos legales ni tomar [employer]. ? Cuidar a nios por su cuenta. Qu debe comer y beber  Siga las indicaciones del mdico respecto de las restricciones de comidas o bebidas.  Cuando [employer], comience a comer cantidades pequeas de alimentos que sean blandos y fciles de Publishing copy (livianos), como una tostada. Retome su dieta habitual de forma gradual.  Beba suficiente lquido como para mantener la orina de color amarillo plido.  Si vomita, rehidrtese tomando agua, jugo o caldo transparente. Instrucciones generales  Si tiene apnea del sueo, la [place] y ciertos medicamentos pueden aumentar el riesgo de problemas respiratorios. Siga las indicaciones del mdico respecto al uso de su dispositivo para dormir: ? Siempre que duerma, incluso durante las siestas que tome Psychologist, sport and exercise. ?  Mientras tome analgsicos recetados, medicamentos para dormir o medicamentos que producen somnolencia.  Reanude sus actividades normales segn lo indicado por el mdico. Pregntele al mdico qu actividades son seguras para usted.  Tome los medicamentos de venta libre y los recetados solamente como se lo haya indicado el mdico.  Si fuma, no lo haga sin supervisin.  Concurra a todas las visitas de seguimiento como se lo haya indicado el mdico. Esto es importante. Comunquese con un mdico si:  Tiene nuseas o vmitos que no mejoran con medicamentos.  No puede comer ni beber sin vomitar.  El dolor no se alivia con medicamentos.  No puede orinar.  Tiene una erupcin cutnea.  Tiene fiebre.  Presenta  enrojecimiento alrededor del lugar de la va intravenosa (i.v.) que empeora. Solicite ayuda de inmediato si:  Tiene dificultad para respirar.  Siente dolor en el pecho.  Observa sangre en la orina o heces, o vomita sangre. Resumen  Despus del procedimiento, es comn tener dolor de garganta y nuseas. Tambin es comn sentirse cansado.  Pdale a un adulto responsable que permanezca con usted durante 24 horas despus de la anestesia general. Es importante que alguien cuide de usted hasta que se despierte y Cabin crew.  Cuando [employer], comience a comer cantidades pequeas de alimentos que sean blandos y fciles de Publishing copy (livianos), como una tostada. Retome su dieta habitual de forma gradual.  Beba suficiente lquido como para mantener la orina de color amarillo plido.  Reanude sus actividades normales segn lo indicado por el mdico. Pregntele al mdico qu actividades son seguras para usted. Esta informacin no tiene Marine scientist el consejo del mdico. Asegrese de hacerle al mdico cualquier pregunta que tenga. Document Revised: 06/04/2017 Document Reviewed: 06/04/2017 Elsevier Patient Education  [place] usar clorhexidina para baarse How to Use Chlorhexidine for Bathing El gluconato de clorhexidina (CHG) es una solucin desinfectante (antisptica) que se utiliza para limpiar la piel. Puede eliminar las bacterias que normalmente viven en la piel y mantenerlas alejadas durante aproximadamente 24 horas. Para limpiarse la piel con CHG, es posible que le den lo siguiente:  Una solucin de CHG para usar en la ducha o como parte de un bao de [place].  Un pao preenvasado que contenga CHG. Limpiar la piel con CHG puede ayudar a disminuir el riesgo de infeccin:  Mientras permanece en la unidad de cuidados intensivos del hospital.  Si tiene un acceso vascular, como una va central, para proporcionar acceso a corto o largo plazo a las venas.  Si tiene un  catter para que drene orina de la vejiga.  Si tiene Animal nutritionist. El respirador es un aparato que lo ayuda a Ambulance person al hacer que el aire entre y salga de sus pulmones.  Despus de [place]. Cules son los riesgos? Los riesgos de usar de CHG incluyen los siguientes:  Reacciones en la piel.  Prdida de la audicin si el CHG ingresa en los odos.  Lesin ocular si el CHG ingresa en los ojos y no se enjuaga.  El CHG es inflamable. Asegrese de no fumar ni acercarse al fuego luego de aplicarse CHG en la piel. No use CHG:  Si es alrgico a la clorhexidina o anteriormente tuvo una reaccin a la clorhexidina.  En bebs de menos de 2 meses. Cmo usar la solucin de CHG  Use CHG nicamente como se lo indique su mdico y siga las instrucciones de la etiqueta.  Use la cantidad de CHG que le indicaron. Usualmente,  la medida es una botella. Durante una ducha Siga estos pasos al usar la solucin de CHG durante una ducha (a menos que su mdico le brinde instrucciones diferentes): 1. Comience a ducharse. 2. Lvese la cara y el cabello con el jabn y el champ que utiliza habitualmente. Bejou de abajo del agua. 4. Vierta el CHG en un pao limpio. No use ningn cepillo o esponja con bordes rugosos. 5. Comience en el cuello y colquese la espuma en todo el cuerpo [employer]. Asegrese de seguir estas instrucciones: ? Si le harn una ciruga, preste especial atencin a la parte del cuerpo Research officer, political party. Frote la zona durante al menos 1 minuto. ? No use el CHG en su cabeza o cara. Si la solucin [employer] odos o los ojos, enjuague bien con [place]. ? Evite la zona genital. ? Evite las zonas de la piel que estn lastimadas o tengan cortes o raspaduras. ? Frote su espalda y [employer]. Asegrese de lavar los pliegues de la piel. 6. Deje actuar la espuma por 1 o 2 minutos, o por el tiempo que le haya indicado el mdico. 7. Enjuguese todo el  cuerpo bajo la ducha. Asegrese de enjuagar bien todos los pliegues y hendiduras de la piel. 8. Seque con una toalla limpia. Despus no aplique ninguna sustancia en el cuerpo, como polvo, locin o perfume, excepto que se lo indique el mdico. Solo use las lociones recomendadas por el fabricante. 9. Pngase pijama o ropa limpia. 10. Si es la noche anterior a la ciruga, duerma en sbanas limpias.  Durante un bao de esponja Siga estos pasos al usar la solucin de CHG durante un bao de [place] (a menos que su mdico le brinde instrucciones diferentes): 1. Lvese la cara y el cabello con el jabn y el champ que utiliza habitualmente. 2. Vierta el CHG en un pao limpio. 3. Comience en el cuello y colquese la espuma en todo el cuerpo [employer]. Asegrese de seguir estas instrucciones: ? Si le harn una ciruga, preste especial atencin a la parte del cuerpo Research officer, political party. Frote la zona durante al menos 1 minuto. ? No use el CHG en su cabeza o cara. Si la solucin [employer] odos o los ojos, enjuague bien con [place]. ? Evite la zona genital. ? Evite las zonas de la piel que estn lastimadas o tengan cortes o raspaduras. ? Frote su espalda y [employer]. Asegrese de lavar los pliegues de la piel. 4. Deje actuar la espuma por 1 o 2 minutos, o por el tiempo que le haya indicado el mdico. 5. Con un pao limpio y hmedo diferente, enjuguese bien todo el cuerpo. Asegrese de enjuagar bien todos los pliegues y hendiduras de la piel. 6. Seque con una toalla limpia. Despus no aplique ninguna sustancia en el cuerpo, como polvo, locin o perfume, excepto que se lo indique el mdico. Solo use las lociones recomendadas por el fabricante. 7. Pngase pijama o ropa limpia. 8. Si es la noche anterior a la ciruga, duerma en sbanas limpias. [employer] paos preenvasados de CHG  Use los paos de CHG nicamente como se lo haya indicado el mdico y siga las instrucciones de la  etiqueta.  Use los paos de CHG sobre la piel limpia y [place].  No use el pao de CHG en la cabeza o el rostro a menos que el mdico se lo  indique.  Cuando lave con el pao de CHG: ? Evite la zona genital. ? Evite las zonas de la piel que estn lastimadas o tengan cortes o raspaduras. Antes de la ciruga Siga estos pasos al usar un pao de CHG para limpiar antes de una ciruga (a menos que su mdico le brinde instrucciones diferentes): 1. Con el pao de CHG, frtese con firmeza la parte del cuerpo donde le realizarn la ciruga. Frtese de un lado al otro durante 3 minutos. El rea del cuerpo debe estar completamente hmeda con CHG cuando termine de frotar. 2. No enjuague. Deseche el pao y deje que el rea se seque al aire. Despus no aplique ninguna sustancia sobre el rea, como polvos, lociones o perfume. 3. Pngase pijama o ropa limpia. 4. Si es la noche anterior a la ciruga, duerma en sbanas limpias.  Para baarse en general Siga estos pasos al usar el pao de CHG para baarse en general (a menos que su mdico le brinde instrucciones diferentes). 1. Use un pao de CHG diferente en cada rea del cuerpo. Asegrese de [employer] de la piel y [place] dedos de las manos y de los pies. Lvese el cuerpo en el siguiente orden, usando un pao nuevo despus de cada paso: ? La parte delantera del cuello, los hombros y [place]. ? Ambos brazos, debajo de los brazos y [place]. ? El estmago y la zona de la ingle, evitando los genitales. ? La pierna y el pie derechos. ? La pierna y el pie izquierdos. ? La parte posterior del cuello, la espalda y las nalgas. 2. No enjuague. Deseche el pao y deje que el rea se seque al aire. Despus no aplique ninguna sustancia en el cuerpo, como polvo, locin o perfume, excepto que se lo indique el mdico. Solo use las lociones recomendadas por el fabricante. 3. Pngase pijama o ropa limpia. Comunquese con un mdico si:  La piel se irrita  despus de frotarla.  Tiene preguntas sobre cmo usar la solucin o el pao. Solicite ayuda inmediatamente si:  Los ojos se ponen muy rojos o Cytogeneticist.  Siente picazn intensa en los ojos.  Siente picazn intensa en la piel y est roja o hinchada.  Nota cambios en su audicin.  Tiene dificultad para ver.  Tiene hinchazn u hormigueo en la garganta o la boca.  Tiene dificultad para respirar.  Traga un poco de clorhexidina. Resumen  El gluconato de clorhexidina (CHG) es una solucin desinfectante (antisptica) que se utiliza para limpiar la piel. Limpiar la piel con CHG puede ayudar a disminuir el riesgo de infeccin.  Es posible que le indiquen que utilice CHG para baarse. Esta solucin puede venir en botella o en un pao preenvasado para que use sobre su piel. Siga cuidadosamente las instrucciones del mdico y las instrucciones que se encuentran en la etiqueta del producto.  No use CHG si es alrgico a la clorhexidina.  Pngase en contacto con su mdico si se le irrita la piel luego de frotar. Esta informacin no tiene Marine scientist el consejo del mdico. Asegrese de hacerle al mdico cualquier pregunta que tenga. Document Revised: 11/26/2018 Document Reviewed: 09/08/2017 Elsevier Patient Education  [place].

## 2019-10-01 ENCOUNTER — Ambulatory Visit (HOSPITAL_COMMUNITY): Payer: Self-pay | Admitting: Anesthesiology

## 2019-10-01 ENCOUNTER — Encounter (HOSPITAL_COMMUNITY): Payer: Self-pay | Admitting: General Surgery

## 2019-10-01 ENCOUNTER — Ambulatory Visit (HOSPITAL_COMMUNITY)
Admission: RE | Admit: 2019-10-01 | Discharge: 2019-10-01 | Disposition: A | Discharge status: Home / Self Care | Payer: Self-pay | Attending: General Surgery | Admitting: General Surgery

## 2019-10-01 ENCOUNTER — Encounter (HOSPITAL_COMMUNITY)
Admission: RE | Disposition: A | Discharge status: Home / Self Care | Payer: Self-pay | Source: Home / Self Care | Attending: General Surgery

## 2019-10-01 DIAGNOSIS — K648 Other hemorrhoids: Secondary | ICD-10-CM

## 2019-10-01 DIAGNOSIS — Z8616 Personal history of COVID-19: Secondary | ICD-10-CM | POA: Insufficient documentation

## 2019-10-01 DIAGNOSIS — K644 Residual hemorrhoidal skin tags: Secondary | ICD-10-CM

## 2019-10-01 DIAGNOSIS — E119 Type 2 diabetes mellitus without complications: Secondary | ICD-10-CM | POA: Insufficient documentation

## 2019-10-01 DIAGNOSIS — Z79899 Other long term (current) drug therapy: Secondary | ICD-10-CM | POA: Insufficient documentation

## 2019-10-01 DIAGNOSIS — K219 Gastro-esophageal reflux disease without esophagitis: Secondary | ICD-10-CM | POA: Insufficient documentation

## 2019-10-01 DIAGNOSIS — Z87891 Personal history of nicotine dependence: Secondary | ICD-10-CM | POA: Insufficient documentation

## 2019-10-01 DIAGNOSIS — Z7984 Long term (current) use of oral hypoglycemic drugs: Secondary | ICD-10-CM | POA: Insufficient documentation

## 2019-10-01 HISTORY — PX: HEMORRHOID SURGERY: SHX153

## 2019-10-01 LAB — GLUCOSE, CAPILLARY: Glucose-Capillary: 110 mg/dL — ABNORMAL HIGH (ref 70–99)

## 2019-10-01 SURGERY — HEMORRHOIDECTOMY
Anesthesia: General | Site: Rectum

## 2019-10-01 MED ORDER — LACTATED RINGERS IV SOLN
Freq: Once | INTRAVENOUS | Status: AC
  Administered 2019-10-01: 1000 mL via INTRAVENOUS

## 2019-10-01 MED ORDER — GLYCOPYRROLATE 0.2 MG/ML IJ SOLN
INTRAMUSCULAR | Status: DC | PRN
  Administered 2019-10-01: .2 mg via INTRAVENOUS

## 2019-10-01 MED ORDER — HYDROMORPHONE HCL 1 MG/ML IJ SOLN
0.2500 mg | INTRAMUSCULAR | Status: DC | PRN
  Administered 2019-10-01 (×2): 0.5 mg via INTRAVENOUS
  Filled 2019-10-01 (×2): qty 0.5

## 2019-10-01 MED ORDER — MIDAZOLAM HCL 2 MG/2ML IJ SOLN
INTRAMUSCULAR | Status: AC
  Filled 2019-10-01: qty 2

## 2019-10-01 MED ORDER — HYDROCODONE-ACETAMINOPHEN 5-325 MG PO TABS
1.0000 | ORAL_TABLET | Freq: Once | ORAL | Status: AC
  Administered 2019-10-01: 1 via ORAL

## 2019-10-01 MED ORDER — FENTANYL CITRATE (PF) 100 MCG/2ML IJ SOLN
INTRAMUSCULAR | Status: DC | PRN
  Administered 2019-10-01 (×3): 50 ug via INTRAVENOUS

## 2019-10-01 MED ORDER — HYDROCODONE-ACETAMINOPHEN 5-325 MG PO TABS: 1.0000 | ORAL_TABLET | ORAL | 0 refills | Status: DC | PRN

## 2019-10-01 MED ORDER — PROPOFOL 10 MG/ML IV BOLUS
INTRAVENOUS | Status: DC | PRN
  Administered 2019-10-01: 200 mg via INTRAVENOUS

## 2019-10-01 MED ORDER — MEPERIDINE HCL 50 MG/ML IJ SOLN: 6.2500 mg | INTRAMUSCULAR | Status: DC | PRN

## 2019-10-01 MED ORDER — ONDANSETRON HCL 4 MG/2ML IJ SOLN
INTRAMUSCULAR | Status: DC | PRN
  Administered 2019-10-01: 4 mg via INTRAVENOUS

## 2019-10-01 MED ORDER — PROPOFOL 10 MG/ML IV BOLUS
INTRAVENOUS | Status: AC
  Filled 2019-10-01: qty 20

## 2019-10-01 MED ORDER — CHLORHEXIDINE GLUCONATE CLOTH 2 % EX PADS: 6.0000 | MEDICATED_PAD | Freq: Once | CUTANEOUS | Status: DC

## 2019-10-01 MED ORDER — MIDAZOLAM HCL 5 MG/5ML IJ SOLN
INTRAMUSCULAR | Status: DC | PRN
  Administered 2019-10-01: 1 mg via INTRAVENOUS

## 2019-10-01 MED ORDER — LIDOCAINE VISCOUS HCL 2 % MT SOLN
OROMUCOSAL | Status: DC | PRN
  Administered 2019-10-01: 1

## 2019-10-01 MED ORDER — FENTANYL CITRATE (PF) 250 MCG/5ML IJ SOLN
INTRAMUSCULAR | Status: AC
  Filled 2019-10-01: qty 5

## 2019-10-01 MED ORDER — LIDOCAINE VISCOUS HCL 2 % MT SOLN
OROMUCOSAL | Status: AC
  Filled 2019-10-01: qty 15

## 2019-10-01 MED ORDER — ONDANSETRON HCL 4 MG/2ML IJ SOLN: 4.0000 mg | Freq: Once | INTRAMUSCULAR | Status: DC | PRN

## 2019-10-01 MED ORDER — HYDROCODONE-ACETAMINOPHEN 5-325 MG PO TABS
ORAL_TABLET | ORAL | Status: AC
  Filled 2019-10-01: qty 1

## 2019-10-01 MED ORDER — SODIUM CHLORIDE 0.9 % IR SOLN
Status: DC | PRN
  Administered 2019-10-01: 1000 mL

## 2019-10-01 MED ORDER — KETOROLAC TROMETHAMINE 30 MG/ML IJ SOLN
30.0000 mg | Freq: Once | INTRAMUSCULAR | Status: AC
  Administered 2019-10-01: 11:00:00 30 mg via INTRAVENOUS
  Filled 2019-10-01: qty 1

## 2019-10-01 MED ORDER — BUPIVACAINE LIPOSOME 1.3 % IJ SUSP
INTRAMUSCULAR | Status: DC | PRN
  Administered 2019-10-01: 14 mL

## 2019-10-01 MED ORDER — MIDAZOLAM HCL 2 MG/2ML IJ SOLN
2.0000 mg | Freq: Once | INTRAMUSCULAR | Status: AC
  Administered 2019-10-01: 2 mg via INTRAVENOUS

## 2019-10-01 MED ORDER — METRONIDAZOLE IN NACL 5-0.79 MG/ML-% IV SOLN
500.0000 mg | INTRAVENOUS | Status: AC
  Administered 2019-10-01: 500 mg via INTRAVENOUS
  Filled 2019-10-01: qty 100

## 2019-10-01 MED ORDER — HYDROCODONE-ACETAMINOPHEN 5-325 MG PO TABS: 1.0000 | ORAL_TABLET | Freq: Four times a day (QID) | ORAL | 0 refills | Status: DC | PRN

## 2019-10-01 MED ORDER — BUPIVACAINE LIPOSOME 1.3 % IJ SUSP
INTRAMUSCULAR | Status: AC
  Filled 2019-10-01: qty 20

## 2019-10-01 SURGICAL SUPPLY — 25 items
CLOTH BEACON ORANGE TIMEOUT ST (SAFETY) ×3 IMPLANT
COVER LIGHT HANDLE STERIS (MISCELLANEOUS) ×6 IMPLANT
COVER WAND RF STERILE (DRAPES) ×3 IMPLANT
DRAPE HALF SHEET 40X57 (DRAPES) ×3 IMPLANT
ELECT REM PT RETURN 9FT ADLT (ELECTROSURGICAL) ×3
ELECTRODE REM PT RTRN 9FT ADLT (ELECTROSURGICAL) ×1 IMPLANT
GAUZE SPONGE 4X4 12PLY STRL (GAUZE/BANDAGES/DRESSINGS) ×3 IMPLANT
GLOVE BIOGEL PI IND STRL 7.0 (GLOVE) ×2 IMPLANT
GLOVE BIOGEL PI INDICATOR 7.0 (GLOVE) ×4
GLOVE ECLIPSE 6.5 STRL STRAW (GLOVE) ×3 IMPLANT
GLOVE SURG SS PI 7.5 STRL IVOR (GLOVE) ×3 IMPLANT
GOWN STRL REUS W/TWL LRG LVL3 (GOWN DISPOSABLE) ×6 IMPLANT
HEMOSTAT SURGICEL 4X8 (HEMOSTASIS) ×3 IMPLANT
KIT TURNOVER CYSTO (KITS) ×3 IMPLANT
MANIFOLD NEPTUNE II (INSTRUMENTS) ×3 IMPLANT
NEEDLE HYPO 18GX1.5 BLUNT FILL (NEEDLE) ×3 IMPLANT
NEEDLE HYPO 22GX1.5 SAFETY (NEEDLE) ×3 IMPLANT
NS IRRIG 1000ML POUR BTL (IV SOLUTION) ×3 IMPLANT
PACK PERI GYN (CUSTOM PROCEDURE TRAY) ×3 IMPLANT
PAD ARMBOARD 7.5X6 YLW CONV (MISCELLANEOUS) ×3 IMPLANT
SET BASIN LINEN APH (SET/KITS/TRAYS/PACK) ×3 IMPLANT
SURGILUBE 2OZ TUBE FLIPTOP (MISCELLANEOUS) ×3 IMPLANT
SUT SILK 0 FSL (SUTURE) ×3 IMPLANT
SUT VIC AB 2-0 CT2 27 (SUTURE) ×3 IMPLANT
SYR 20ML LL LF (SYRINGE) ×6 IMPLANT

## 2019-10-01 NOTE — Anesthesia Postprocedure Evaluation (Signed)
Anesthesia Post Note  Patient: Ricardo Ward  Procedure(s) Performed: HEMORRHOIDECTOMY EXTENSIVE (N/A Rectum)  Patient location during evaluation: PACU Anesthesia Type: General Level of consciousness: awake and alert Pain management: pain level controlled Vital Signs Assessment: post-procedure vital signs reviewed and stable Respiratory status: spontaneous breathing Cardiovascular status: stable Postop Assessment: no apparent nausea or vomiting Anesthetic complications: no     Last Vitals:  Vitals:   10/01/19 0902 10/01/19 1045  BP: 123/75 115/85  Pulse: 61 78  Resp: 20 11  Temp: 36.9 C   SpO2: 97% 99%    Last Pain:  Vitals:   10/01/19 0902  TempSrc: Oral  PainSc: 0-No pain                 Sunita Demond Hristova

## 2019-10-01 NOTE — Progress Notes (Signed)
Procedure: Hemorrhoidectomy  Rectal Tampon removed at [id number] without incident, intact on removal. Pt tolerated well.

## 2019-10-01 NOTE — Anesthesia Procedure Notes (Signed)
Procedure Name: LMA Insertion Date/Time: 10/01/2019 10:00 AM Performed by: Hewitt Blade, CRNA Pre-anesthesia Checklist: Patient identified, Emergency Drugs available, Suction available and Patient being monitored Patient Re-evaluated:Patient Re-evaluated prior to induction Oxygen Delivery Method: Circle system utilized Preoxygenation: Pre-oxygenation with 100% oxygen Induction Type: IV induction Ventilation: Mask ventilation without difficulty LMA: LMA inserted LMA Size: 5.0 Number of attempts: 1 Placement Confirmation: positive ETCO2 and breath sounds checked- equal and bilateral Tube secured with: Tape Dental Injury: Teeth and Oropharynx as per pre-operative assessment

## 2019-10-01 NOTE — Interval H&P Note (Signed)
History and Physical Interval Note:  10/01/2019 9:27 AM  Ricardo Ward  has presented today for surgery, with the diagnosis of BLEEDING HEMORRHOIDS.  The various methods of treatment have been discussed with the patient and family. After consideration of risks, benefits and other options for treatment, the patient has consented to  Procedure(s): HEMORRHOIDECTOMY EXTENSIVE (N/A) as a surgical intervention.  The patient's history has been reviewed, patient examined, no change in status, stable for surgery.  I have reviewed the patient's chart and labs.  Questions were answered to the patient's satisfaction.     Aviva Signs

## 2019-10-01 NOTE — Anesthesia Preprocedure Evaluation (Signed)
Anesthesia Evaluation  Patient identified by MRN, date of birth, ID band Patient awake    Reviewed: Allergy & Precautions, NPO status , Patient's Chart, lab work & pertinent test results  History of Anesthesia Complications Negative for: history of anesthetic complications  Airway Mallampati: II  TM Distance: >3 FB Neck ROM: Full    Dental no notable dental hx. (+) Teeth Intact   Pulmonary former smoker,    breath sounds clear to auscultation       Cardiovascular Exercise Tolerance: Good  Rhythm:Regular Rate:Normal     Neuro/Psych negative neurological ROS  negative psych ROS   GI/Hepatic Neg liver ROS, GERD  Medicated,  Endo/Other  diabetes (FSBS - 110), Well Controlled, Type 2, Oral Hypoglycemic Agents  Renal/GU negative Renal ROS     Musculoskeletal negative musculoskeletal ROS (+)   Abdominal   Peds  Hematology  (+) anemia ,   Anesthesia Other Findings   Reproductive/Obstetrics negative OB ROS                             Anesthesia Physical Anesthesia Plan  ASA: II  Anesthesia Plan: General   Post-op Pain Management:    Induction: Intravenous  PONV Risk Score and Plan: 3 and Midazolam, Ondansetron and Treatment may vary due to age or medical condition  Airway Management Planned: LMA  Additional Equipment:   Intra-op Plan:   Post-operative Plan: Extubation in OR  Informed Consent: I have reviewed the patients History and Physical, chart, labs and discussed the procedure including the risks, benefits and alternatives for the proposed anesthesia with the patient or authorized representative who has indicated his/her understanding and acceptance.     Dental advisory given  Plan Discussed with: CRNA and Surgeon  Anesthesia Plan Comments:         Anesthesia Quick Evaluation

## 2019-10-01 NOTE — Op Note (Signed)
Patient:  Ricardo Ward  DOB:  02/10/1971  MRN:  [id number]   Preop Diagnosis: Bleeding hemorrhoidal disease  Postop Diagnosis: Same  Procedure: Internal and external hemorrhoidectomy, multiple  Surgeon: Aviva Signs, MD  Anes: General  Indications: Patient is a 50 year old Hispanic male who presents with bleeding hemorrhoidal disease.  The risks and benefits of the procedure including bleeding, infection, and recurrence of the hemorrhoidal disease were fully explained to the patient, who gave informed consent.  Procedure note: The patient was placed in the lithotomy position after general anesthesia was administered.  The perineum was prepped and draped using usual sterile technique with Betadine.  Surgical site confirmation was performed.  On anoscopy, the patient was noted to have significant internal extending to the external hemorrhoids at the 4:00 and 7:00 positions with evidence of superficial mucosal prolapse.  He also had a large internal hemorrhoid at the 10 o'clock position.  The external sphincter was identified.  Tone appeared to be within normal limits.  No other mass lesions noted.  All the hemorrhoids were excised in a collar-like fashion using the LigaSure.  At the 4 o'clock position, some bleeding was noted after excision of the internal hemorrhoid and a figure-of-eight 2-0 Vicryl suture was placed.  No bleeding was noted at the end of the procedure.  The external sphincter was noted to be intact.  Exparel was instilled into the surrounding perineum.  Surgicel and Viscous Xylocaine rectal packing was then placed.  All tape and needle counts were correct at the end of the procedure.  The patient was awakened and transferred to PACU in stable condition.  Complications: None  EBL: Minimal  Specimen: Hemorrhoids

## 2019-10-01 NOTE — Transfer of Care (Signed)
Immediate Anesthesia Transfer of Care Note  Patient: Ricardo Ward  Procedure(s) Performed: HEMORRHOIDECTOMY EXTENSIVE (N/A Rectum)  Patient Location: PACU  Anesthesia Type:General  Level of Consciousness: awake  Airway & Oxygen Therapy: Patient Spontanous Breathing and Patient connected to face mask oxygen  Post-op Assessment: Report given to RN and Post -op Vital signs reviewed and stable  Post vital signs: Reviewed and stable  Last Vitals:  Vitals Value Taken Time  BP 115/85 10/01/19 1046  Temp    Pulse 67 10/01/19 1053  Resp 11 10/01/19 1053  SpO2 100 % 10/01/19 1053  Vitals shown include unvalidated device data.  Last Pain:  Vitals:   10/01/19 0902  TempSrc: Oral  PainSc: 0-No pain      Patients Stated Pain Goal: 8 ([id number])  Complications: No apparent anesthesia complications

## 2019-10-02 LAB — SURGICAL PATHOLOGY

## 2019-10-02 NOTE — Discharge Instructions (Signed)
Hemorrhoids Hemorrhoids are swollen veins in and around the rectum or anus. There are two types of hemorrhoids:  Internal hemorrhoids. These occur in the veins that are just inside the rectum. They may poke through to the outside and become irritated and painful.  External hemorrhoids. These occur in the veins that are outside the anus and can be felt as a painful swelling or hard lump near the anus. Most hemorrhoids do not cause serious problems, and they can be managed with home treatments such as diet and lifestyle changes. If home treatments do not help the symptoms, procedures can be done to shrink or remove the hemorrhoids. What are the causes? This condition is caused by increased pressure in the anal area. This pressure may result from various things, including:  Constipation.  Straining to have a bowel movement.  Diarrhea.  Pregnancy.  Obesity.  Sitting for long periods of time.  Heavy lifting or other activity that causes you to strain.  Anal sex.  Riding a bike for a long period of time. What are the signs or symptoms? Symptoms of this condition include:  Pain.  Anal itching or irritation.  Rectal bleeding.  Leakage of stool (feces).  Anal swelling.  One or more lumps around the anus. How is this diagnosed? This condition can often be diagnosed through a visual exam. Other exams or tests may also be done, such as:  An exam that involves feeling the rectal area with a gloved hand (digital rectal exam).  An exam of the anal canal that is done using a small tube (anoscope).  A blood test, if you have lost a significant amount of blood.  A test to look inside the colon using a flexible tube with a camera on the end (sigmoidoscopy or colonoscopy). How is this treated? This condition can usually be treated at home. However, various procedures may be done if dietary changes, lifestyle changes, and other home treatments do not help your symptoms. These  procedures can help make the hemorrhoids smaller or remove them completely. Some of these procedures involve surgery, and others do not. Common procedures include:  Rubber band ligation. Rubber bands are placed at the base of the hemorrhoids to cut off their blood supply.  Sclerotherapy. Medicine is injected into the hemorrhoids to shrink them.  Infrared coagulation. A type of light energy is used to get rid of the hemorrhoids.  Hemorrhoidectomy surgery. The hemorrhoids are surgically removed, and the veins that supply them are tied off.  Stapled hemorrhoidopexy surgery. The surgeon staples the base of the hemorrhoid to the rectal wall. Follow these instructions at home: Eating and drinking   Eat foods that have a lot of fiber in them, such as whole grains, beans, nuts, fruits, and vegetables.  Ask your health care provider about taking products that have added fiber (fiber supplements).  Reduce the amount of fat in your diet. You can do this by eating low-fat dairy products, eating less red meat, and avoiding processed foods.  Drink enough fluid to keep your urine pale yellow. Managing pain and swelling   Take warm sitz baths for 20 minutes, 3-4 times a day to ease pain and discomfort. You may do this in a bathtub or using a portable sitz bath that fits over the toilet.  If directed, apply ice to the affected area. Using ice packs between sitz baths may be helpful. ? Put ice in a plastic bag. ? Place a towel between your skin and the bag. ? Leave   the ice on for 20 minutes, 2-3 times a day. General instructions  Take over-the-counter and prescription medicines only as told by your health care provider.  Use medicated creams or suppositories as told.  Get regular exercise. Ask your health care provider how much and what kind of exercise is best for you. In general, you should do moderate exercise for at least 30 minutes on most days of the week (150 minutes each week). This can  include activities such as walking, biking, or yoga.  Go to the bathroom when you have the urge to have a bowel movement. Do not wait.  Avoid straining to have bowel movements.  Keep the anal area dry and clean. Use wet toilet paper or moist towelettes after a bowel movement.  Do not sit on the toilet for long periods of time. This increases blood pooling and pain.  Keep all follow-up visits as told by your health care provider. This is important. Contact a health care provider if you have:  Increasing pain and swelling that are not controlled by treatment or medicine.  Difficulty having a bowel movement, or you are unable to have a bowel movement.  Pain or inflammation outside the area of the hemorrhoids. Get help right away if you have:  Uncontrolled bleeding from your rectum. Summary  Hemorrhoids are swollen veins in and around the rectum or anus.  Most hemorrhoids can be managed with home treatments such as diet and lifestyle changes.  Taking warm sitz baths can help ease pain and discomfort.  In severe cases, procedures or surgery can be done to shrink or remove the hemorrhoids. This information is not intended to replace advice given to you by your health care provider. Make sure you discuss any questions you have with your health care provider. Document Revised: 01/03/2019 Document Reviewed: 12/27/2017 Elsevier Patient Education  [place] para las hemorroides, [place] Surgical Procedures for Hemorrhoids, Care After [employer] brinda informacin sobre cmo cuidarse despus del procedimiento. El mdico tambin podr darle instrucciones ms especficas. Comunquese con el mdico si tiene problemas o preguntas. Qu puedo esperar despus del procedimiento? Despus del procedimiento, es comn [employer] siguientes sntomas:  Dolor rectal.  Dolor al defecar.  Sangrado rectal leve. Es ms probable que esto suceda con la  primera deposicin despus de la ciruga. Siga estas instrucciones en su casa: Medicamentos  Tome los medicamentos de venta libre y los recetados solamente como se lo haya indicado el mdico.  Si le recetaron un antibitico, selo como se lo haya indicado el mdico. No deje de usar el antibitico aunque la afeccin mejore.  Pregntele al mdico si el medicamento recetado le impide conducir o usar maquinaria pesada.  Tome un laxante emoliente o un laxante formador de [employer] como se lo haya indicado el mdico. Comida y bebida  Siga las instrucciones del mdico respecto de qu comer o beber despus del procedimiento.  Es posible que deba tomar medidas para prevenir o Recruitment consultant, por ejemplo: ? Beber suficiente lquido como para mantener la orina de color amarillo plido. ? Tomar medicamentos recetados o de [employer]. ? Consumir alimentos ricos en fibra, como frijoles, cereales integrales, y frutas y verduras frescas. ? Limitar el consumo de alimentos ricos en grasa y azcares procesados, como los alimentos fritos o dulces. Actividad   Haga reposo como se lo haya indicado el mdico.  Evite estar sentado durante largos perodos sin moverse. Levntese y camine un poco cada 1 a  2 horas. Esto es importante para mejorar el flujo sanguneo y la respiracin. Pida ayuda si se siente dbil o inestable.  Retome sus actividades normales segn lo indicado por el mdico. Pregntele al mdico qu actividades son seguras para usted.  No levante ningn objeto que pese ms de 10libras (4.5kg) o que supere el lmite de peso que le hayan indicado, hasta que el mdico le diga que puede [place].  No haga fuerza para defecar.  No pase mucho tiempo sentado en el inodoro. Instrucciones generales   Tome baos de asiento tibios durante 15 a 20 minutos, 2 o 3veces por da, para Best boy o la picazn y para Theatre manager limpia el rea rectal.  Aplique bolsas de hielo en la zona para  reducir la hinchazn y Conservation officer, historic buildings.  No conduzca durante 24horas si le administraron un sedante durante el procedimiento.  Concurra a todas las visitas de seguimiento como se lo haya indicado el mdico. Esto es importante. Comunquese con un mdico si:  El medicamento no Production designer, theatre/television/film.  Tiene fiebre o escalofros.  Tiene secrecin con mal olor.  Tiene mucha hinchazn.  Tiene estreimiento.  Tiene dificultades para orinar. Solicite ayuda inmediatamente si:  Patent attorney.  Tiene hemorragia rectal abundante. Resumen  Despus del procedimiento, es habitual Patent attorney y un ligero sangrado rectal.  Tome baos de asiento tibios durante 15 a 20 minutos, 2 o 3veces por Training and development officer, para Best boy o la picazn y para Theatre manager limpia el rea rectal.  Evite hacer fuerza al defecar.  Consumir alimentos ricos en fibra, como frijoles, cereales integrales, y frutas y verduras frescas.  Tome los medicamentos de venta libre y los recetados solamente como se lo haya indicado el mdico. Esta informacin no tiene Marine scientist el consejo del mdico. Asegrese de hacerle al mdico cualquier pregunta que tenga. Document Revised: 08/27/2018 Document Reviewed: 08/27/2018 Elsevier Patient Education  [place].

## 2019-10-02 NOTE — Progress Notes (Signed)
Post op call made via Interpreter line [phone] 1st call patient's wife was concerned that he is hurting a lot before the 6 hours was time  for next dose and he has not had a bowl movement.  Dr Arnoldo Morale notified.  2nd call interpreter line [phone] He instructed me to tell his wife it is OK to take pain medicine ever 4 hours for today and take milk of magnesia for his constipation.  He will be in the office until 12 today.  Call him at office if needed.  New number given  [phone]

## 2019-10-03 NOTE — Progress Notes (Signed)
Post op Day 2:  Follow-up Post op call Attempted with interpreter line [phone].  No answer.  Left instructions to call Dr Arnoldo Morale if needed or come to emergency room if pain uncontrolled

## 2019-10-08 ENCOUNTER — Telehealth (INDEPENDENT_AMBULATORY_CARE_PROVIDER_SITE_OTHER): Payer: Self-pay | Admitting: General Surgery

## 2019-10-08 DIAGNOSIS — Z09 Encounter for follow-up examination after completed treatment for conditions other than malignant neoplasm: Secondary | ICD-10-CM

## 2019-10-08 MED ORDER — OXYCODONE-ACETAMINOPHEN 7.5-325 MG PO TABS: 1.0000 | ORAL_TABLET | Freq: Four times a day (QID) | ORAL | 0 refills | Status: DC | PRN

## 2019-10-08 NOTE — Telephone Encounter (Signed)
Patient called office.  Still having moderate rectal pain from surgery.  No active bleeding.  Bowel movements three times a day.  Some difficulty voiding, but this is getting better.  Percocet for pain prescribed.  Will call office next Tuesday for follow up.

## 2019-12-02 ENCOUNTER — Ambulatory Visit: Payer: Self-pay | Attending: Internal Medicine | Admitting: Internal Medicine

## 2019-12-02 ENCOUNTER — Other Ambulatory Visit: Payer: Self-pay

## 2019-12-02 ENCOUNTER — Ambulatory Visit: Payer: Self-pay | Admitting: Internal Medicine

## 2019-12-16 ENCOUNTER — Ambulatory Visit: Payer: Self-pay | Attending: Internal Medicine | Admitting: Internal Medicine

## 2019-12-16 ENCOUNTER — Other Ambulatory Visit: Payer: Self-pay

## 2019-12-16 DIAGNOSIS — K209 Esophagitis, unspecified without bleeding: Secondary | ICD-10-CM

## 2019-12-16 DIAGNOSIS — Z8719 Personal history of other diseases of the digestive system: Secondary | ICD-10-CM

## 2019-12-16 DIAGNOSIS — E1169 Type 2 diabetes mellitus with other specified complication: Secondary | ICD-10-CM

## 2019-12-16 DIAGNOSIS — Z9889 Other specified postprocedural states: Secondary | ICD-10-CM

## 2019-12-16 DIAGNOSIS — D509 Iron deficiency anemia, unspecified: Secondary | ICD-10-CM

## 2019-12-16 DIAGNOSIS — E785 Hyperlipidemia, unspecified: Secondary | ICD-10-CM

## 2019-12-16 DIAGNOSIS — E119 Type 2 diabetes mellitus without complications: Secondary | ICD-10-CM

## 2019-12-16 MED ORDER — PANTOPRAZOLE SODIUM 40 MG PO TBEC: 40.0000 mg | DELAYED_RELEASE_TABLET | Freq: Two times a day (BID) | ORAL | 3 refills | Status: DC

## 2019-12-16 MED ORDER — ATORVASTATIN CALCIUM 20 MG PO TABS: 20.0000 mg | ORAL_TABLET | Freq: Every day | ORAL | 3 refills | Status: DC

## 2019-12-16 MED ORDER — METFORMIN HCL 500 MG PO TABS: 500.0000 mg | ORAL_TABLET | Freq: Every day | ORAL | 4 refills | Status: DC

## 2019-12-16 NOTE — Progress Notes (Signed)
Virtual Visit via Telephone Note Due to current restrictions/limitations of in-office visits due to the COVID-19 pandemic, this scheduled clinical appointment was converted to a telehealth visit  I connected with Ricardo Ward on 12/16/19 at 4:14 p.m by telephone and verified that I am speaking with the correct person using two identifiers. I am in my office.  The patient is at home.  Only the patient, myself and Fabio Bering from [employer] [phone]) participated in this encounter.  I discussed the limitations, risks, security and privacy concerns of performing an evaluation and management service by telephone and the availability of in person appointments. I also discussed with the patient that there may be a patient responsible charge related to this service. The patient expressed understanding and agreed to proceed.   History of Present Illness: IDA, DM, HL, hx COVID infection.  Patient last seen 07/2019.  Purpose of today's visit is chronic disease management.  IDA: since last visit, he saw Dr. Oneida Alar and had EGD and c-scope.  Colonoscopy revealed moderate internal hemorrhoids and 1 5 mm polyp was removed.  EGD revealed esophagitis and pathology of stomach biopsy showed chronic gastritis, H. pylori negative.  Patient was started on PPI and referred to general surgeon Dr. Arnoldo Morale for hemorrhoidectomy. Referred to surgeon Dr. Arnoldo Morale who did hemorrhoidectomy of internal and external hemorrhoids on 10/01/19.  Rectal bleeding stopped.  He stopped iron supplement.  DM:  Not checking BS but does have meter. Reports compliance with Metformin 500 mg but only taking once a day not BID as prescribed.  Not aware that it was to be BID dosing Had eye exam done yesterday and given rxn for eye glasses.  Reports he has  had 4 surgeries on eye in past.   HL:  Reports compliance with Atorvastatin.  Needs refilled  HM: had [place] vaccine series but does not have his card handy to give me the dates  so that I can update his immunization record.   Outpatient Encounter Medications as of 12/16/2019  Medication Sig Note  . atorvastatin (LIPITOR) 20 MG tablet Take 1 tablet (20 mg total) by mouth daily.   . blood glucose meter kit and supplies Dispense based on patient and insurance preference. Use up to four times daily as directed. (FOR ICD-10 E10.9, E11.9).   . ferrous sulfate 325 (65 FE) MG tablet Take 1 tablet (325 mg total) by mouth daily with breakfast. 08/28/2019: On hold for procedure  . HYDROcodone-acetaminophen (NORCO) 5-325 MG tablet Take 1 tablet by mouth every 6 (six) hours as needed for moderate pain. (Patient not taking: Reported on 12/16/2019)   . metFORMIN (GLUCOPHAGE) 500 MG tablet Take 1 tablet (500 mg total) by mouth 2 (two) times daily with a meal. (Patient not taking: Reported on 12/16/2019)   . Multiple Vitamin (MULTIVITAMIN WITH MINERALS) TABS tablet Take 1 tablet by mouth daily.   Marland Kitchen oxyCODONE-acetaminophen (PERCOCET) 7.5-325 MG tablet Take 1 tablet by mouth every 6 (six) hours as needed for severe pain. (Patient not taking: Reported on 12/16/2019)   . pantoprazole (PROTONIX) 40 MG tablet 1 PO 30 MINUTES PRIOR TO MEALS BID FOR 3 MOS THEN QD (Patient taking differently: Take 40 mg by mouth 2 (two) times daily. )    No facility-administered encounter medications on file as of 12/16/2019.      Observations/Objective:   Chemistry      Component Value Date/Time   NA 137 01/29/2019 0230   NA 138 12/28/2016 1623   K 3.8 01/29/2019 0230  CL 105 01/29/2019 0230   CO2 23 01/29/2019 0230   BUN 16 01/29/2019 0230   BUN 14 12/28/2016 1623   CREATININE 0.74 01/29/2019 0230      Component Value Date/Time   CALCIUM 8.4 (L) 01/29/2019 0230   ALKPHOS 78 05/02/2019 1225   AST 23 05/02/2019 1225   ALT 21 05/02/2019 1225   BILITOT 0.2 05/02/2019 1225     Lab Results  Component Value Date   WBC 5.7 08/21/2019   HGB 13.8 08/21/2019   HCT 41.5 08/21/2019   MCV 91.8 08/21/2019    PLT 321 08/21/2019     Assessment and Plan: 1. Controlled type 2 diabetes mellitus without complication, without long-term current use of insulin ([place]) Encourage healthy eating habits and regular exercise.  I have updated Metformin to reflect the 500 mg once a day instead of twice daily - Hemoglobin A1c; Future - metFORMIN (GLUCOPHAGE) 500 MG tablet; Take 1 tablet (500 mg total) by mouth daily with breakfast.  Dispense: 30 tablet; Refill: 4  2. Hyperlipidemia associated with type 2 diabetes mellitus (HCC) - atorvastatin (LIPITOR) 20 MG tablet; Take 1 tablet (20 mg total) by mouth daily.  Dispense: 90 tablet; Refill: 3  3. Iron deficiency anemia, unspecified iron deficiency anemia type - CBC; Future  4. S/P hemorrhoidectomy   5. Esophagitis - pantoprazole (PROTONIX) 40 MG tablet; Take 1 tablet (40 mg total) by mouth 2 (two) times daily.  Dispense: 60 tablet; Refill: 3   Follow Up Instructions: 4 mths   I discussed the assessment and treatment plan with the patient. The patient was provided an opportunity to ask questions and all were answered. The patient agreed with the plan and demonstrated an understanding of the instructions.   The patient was advised to call back or seek an in-person evaluation if the symptoms worsen or if the condition fails to improve as anticipated.  I provided 21 minutes of non-face-to-face time during this encounter.   Karle Plumber, MD

## 2019-12-17 MED FILL — ?ATORVASTATIN 20 MG TABLET: 20 | 30 days supply | Qty: 30 | Fill #0

## 2019-12-17 MED FILL — METFORMIN HCL 500 MG TABS: 500 | 30 days supply | Qty: 30 | Fill #0

## 2019-12-17 MED FILL — ?PANTOPRAZOLE SO DR 40MG TA: 40 | 30 days supply | Qty: 60 | Fill #0

## 2019-12-18 ENCOUNTER — Ambulatory Visit (INDEPENDENT_AMBULATORY_CARE_PROVIDER_SITE_OTHER): Payer: Self-pay | Admitting: Nurse Practitioner

## 2019-12-18 ENCOUNTER — Other Ambulatory Visit: Payer: Self-pay

## 2019-12-18 ENCOUNTER — Encounter: Payer: Self-pay | Admitting: Nurse Practitioner

## 2019-12-18 VITALS — BP 145/78 | HR 66 | Temp 97.1°F | Ht 71.0 in | Wt 203.4 lb

## 2019-12-18 DIAGNOSIS — K5900 Constipation, unspecified: Secondary | ICD-10-CM

## 2019-12-18 DIAGNOSIS — K648 Other hemorrhoids: Secondary | ICD-10-CM

## 2019-12-18 DIAGNOSIS — K644 Residual hemorrhoidal skin tags: Secondary | ICD-10-CM

## 2019-12-18 DIAGNOSIS — D509 Iron deficiency anemia, unspecified: Secondary | ICD-10-CM

## 2019-12-18 NOTE — Progress Notes (Signed)
Referring Provider: Ladell Pier, MD Primary Care Physician:  Ladell Pier, MD Primary GI:  Dr. Oneida Alar  Chief Complaint  Patient presents with  . Rectal Bleeding    had hemorrhoid removed in Feb from surgery, 4 weeks ago had some blood in stool, denies straining with BM but reports it did hurt coming out    HPI:   Ricardo Ward is a 49 y.o. male who presents for follow-up on IDA.  Patient was last seen in our office 08/21/2019 for rectal bleeding, IDA.  Review of CBCs prior to last visit found 6 months prior hemoglobin of 7.5 that improved to 12.75 months ago and 14.33 months prior.  Initially microcytic and hypochromic but corrected likely due to oral iron supplementation.  Previous ferritin on 01/28/2019 was 13 which is a decline from 16, 21.  Most recent iron completed 01/24/2019 which showed low iron at 11 and low saturation of 3%.  At his last visit he noted COVID-19 infection with hospital admission and high flow oxygen but no intubation.  Took 2 months to fully recover.  Noted rectal bleeding for the past 1 and half years that is recently gotten worse over the previous week.  Has tried suppositories and sits baths without improvement.  Hematochezia with every bowel movement and blood on the stool and in the commode.  Left lower quadrant abdominal pain, no GERD symptoms.  Takes NSAIDs intermittently but not often.  No other overt GI complaints.  Still on iron.  He did describe at the end of the visit symptoms it seemed like classic internal hemorrhoids with occasional protrusion, rectal pain/irritation status post trial of rectal suppository without relief.  Recommended Anusol rectal cream, colonoscopy and endoscopy, update labs, follow-up in 4 months.  Labs completed 08/21/2019 found normal hemoglobin at 13.8, iron improved at 37, saturation improved to 9%, ferritin improved to 23.  Colonoscopy completed 09/03/2019 which found a single 5 mm polyp, diverticulosis, rectal bleeding  due to internal hemorrhoids.  Surgical pathology found the polyp to be sessile serrated polyp and recommended repeat colonoscopy in 5 years (2026).  EGD the same day found LA grade B esophagitis without bleeding, low-grade narrowing Schatzki's ring, localized mild inflammation in the stomach status post biopsies.  Overall felt normocytic anemia due to erosive esophagitis, gastritis, and hemorrhoidal bleeding.  Surgical pathology found the biopsies to be chronic gastritis negative for H. pylori.  Recommended Protonix twice daily for 3 months and once daily forever, continue iron, Preparation H as needed, see surgery to fix hemorrhoids.  The patient underwent internal and external hemorrhoidectomy 10/01/2019.  Postoperative follow-up phone call 10/08/2019 noted persistent moderate rectal pain, no active bleeding, bowel movements 3 times a day.  Percocet was provided.  Today he is accompanied by a Publishing copy. Today he states he's doing well overall. Surgery went well, took a while for pain to resolve post-op (about 2-3 weeks). Currently pain has resolved. Did have hematochezia about a month ago with some mild constipation and straining; mild amount, none since then. He had PCP visit 2 days ago and has labs scheduled for today including CBC. Denies abdominal pain, N/V, melena, fever, chills, unintentional weight loss. Denies URI or flu-like symptoms. Denies loss of sense of taste or smell. He states he had both COVID-19 vaccines. Denies chest pain, dyspnea, dizziness, lightheadedness, syncope, near syncope. Denies any other upper or lower GI symptoms.  He did have some abdominal discomfort a couple days ago at the site of previous hernia  repair, but this has resolved.  AVS provider in [place] and [place], translator reviewed translation and agreed with it's accuracy in meaning.  Past Medical History:  Diagnosis Date  . COVID-19 12/2018   s/p hospitalization and recovery  . Diabetes ([place])   .  Pterygium    rt eye  . Pterygium of both eyes    removal    Past Surgical History:  Procedure Laterality Date  . APPENDECTOMY    . BACK SURGERY    . BIOPSY  09/03/2019   Procedure: BIOPSY;  Surgeon: Danie Binder, MD;  [facility];  Service: Endoscopy;;  gastric  . COLONOSCOPY N/A 09/03/2019   Procedure: COLONOSCOPY;  Surgeon: Danie Binder, MD;  [facility];  Service: Endoscopy;  Laterality: N/A;  3:00pm  . ESOPHAGOGASTRODUODENOSCOPY N/A 09/03/2019   Procedure: ESOPHAGOGASTRODUODENOSCOPY (EGD);  Surgeon: Danie Binder, MD;  [facility];  Service: Endoscopy;  Laterality: N/A;  . EYE SURGERY Bilateral 06   pterygium   . HEMORRHOID SURGERY N/A 10/01/2019   Procedure: HEMORRHOIDECTOMY EXTENSIVE;  Surgeon: Aviva Signs, MD;  [facility];  Service: General;  Laterality: N/A;  . POLYPECTOMY  09/03/2019   Procedure: POLYPECTOMY;  Surgeon: Danie Binder, MD;  [facility];  Service: Endoscopy;;  hepatic flexure  . PTERYGIUM EXCISION Bilateral   . PTERYGIUM EXCISION Right 11/12/2013   Procedure: PTERYGIUM EXCISION WITH MITOMYCIN C AND AMNIOTIC MEMBRANE USING TISSEEL GLUE RIGHT EYE;  Surgeon: Marylynn Pearson, MD;  Location: [place];  Service: Ophthalmology;  Laterality: Right;    Current Outpatient Medications  Medication Sig Dispense Refill  . atorvastatin (LIPITOR) 20 MG tablet Take 1 tablet (20 mg total) by mouth daily. 90 tablet 3  . blood glucose meter kit and supplies Dispense based on patient and insurance preference. Use up to four times daily as directed. (FOR ICD-10 E10.9, E11.9). 1 each 0  . metFORMIN (GLUCOPHAGE) 500 MG tablet Take 1 tablet (500 mg total) by mouth daily with breakfast. 30 tablet 4  . Multiple Vitamin (MULTIVITAMIN WITH MINERALS) TABS tablet Take 1 tablet by mouth daily.     No current facility-administered medications for this visit.    Allergies as of 12/18/2019 - Review Complete 12/18/2019  Allergen Reaction  Noted  . Squid oil Rash 08/28/2019    Family History  Problem Relation Age of Onset  . Alzheimer's disease Mother   . Emphysema Father   . Colon cancer Neg Hx     Social History   Socioeconomic History  . Marital status: Divorced    Spouse name: Not on file  . Number of children: Not on file  . Years of education: Not on file  . Highest education level: Not on file  Occupational History  . Not on file  Tobacco Use  . Smoking status: Former Smoker    Packs/day: 1.00    Years: 13.00    Pack years: 13.00    Types: Cigarettes    Quit date: 12/10/1998    Years since quitting: 21.0  . Smokeless tobacco: Never Used  Substance and Sexual Activity  . Alcohol use: No  . Drug use: No  . Sexual activity: Not on file  Other Topics Concern  . Not on file  Social History Narrative   Needs interpreter for Spanish   Social Determinants of Health   Financial Resource Strain:   . Difficulty of Paying Living Expenses:   Food Insecurity:   . Worried About Running  Out of Food in the Last Year:   . [place] in the Last Year:   Transportation Needs:   . Lack of Transportation (Medical):   Marland Kitchen Lack of Transportation (Non-Medical):   Physical Activity:   . Days of Exercise per Week:   . Minutes of Exercise per Session:   Stress:   . Feeling of Stress :   Social Connections:   . Frequency of Communication with Friends and Family:   . Frequency of Social Gatherings with Friends and Family:   . Attends Religious Services:   . Active Member of Clubs or Organizations:   . Attends Archivist Meetings:   Marland Kitchen Marital Status:     Subjective: Review of Systems  Constitutional: Negative for chills, fever, malaise/fatigue and weight loss.  HENT: Negative for congestion and sore throat.   Respiratory: Negative for cough and shortness of breath.   Cardiovascular: Negative for chest pain and palpitations.  Gastrointestinal: Negative for abdominal pain, blood in stool,  diarrhea, melena, nausea and vomiting.  Musculoskeletal: Negative for joint pain and myalgias.  Skin: Negative for rash.  Neurological: Negative for dizziness and weakness.  Endo/Heme/Allergies: Does not bruise/bleed easily.  Psychiatric/Behavioral: Negative for depression. The patient is not nervous/anxious.   All other systems reviewed and are negative.    Objective: BP (!) 145/78   Pulse 66   Temp (!) 97.1 F (36.2 C) (Oral)   Ht 5' 11"  (1.803 m)   Wt 203 lb 6.4 oz (92.3 kg)   BMI 28.37 kg/m  Physical Exam Vitals and nursing note reviewed.  Constitutional:      General: He is not in acute distress.    Appearance: Normal appearance. He is normal weight. He is not ill-appearing, toxic-appearing or diaphoretic.  HENT:     Head: Normocephalic and atraumatic.     Nose: No congestion or rhinorrhea.  Eyes:     General: No scleral icterus. Cardiovascular:     Rate and Rhythm: Normal rate and regular rhythm.     Heart sounds: Normal heart sounds.  Pulmonary:     Effort: Pulmonary effort is normal.     Breath sounds: Normal breath sounds.  Abdominal:     General: Bowel sounds are normal. There is no distension.     Palpations: Abdomen is soft. There is no hepatomegaly, splenomegaly or mass.     Tenderness: There is no abdominal tenderness. There is no guarding or rebound.     Hernia: No hernia is present.  Musculoskeletal:     Cervical back: Neck supple.  Skin:    General: Skin is warm and dry.     Coloration: Skin is not jaundiced.     Findings: No bruising or rash.  Neurological:     General: No focal deficit present.     Mental Status: He is alert and oriented to person, place, and time. Mental status is at baseline.  Psychiatric:        Mood and Affect: Mood normal.        Behavior: Behavior normal.        Thought Content: Thought content normal.       12/18/2019 11:21 AM   Disclaimer: This note was dictated with voice recognition software. Similar sounding  words can inadvertently be transcribed and may not be corrected upon review.

## 2019-12-18 NOTE — Assessment & Plan Note (Signed)
History of internal and external hemorrhoids with rectal bleeding resulting in iron deficiency anemia.  He is seen status post internal and external hemorrhoidectomy.  Doing well postop.  Avoiding straining.  Follow-up in 1 year, call for any worsening or severe symptoms.

## 2019-12-18 NOTE — Assessment & Plan Note (Signed)
Constipation significantly improved.  He has only had one episode since hemorrhoidectomy.  He is limiting toilet time to 5 minutes and avoiding straining.  He does have MiraLAX on hand that he uses for rare constipation which she states works quite well for him.  Recommend he continue his current medications, MiraLAX as needed, follow-up in 1 year.

## 2019-12-18 NOTE — Assessment & Plan Note (Signed)
Iron deficiency anemia from hemorrhoidal bleeding status post internal and external hemorrhoidectomy.  The patient is doing well at this point postoperatively.  Had one episode of very mild bleeding about 4 weeks ago but none since.  He is following toilet measures as per below to prevent constipation and straining.  He is set to have a CBC drawn today.  I will add on an iron panel and ferritin to check the status of his iron studies.  Follow-up in 1 year.

## 2019-12-18 NOTE — Progress Notes (Signed)
CC'ED TO PCP 

## 2019-12-18 NOTE — Patient Instructions (Signed)
English:  Your health issues we discussed today were:   Anemia from bleeding hemorrhoids: 1. I am glad your hemorrhoids are improved after surgery 2. Call us if you have any more bleeding 3. Try to avoid constipation 4. Have your blood test completed today 5. Call us if you have any worsening problems  Constipation: 1. I am glad your constipation is better 2. You can take MiraLAX 1-2 times a day, if needed, for constipation 3. Let us know if your constipation gets worse   Overall I recommend:  1. Continue your other medications 2. Follow-up in our office in 1 year 3. Call us if you have any questions or problems   Espanol:  Sus problemas de salud que discutimos hoy fueron:  Anemia por hemorroides sangrantes: 1. Me alegro de que sus hemorroides hayan mejorado despus de la [place]. 2. Llmanos si tienes ms sangrado. 3. Trate de evitar el estreimiento. 4. Hgase su anlisis de sangre hoy 5. Llmanos si tienes algn problema que empeora.  Estreimiento: 1. Me alegro que tu estreimiento est mejor 2. Puede tomar MiraLAX 1-2 veces al da, si es necesario, para el estreimiento. 3. Hganos saber si su estreimiento empeora   En general recomiendo: 1. Contine con sus otros medicamentos 2. Seguimiento en nuestra oficina en 1 ao 3. Llmanos si tienes alguna duda o problema.  English:  At [facility] we value your feedback. You may receive a survey about your visit today. Please share your experience as we strive to create trusting relationships with our patients to provide genuine, compassionate, quality care.  We appreciate your understanding and patience as we review any laboratory studies, imaging, and other diagnostic tests that are ordered as we care for you. Our office policy is 5 business days for review of these results, and any emergent or urgent results are addressed in a timely manner for your best interest. If you do not hear from our office in  1 week, please contact us.   We also encourage the use of MyChart, which contains your medical information for your review as well. If you are not enrolled in this feature, an access code is on this after visit summary for your convenience. Thank you for allowing [place] to be involved in your care.  Espanol:  En [facility] valoramos sus comentarios. Es posible que reciba una encuesta sobre su visita hoy. Comparta su experiencia mientras nos esforzamos por crear relaciones de confianza con nuestros pacientes para brindarles una atencin [place], [place] y de calidad.  Agradecemos su comprensin y [employer] estudios de laboratorio, las imgenes y Scientist, research (medical) pruebas de diagnstico que se solicitan mientras lo cuidamos. La poltica de [place] oficina es de 5 das hbiles para la revisin de [place], y cualquier resultado emergente o urgente se aborda de manera oportuna para su mejor inters. Si no recibe noticias de nuestra oficina en 1 semana, comunquese con nosotros.  Tambin recomendamos el uso de MyChart, que tambin contiene su informacin mdica para su revisin. Si no est inscrito en esta funcin, encontrar un cdigo de [employer] resumen posterior a la visita para su conveniencia. Gracias por permitirnos participar en su atencin.  Fue genial verte hoy! Espero que tengas un gran verano !!

## 2019-12-22 ENCOUNTER — Other Ambulatory Visit: Payer: Self-pay

## 2019-12-22 ENCOUNTER — Ambulatory Visit: Payer: Self-pay | Attending: Internal Medicine

## 2019-12-22 DIAGNOSIS — D509 Iron deficiency anemia, unspecified: Secondary | ICD-10-CM

## 2019-12-22 DIAGNOSIS — E119 Type 2 diabetes mellitus without complications: Secondary | ICD-10-CM

## 2019-12-22 NOTE — Addendum Note (Signed)
Addended by: Karle Plumber B on: 12/22/2019 04:17 PM   Modules accepted: Orders

## 2019-12-23 LAB — IRON,TIBC AND FERRITIN PANEL
Ferritin: 16 ng/mL — ABNORMAL LOW (ref 30–400)
Iron Saturation: 16 % (ref 15–55)
Iron: 58 ug/dL (ref 38–169)
Total Iron Binding Capacity: 366 ug/dL (ref 250–450)
UIBC: 308 ug/dL (ref 111–343)

## 2019-12-23 LAB — CBC
Hematocrit: 44.5 % (ref 37.5–51.0)
Hemoglobin: 14.4 g/dL (ref 13.0–17.7)
MCH: 28 pg (ref 26.6–33.0)
MCHC: 32.4 g/dL (ref 31.5–35.7)
MCV: 87 fL (ref 79–97)
Platelets: 356 10*3/uL (ref 150–450)
RBC: 5.14 x10E6/uL (ref 4.14–5.80)
RDW: 14.8 % (ref 11.6–15.4)
WBC: 7.5 10*3/uL (ref 3.4–10.8)

## 2019-12-23 LAB — HEMOGLOBIN A1C
Est. average glucose Bld gHb Est-mCnc: 131 mg/dL
Hgb A1c MFr Bld: 6.2 % — ABNORMAL HIGH (ref 4.8–5.6)

## 2019-12-23 NOTE — Progress Notes (Signed)
Let patient know that he is no longer anemic.  His iron stores are still low but this should improve over the next few months given that he is no longer having rectal bleeding.  His A1c is 6.2 meaning that his diabetes is well controlled.

## 2019-12-24 ENCOUNTER — Telehealth: Payer: Self-pay

## 2019-12-24 NOTE — Telephone Encounter (Signed)
[place] interpreters Myra  Id# [phone]  contacted pt to go over lab results pt didn't answer lvm asking pt to give a call back at his earliest convenience

## 2019-12-25 ENCOUNTER — Other Ambulatory Visit: Payer: Self-pay

## 2019-12-25 ENCOUNTER — Ambulatory Visit: Payer: Self-pay | Attending: Internal Medicine

## 2019-12-30 ENCOUNTER — Telehealth: Payer: Self-pay | Admitting: Internal Medicine

## 2019-12-30 NOTE — Telephone Encounter (Signed)
I recieved an email from [employer],  PATIENT HAS SPOUSE, WE WOULD NEED SPOUSE'S FIANANCIAL INFORMATION TO COMPLETE CAFA APPLICATION . WILL HOLD FOR 14 DAYS, Pt was call and LVM to call me back, an to confirmed that he is divorce that we need the divorce papers

## 2020-01-01 ENCOUNTER — Telehealth: Payer: Self-pay | Admitting: Internal Medicine

## 2020-01-01 NOTE — Telephone Encounter (Signed)
I called again the Pt to speak to him in referent of his CAFA application LVM to call me back

## 2020-01-02 ENCOUNTER — Telehealth: Payer: Self-pay | Admitting: Internal Medicine

## 2020-01-02 NOTE — Telephone Encounter (Signed)
Pt was call and inform that the original documents he submit are ready for pick up, will be on Dr. Wynetta Emery folder

## 2020-01-20 ENCOUNTER — Encounter: Payer: Self-pay | Admitting: Internal Medicine

## 2020-01-20 ENCOUNTER — Telehealth: Payer: Self-pay

## 2020-01-20 ENCOUNTER — Other Ambulatory Visit: Payer: Self-pay

## 2020-01-20 ENCOUNTER — Ambulatory Visit: Payer: Self-pay | Attending: Internal Medicine | Admitting: Internal Medicine

## 2020-01-20 VITALS — BP 151/87 | HR 71 | Temp 98.1°F | Resp 16 | Wt 209.0 lb

## 2020-01-20 DIAGNOSIS — E119 Type 2 diabetes mellitus without complications: Secondary | ICD-10-CM

## 2020-01-20 DIAGNOSIS — R1033 Periumbilical pain: Secondary | ICD-10-CM

## 2020-01-20 LAB — GLUCOSE, POCT (MANUAL RESULT ENTRY): POC Glucose: 150 mg/dl — AB (ref 70–99)

## 2020-01-20 NOTE — Progress Notes (Signed)
Patient ID: Ricardo Ward, male    DOB: 1971/02/12  MRN: [id number]  CC: Abdominal pain  Subjective: Ricardo Ward is a 49 y.o. male who presents for urgent care visit His concerns today include:  IDA, DM, HL, hx COVID infection, hemorrhoids s/p removal, esophagitis  Pt c/o periumbilical pain to the right of the umbilicus x 1 wk. Pain is there all the time but worse with sneezing or coughing and with bending forward; goes away when he lays down. Also better if he pushes on the area. Moving bowels okay -no worse or better with food. No N/V. No bulging of the abdomen.  No fever.  No dysuria.  He had surgery on his lower abdomen that left midline scar from having appendectomy back in the 1990s. Rates pain 4-5/10. Works in the [employer] at [employer].  Does not do lot of heavy lifting at work.  The most that he has to lift his about 30 pounds..     I went over the results of the lab test with him that were done a month ago.  He was no longer anemic but iron stores are still low.  I told him I think this would get better given that he is no longer having rectal bleeding.  His A1c was less than 7 which meant that his diabetes was under good control at that time. Patient Active Problem List   Diagnosis Date Noted   Esophagitis 12/16/2019   S/P hemorrhoidectomy 12/16/2019   Internal and external bleeding hemorrhoids    Hyperlipidemia associated with type 2 diabetes mellitus ([place]) 07/28/2019   Hyperlipidemia 05/04/2019   Elevated blood pressure reading 05/02/2019   Iron deficiency anemia 03/17/2019   Rectal bleeding 03/17/2019   Constipation 03/17/2019   New onset type 2 diabetes mellitus ([place]) 02/06/2019   Microcytic anemia 01/24/2019   Knee pain, right 03/05/2017   Plantar fasciitis of left foot 03/05/2017   Pterygium of both eyes 05/26/2013     Current Outpatient Medications on File Prior to Visit  Medication Sig Dispense Refill   atorvastatin (LIPITOR) 20 MG tablet  Take 1 tablet (20 mg total) by mouth daily. 90 tablet 3   blood glucose meter kit and supplies Dispense based on patient and insurance preference. Use up to four times daily as directed. (FOR ICD-10 E10.9, E11.9). 1 each 0   metFORMIN (GLUCOPHAGE) 500 MG tablet Take 1 tablet (500 mg total) by mouth daily with breakfast. 30 tablet 4   Multiple Vitamin (MULTIVITAMIN WITH MINERALS) TABS tablet Take 1 tablet by mouth daily.     No current facility-administered medications on file prior to visit.    Allergies  Allergen Reactions   Squid Oil Rash    Squid causes a rash    Social History   Socioeconomic History   Marital status: Divorced    Spouse name: Not on file   Number of children: Not on file   Years of education: Not on file   Highest education level: Not on file  Occupational History   Not on file  Tobacco Use   Smoking status: Former Smoker    Packs/day: 1.00    Years: 13.00    Pack years: 13.00    Types: Cigarettes    Quit date: 12/10/1998    Years since quitting: 21.1   Smokeless tobacco: Never Used  Substance and Sexual Activity   Alcohol use: No   Drug use: No   Sexual activity: Not on file  Other Topics Concern  Not on file  Social History Narrative   Needs interpreter for Spanish   Social Determinants of Health   Financial Resource Strain:    Difficulty of Paying Living Expenses:   Food Insecurity:    Worried About Charity fundraiser in the Last Year:    Arboriculturist in the Last Year:   Transportation Needs:    Film/video editor (Medical):    Lack of Transportation (Non-Medical):   Physical Activity:    Days of Exercise per Week:    Minutes of Exercise per Session:   Stress:    Feeling of Stress :   Social Connections:    Frequency of Communication with Friends and Family:    Frequency of Social Gatherings with Friends and Family:    Attends Religious Services:    Active Member of Clubs or Organizations:     Attends Music therapist:    Marital Status:   Intimate Partner Violence:    Fear of Current or Ex-Partner:    Emotionally Abused:    Physically Abused:    Sexually Abused:     Family History  Problem Relation Age of Onset   Alzheimer's disease Mother    Emphysema Father    Colon cancer Neg Hx     Past Surgical History:  Procedure Laterality Date   APPENDECTOMY     BACK SURGERY     BIOPSY  09/03/2019   Procedure: BIOPSY;  Surgeon: Danie Binder, MD;  [facility];  Service: Endoscopy;;  gastric   COLONOSCOPY N/A 09/03/2019   Procedure: COLONOSCOPY;  Surgeon: Danie Binder, MD;  [facility];  Service: Endoscopy;  Laterality: N/A;  3:00pm   ESOPHAGOGASTRODUODENOSCOPY N/A 09/03/2019   Procedure: ESOPHAGOGASTRODUODENOSCOPY (EGD);  Surgeon: Danie Binder, MD;  [facility];  Service: Endoscopy;  Laterality: N/A;   EYE SURGERY Bilateral 06   pterygium    HEMORRHOID SURGERY N/A 10/01/2019   Procedure: HEMORRHOIDECTOMY EXTENSIVE;  Surgeon: Aviva Signs, MD;  [facility];  Service: General;  Laterality: N/A;   POLYPECTOMY  09/03/2019   Procedure: POLYPECTOMY;  Surgeon: Danie Binder, MD;  [facility];  Service: Endoscopy;;  hepatic flexure   PTERYGIUM EXCISION Bilateral    PTERYGIUM EXCISION Right 11/12/2013   Procedure: PTERYGIUM EXCISION WITH MITOMYCIN C AND AMNIOTIC MEMBRANE USING TISSEEL GLUE RIGHT EYE;  Surgeon: Marylynn Pearson, MD;  Location: [place];  Service: Ophthalmology;  Laterality: Right;    ROS: Review of Systems Negative except as stated above  PHYSICAL EXAM: BP (!) 151/87    Pulse 71    Temp 98.1 F (36.7 C)    Resp 16    Wt 209 lb (94.8 kg)    SpO2 96%    BMI 29.15 kg/m   Physical Exam  General appearance - alert, well appearing, and in no distress Mental status - normal mood, behavior, speech, dress, motor activity, and thought processes Abdomen -normal bowel sounds, nondistended,  soft, nontender.  No bulging of abdominal muscle noted around the periumbilical area or to the right of the umbilicus which is where the pain is located.  He has a large healed midline scar below the umbilicus  CMP Latest Ref Rng & Units 05/02/2019 01/29/2019 01/28/2019  Glucose 70 - 99 mg/dL - 92 106(H)  BUN 6 - 20 mg/dL - 16 17  Creatinine 0.61 - 1.24 mg/dL - 0.74 0.76  Sodium 135 - 145 mmol/L - 137 138  Potassium 3.5 - 5.1 mmol/L - 3.8 3.3(L)  Chloride 98 - 111 mmol/L - 105 103  CO2 22 - 32 mmol/L - 23 24  Calcium 8.9 - 10.3 mg/dL - 8.4(L) 8.4(L)  Total Protein 6.0 - 8.5 g/dL 6.9 6.5 7.0  Total Bilirubin 0.0 - 1.2 mg/dL 0.2 0.3 0.2(L)  Alkaline Phos 39 - 117 IU/L 78 74 74  AST 0 - 40 IU/L _0 ALT 0 - 44 IU/L 21 25 34   Lipid Panel     Component Value Date/Time   CHOL 178 05/02/2019 1225   TRIG 84 05/02/2019 1225   HDL 58 05/02/2019 1225   CHOLHDL 3.1 05/02/2019 1225   LDLCALC 105 (H) 05/02/2019 1225    CBC    Component Value Date/Time   WBC 7.5 12/22/2019 1543   WBC 5.7 08/21/2019 1247   RBC 5.14 12/22/2019 1543   RBC 4.52 08/21/2019 1247   HGB 14.4 12/22/2019 1543   HCT 44.5 12/22/2019 1543   PLT 356 12/22/2019 1543   MCV 87 12/22/2019 1543   MCH 28.0 12/22/2019 1543   MCH 30.5 08/21/2019 1247   MCHC 32.4 12/22/2019 1543   MCHC 33.3 08/21/2019 1247   RDW 14.8 12/22/2019 1543   LYMPHSABS 1.5 08/21/2019 1247   LYMPHSABS 2.0 12/28/2016 1623   MONOABS 0.4 08/21/2019 1247   EOSABS 0.8 (H) 08/21/2019 1247   EOSABS 1.1 (H) 12/28/2016 1623   BASOSABS 0.1 08/21/2019 1247   BASOSABS 0.1 12/28/2016 1623   Results for orders placed or performed in visit on 01/20/20  POCT glucose (manual entry)  Result Value Ref Range   POC Glucose 150 (A) 70 - 99 mg/dl    ASSESSMENT AND PLAN: 1. Periumbilical abdominal pain Differential diagnoses include pulled muscle versus hernia.  Patient denies any recent heavy lifting or strenuous pushing or pulling.  The area is to the left  of an old healed abdominal scar from appendectomy.  We will get a CAT scan of the abdomen to evaluate for possible hernia - Basic Metabolic Panel - CT Abdomen Pelvis W Contrast; Future  2. Controlled type 2 diabetes mellitus without complication, without long-term current use of insulin (HCC) Controlled.  Continue Metformin - POCT glucose (manual entry)   Patient was given the opportunity to ask questions.  Patient verbalized understanding of the plan and was able to repeat key elements of the plan.  [employer] interpreter used during this encounter. #[id number]  Orders Placed This Encounter  Procedures   POCT glucose (manual entry)     Requested Prescriptions    No prescriptions requested or ordered in this encounter    No follow-ups on file.  Karle Plumber, MD, FACP

## 2020-01-20 NOTE — Telephone Encounter (Signed)
error 

## 2020-01-21 ENCOUNTER — Telehealth: Payer: Self-pay

## 2020-01-21 LAB — BASIC METABOLIC PANEL
BUN/Creatinine Ratio: 15 (ref 9–20)
BUN: 16 mg/dL (ref 6–24)
CO2: 21 mmol/L (ref 20–29)
Calcium: 9.7 mg/dL (ref 8.7–10.2)
Chloride: 105 mmol/L (ref 96–106)
Creatinine, Ser: 1.05 mg/dL (ref 0.76–1.27)
GFR calc Af Amer: 95 mL/min/{1.73_m2} (ref >59)
GFR calc non Af Amer: 82 mL/min/{1.73_m2} (ref >59)
Glucose: 99 mg/dL (ref 65–99)
Potassium: 4.1 mmol/L (ref 3.5–5.2)
Sodium: 140 mmol/L (ref 134–144)

## 2020-01-21 NOTE — Telephone Encounter (Signed)
[employer] [place]  Id# [id number]  contacted pt to go over lab results and to go over appointment information for CT. Pt didn't answer left a detailed vm informing pt of results and appointment and if he has any questions or concerns to give a call   CT is schedule for June 9 at 4pm at [facility] Pt will need to arrive at 345pm. Pt will be NPO after 12am. Pt is to go to [facility] to pick up prep kit

## 2020-01-28 ENCOUNTER — Other Ambulatory Visit: Payer: Self-pay

## 2020-01-28 ENCOUNTER — Ambulatory Visit (HOSPITAL_COMMUNITY)
Admission: RE | Admit: 2020-01-28 | Discharge: 2020-01-28 | Disposition: A | Discharge status: Home / Self Care | Payer: Self-pay | Source: Ambulatory Visit | Attending: Internal Medicine | Admitting: Internal Medicine

## 2020-01-28 DIAGNOSIS — R1033 Periumbilical pain: Secondary | ICD-10-CM | POA: Insufficient documentation

## 2020-01-28 MED ORDER — IOHEXOL 300 MG/ML  SOLN
100.0000 mL | Freq: Once | INTRAMUSCULAR | Status: AC | PRN
  Administered 2020-01-28: 100 mL via INTRAVENOUS

## 2020-02-16 MED FILL — ?ATORVASTATIN 20 MG TABLET: 20 | 30 days supply | Qty: 30 | Fill #1

## 2020-02-16 MED FILL — ?PANTOPRAZOLE SO DR 40MG TA: 40 | 30 days supply | Qty: 60 | Fill #1

## 2020-02-16 MED FILL — METFORMIN HCL 500 MG TABS: 500 | 30 days supply | Qty: 30 | Fill #1

## 2020-02-17 ENCOUNTER — Ambulatory Visit: Payer: Self-pay | Admitting: Gastroenterology

## 2020-04-12 MED FILL — METFORMIN HCL 500 MG TABS: 500 | 30 days supply | Qty: 30 | Fill #2

## 2020-04-12 MED FILL — ?ATORVASTATIN 20 MG TABLET: 20 | 30 days supply | Qty: 30 | Fill #2

## 2020-05-19 MED FILL — METFORMIN HCL 500 MG TABS: 500 | 60 days supply | Qty: 60 | Fill #3

## 2020-05-19 MED FILL — PANTOPRAZOLE SOD DR 40 MG T: 40 | 60 days supply | Qty: 120 | Fill #2

## 2020-05-19 MED FILL — ATORVASTATIN CALCIUM 20 MG: 20 | 90 days supply | Qty: 90 | Fill #3

## 2020-09-22 ENCOUNTER — Other Ambulatory Visit: Payer: Self-pay | Admitting: Pharmacist

## 2020-09-22 DIAGNOSIS — E785 Hyperlipidemia, unspecified: Secondary | ICD-10-CM

## 2020-09-22 DIAGNOSIS — E119 Type 2 diabetes mellitus without complications: Secondary | ICD-10-CM

## 2020-09-22 DIAGNOSIS — E1169 Type 2 diabetes mellitus with other specified complication: Secondary | ICD-10-CM

## 2020-09-22 MED ORDER — TRUEPLUS LANCETS 28G MISC: 0 refills | Status: DC

## 2020-09-22 MED ORDER — TRUE METRIX METER W/DEVICE KIT: PACK | 0 refills | Status: AC

## 2020-09-22 MED ORDER — METFORMIN HCL 500 MG PO TABS: 500.0000 mg | ORAL_TABLET | Freq: Every day | ORAL | 0 refills | Status: DC

## 2020-09-22 MED ORDER — TRUE METRIX BLOOD GLUCOSE TEST VI STRP: ORAL_STRIP | 0 refills | Status: DC

## 2020-09-22 MED ORDER — ATORVASTATIN CALCIUM 20 MG PO TABS: 20.0000 mg | ORAL_TABLET | Freq: Every day | ORAL | 0 refills | Status: DC

## 2020-09-22 MED FILL — TRUE METRIX GLUCOSE TEST ST: 25 days supply | Qty: 100 | Fill #0

## 2020-09-22 MED FILL — METFORMIN HCL 500 MG TABS: 500 | 30 days supply | Qty: 30 | Fill #0

## 2020-09-22 MED FILL — !TRUE METRIX BLOOD GLUCOSE: 30 days supply | Qty: 1 | Fill #0

## 2020-09-22 MED FILL — TRUEplus LANCETS 28G MISC: 25 days supply | Qty: 100 | Fill #0

## 2020-09-22 MED FILL — ATORVASTATIN CALCIUM 20 MG: 20 | 30 days supply | Qty: 30 | Fill #0

## 2020-10-13 ENCOUNTER — Encounter: Payer: Self-pay | Admitting: Internal Medicine

## 2020-10-13 ENCOUNTER — Ambulatory Visit: Payer: Self-pay | Attending: Internal Medicine | Admitting: Internal Medicine

## 2020-10-13 ENCOUNTER — Other Ambulatory Visit: Payer: Self-pay | Admitting: Internal Medicine

## 2020-10-13 ENCOUNTER — Other Ambulatory Visit: Payer: Self-pay

## 2020-10-13 VITALS — BP 155/98 | HR 72 | Resp 16 | Wt 224.6 lb

## 2020-10-13 DIAGNOSIS — E119 Type 2 diabetes mellitus without complications: Secondary | ICD-10-CM

## 2020-10-13 DIAGNOSIS — D500 Iron deficiency anemia secondary to blood loss (chronic): Secondary | ICD-10-CM

## 2020-10-13 DIAGNOSIS — G4710 Hypersomnia, unspecified: Secondary | ICD-10-CM

## 2020-10-13 DIAGNOSIS — D509 Iron deficiency anemia, unspecified: Secondary | ICD-10-CM

## 2020-10-13 DIAGNOSIS — E782 Mixed hyperlipidemia: Secondary | ICD-10-CM

## 2020-10-13 DIAGNOSIS — N529 Male erectile dysfunction, unspecified: Secondary | ICD-10-CM | POA: Insufficient documentation

## 2020-10-13 DIAGNOSIS — I10 Essential (primary) hypertension: Secondary | ICD-10-CM

## 2020-10-13 DIAGNOSIS — E1169 Type 2 diabetes mellitus with other specified complication: Secondary | ICD-10-CM

## 2020-10-13 DIAGNOSIS — E785 Hyperlipidemia, unspecified: Secondary | ICD-10-CM

## 2020-10-13 DIAGNOSIS — N521 Erectile dysfunction due to diseases classified elsewhere: Secondary | ICD-10-CM

## 2020-10-13 LAB — POCT GLYCOSYLATED HEMOGLOBIN (HGB A1C): HbA1c, POC (controlled diabetic range): 6.3 % (ref 0.0–7.0)

## 2020-10-13 LAB — GLUCOSE, POCT (MANUAL RESULT ENTRY): POC Glucose: 152 mg/dl — AB (ref 70–99)

## 2020-10-13 MED ORDER — TADALAFIL 20 MG PO TABS: 10.0000 mg | ORAL_TABLET | ORAL | 11 refills | Status: DC | PRN

## 2020-10-13 MED ORDER — ATORVASTATIN CALCIUM 20 MG PO TABS: 20.0000 mg | ORAL_TABLET | Freq: Every day | ORAL | 3 refills | Status: DC

## 2020-10-13 MED ORDER — METFORMIN HCL 500 MG PO TABS: 500.0000 mg | ORAL_TABLET | Freq: Every day | ORAL | 3 refills | Status: DC

## 2020-10-13 MED ORDER — PANTOPRAZOLE SODIUM 40 MG PO TBEC: 40.0000 mg | DELAYED_RELEASE_TABLET | Freq: Every day | ORAL | 3 refills | Status: DC

## 2020-10-13 MED ORDER — LISINOPRIL-HYDROCHLOROTHIAZIDE 20-25 MG PO TABS: 1.0000 | ORAL_TABLET | Freq: Every day | ORAL | 1 refills | Status: DC

## 2020-10-13 MED FILL — PANTOPRAZOLE SOD DR 40 MG T: 40 | 30 days supply | Qty: 30 | Fill #0

## 2020-10-13 NOTE — Assessment & Plan Note (Signed)
Check labs today Refill statin

## 2020-10-13 NOTE — Progress Notes (Signed)
He is here for refill 2 medications.   DM- he does not monitor cbgs at home. He does not follow a DM diet. He is adherent with medications.   Lipids- he takes atorvastatin  He has hx of reflux- cause of iron deficiency anemai- he takes pantoprazole 4o mg po bid.   He feels will and has no c/o.  He complains of being quite tired and has lost sexual interest. He admits to snoring and his wife tells him that he quits breathing at night. He admits to daytinme somnolence.   Past Medical History:  Diagnosis Date  . COVID-19 12/2018   s/p hospitalization and recovery  . Diabetes ([place])   . Pterygium    rt eye  . Pterygium of both eyes    removal    Social History   Socioeconomic History  . Marital status: Divorced    Spouse name: Not on file  . Number of children: Not on file  . Years of education: Not on file  . Highest education level: Not on file  Occupational History  . Not on file  Tobacco Use  . Smoking status: Former Smoker    Packs/day: 1.00    Years: 13.00    Pack years: 13.00    Types: Cigarettes    Quit date: 12/10/1998    Years since quitting: 21.8  . Smokeless tobacco: Never Used  Vaping Use  . Vaping Use: Never used  Substance and Sexual Activity  . Alcohol use: No  . Drug use: No  . Sexual activity: Not on file  Other Topics Concern  . Not on file  Social History Narrative   Needs interpreter for Spanish   Social Determinants of Health   Financial Resource Strain: Not on file  Food Insecurity: Not on file  Transportation Needs: Not on file  Physical Activity: Not on file  Stress: Not on file  Social Connections: Not on file  Intimate Partner Violence: Not on file    Past Surgical History:  Procedure Laterality Date  . APPENDECTOMY    . BACK SURGERY    . BIOPSY  09/03/2019   Procedure: BIOPSY;  Surgeon: Danie Binder, MD;  [facility];  Service: Endoscopy;;  gastric  . COLONOSCOPY N/A 09/03/2019   Procedure: COLONOSCOPY;   Surgeon: Danie Binder, MD;  [facility];  Service: Endoscopy;  Laterality: N/A;  3:00pm  . ESOPHAGOGASTRODUODENOSCOPY N/A 09/03/2019   Procedure: ESOPHAGOGASTRODUODENOSCOPY (EGD);  Surgeon: Danie Binder, MD;  [facility];  Service: Endoscopy;  Laterality: N/A;  . EYE SURGERY Bilateral 06   pterygium   . HEMORRHOID SURGERY N/A 10/01/2019   Procedure: HEMORRHOIDECTOMY EXTENSIVE;  Surgeon: Aviva Signs, MD;  Location: [facility];  Service: General;  Laterality: N/A;  . POLYPECTOMY  09/03/2019   Procedure: POLYPECTOMY;  Surgeon: Danie Binder, MD;  [facility];  Service: Endoscopy;;  hepatic flexure  . PTERYGIUM EXCISION Bilateral   . PTERYGIUM EXCISION Right 11/12/2013   Procedure: PTERYGIUM EXCISION WITH MITOMYCIN C AND AMNIOTIC MEMBRANE USING TISSEEL GLUE RIGHT EYE;  Surgeon: Marylynn Pearson, MD;  Location: [place];  Service: Ophthalmology;  Laterality: Right;    Family History  Problem Relation Age of Onset  . Alzheimer's disease Mother   . Emphysema Father   . Colon cancer Neg Hx     Allergies  Allergen Reactions  . Squid Oil Rash    Squid causes a rash    Current Outpatient Medications on  File Prior to Visit  Medication Sig Dispense Refill  . Blood Glucose Monitoring Suppl (TRUE METRIX METER) w/Device KIT Use to check blood sugar once daily. 1 kit 0  . glucose blood (TRUE METRIX BLOOD GLUCOSE TEST) test strip Use to check blood sugar once daily. 100 each 0  . TRUEplus Lancets 28G MISC Use to check blood sugar once daily. 100 each 0  . atorvastatin (LIPITOR) 20 MG tablet Take 1 tablet (20 mg total) by mouth daily. 30 tablet 0  . blood glucose meter kit and supplies Dispense based on patient and insurance preference. Use up to four times daily as directed. (FOR ICD-10 E10.9, E11.9). 1 each 0  . metFORMIN (GLUCOPHAGE) 500 MG tablet Take 1 tablet (500 mg total) by mouth daily with breakfast. 30 tablet 0  . Multiple Vitamin (MULTIVITAMIN WITH MINERALS)  TABS tablet Take 1 tablet by mouth daily.     No current facility-administered medications on file prior to visit.     patient denies chest pain, shortness of breath, orthopnea. Denies lower extremity edema, abdominal pain, change in appetite, change in bowel movements. Patient denies rashes, musculoskeletal complaints. No other specific complaints in a complete review of systems.   BP (!) 155/98   Pulse 72   Resp 16   Wt 224 lb 9.6 oz (101.9 kg)   SpO2 95%   BMI 31.33 kg/m   well-developed well-nourished male in no acute distress. HEENT exam atraumatic, normocephalic, neck supple without jugular venous distention. Chest clear to auscultation cardiac exam S1-S2 are regular. Abdominal exam overweight with bowel sounds, soft and nontender. Extremities no edema. Neurologic exam is alert with a normal gait.

## 2020-10-13 NOTE — Assessment & Plan Note (Signed)
bp is too high Will add lisinopril/hct F/u 3 months

## 2020-10-13 NOTE — Assessment & Plan Note (Signed)
>>  ASSESSMENT AND PLAN FOR HYPERLIPIDEMIA WRITTEN ON 10/13/2020  4:19 PM BY SWORDS, Valetta Mole, MD  Check labs today Refill statin

## 2020-10-13 NOTE — Assessment & Plan Note (Signed)
He gives a good hx for OSA Home sleep study

## 2020-10-13 NOTE — Assessment & Plan Note (Signed)
Ok to decrease ppi to qd

## 2020-10-13 NOTE — Assessment & Plan Note (Signed)
I wonder if this is related to sleep disturbance.  Will use cialis May eventurally need to test testosterone level

## 2020-10-13 NOTE — Assessment & Plan Note (Signed)
Lab Results  Component Value Date   HGBA1C 6.3 10/13/2020   a1c is adeuqte Encouraged weight loss

## 2020-10-14 LAB — BASIC METABOLIC PANEL
BUN/Creatinine Ratio: 17 (ref 9–20)
BUN: 18 mg/dL (ref 6–24)
CO2: 19 mmol/L — ABNORMAL LOW (ref 20–29)
Calcium: 10 mg/dL (ref 8.7–10.2)
Chloride: 101 mmol/L (ref 96–106)
Creatinine, Ser: 1.07 mg/dL (ref 0.76–1.27)
GFR calc Af Amer: 93 mL/min/{1.73_m2} (ref >59)
GFR calc non Af Amer: 81 mL/min/{1.73_m2} (ref >59)
Glucose: 113 mg/dL — ABNORMAL HIGH (ref 65–99)
Potassium: 4 mmol/L (ref 3.5–5.2)
Sodium: 139 mmol/L (ref 134–144)

## 2020-10-14 LAB — LIPID PANEL
Chol/HDL Ratio: 3.2 ratio (ref 0.0–5.0)
Cholesterol, Total: 143 mg/dL (ref 100–199)
HDL: 45 mg/dL (ref >39)
LDL Chol Calc (NIH): 69 mg/dL (ref 0–99)
Triglycerides: 172 mg/dL — ABNORMAL HIGH (ref 0–149)
VLDL Cholesterol Cal: 29 mg/dL (ref 5–40)

## 2020-10-14 LAB — MICROALBUMIN / CREATININE URINE RATIO
Creatinine, Urine: 191.9 mg/dL
Microalb/Creat Ratio: 55 mg/g creat — ABNORMAL HIGH (ref 0–29)
Microalbumin, Urine: 104.9 ug/mL

## 2020-10-14 MED FILL — TADALAFIL (PAH) 20 MG TABS: 20 | 15 days supply | Qty: 5 | Fill #0

## 2020-10-25 ENCOUNTER — Encounter: Payer: Self-pay | Admitting: Internal Medicine

## 2020-11-06 ENCOUNTER — Other Ambulatory Visit: Payer: Self-pay

## 2020-11-06 ENCOUNTER — Encounter (HOSPITAL_COMMUNITY): Payer: Self-pay | Admitting: Emergency Medicine

## 2020-11-06 ENCOUNTER — Ambulatory Visit (INDEPENDENT_AMBULATORY_CARE_PROVIDER_SITE_OTHER): Payer: Self-pay

## 2020-11-06 ENCOUNTER — Ambulatory Visit (HOSPITAL_COMMUNITY)
Admission: EM | Admit: 2020-11-06 | Discharge: 2020-11-06 | Disposition: A | Discharge status: Home / Self Care | Payer: Self-pay | Attending: Urgent Care | Admitting: Urgent Care

## 2020-11-06 DIAGNOSIS — M25561 Pain in right knee: Secondary | ICD-10-CM

## 2020-11-06 DIAGNOSIS — M25461 Effusion, right knee: Secondary | ICD-10-CM

## 2020-11-06 MED ORDER — PREDNISONE 20 MG PO TABS: ORAL_TABLET | ORAL | 0 refills | Status: DC

## 2020-11-06 NOTE — ED Provider Notes (Signed)
[place]   MRN: [id number] DOB: 25-Jun-1971  Subjective:   Ricardo Ward is a 50 y.o. male presenting for 2 to 3-week history of persistent right knee pain and swelling moderate to severe nature.  Has had difficulty with his range of motion as well, has to walk very gingerly and with a limp.  Denies any particular fall, trauma.  He does stand or walk for the majority of his shift at his job.  Denies history of arthritis or gout.  He does have well-controlled diabetes.  No current facility-administered medications for this encounter.  Current Outpatient Medications:  .  atorvastatin (LIPITOR) 20 MG tablet, Take 1 tablet (20 mg total) by mouth daily., Disp: 90 tablet, Rfl: 3 .  lisinopril-hydrochlorothiazide (ZESTORETIC) 20-25 MG tablet, Take 1 tablet by mouth daily., Disp: 90 tablet, Rfl: 1 .  metFORMIN (GLUCOPHAGE) 500 MG tablet, Take 1 tablet (500 mg total) by mouth daily with breakfast., Disp: 90 tablet, Rfl: 3 .  Multiple Vitamin (MULTIVITAMIN WITH MINERALS) TABS tablet, Take 1 tablet by mouth daily., Disp: , Rfl:  .  pantoprazole (PROTONIX) 40 MG tablet, Take 1 tablet (40 mg total) by mouth daily., Disp: 90 tablet, Rfl: 3 .  blood glucose meter kit and supplies, Dispense based on patient and insurance preference. Use up to four times daily as directed. (FOR ICD-10 E10.9, E11.9)., Disp: 1 each, Rfl: 0 .  Blood Glucose Monitoring Suppl (TRUE METRIX METER) w/Device KIT, Use to check blood sugar once daily., Disp: 1 kit, Rfl: 0 .  glucose blood (TRUE METRIX BLOOD GLUCOSE TEST) test strip, Use to check blood sugar once daily., Disp: 100 each, Rfl: 0 .  tadalafil (CIALIS) 20 MG tablet, Take 0.5-1 tablets (10-20 mg total) by mouth every other day as needed for erectile dysfunction., Disp: 5 tablet, Rfl: 11 .  TRUEplus Lancets 28G MISC, Use to check blood sugar once daily., Disp: 100 each, Rfl: 0   Allergies  Allergen Reactions  . Squid Oil Rash    Squid causes a rash     Past Medical History:  Diagnosis Date  . COVID-19 12/2018   s/p hospitalization and recovery  . Diabetes ([place])   . Pterygium    rt eye  . Pterygium of both eyes    removal     Past Surgical History:  Procedure Laterality Date  . APPENDECTOMY    . BACK SURGERY    . BIOPSY  09/03/2019   Procedure: BIOPSY;  Surgeon: Danie Binder, MD;  [facility];  Service: Endoscopy;;  gastric  . COLONOSCOPY N/A 09/03/2019   Procedure: COLONOSCOPY;  Surgeon: Danie Binder, MD;  [facility];  Service: Endoscopy;  Laterality: N/A;  3:00pm  . ESOPHAGOGASTRODUODENOSCOPY N/A 09/03/2019   Procedure: ESOPHAGOGASTRODUODENOSCOPY (EGD);  Surgeon: Danie Binder, MD;  [facility];  Service: Endoscopy;  Laterality: N/A;  . EYE SURGERY Bilateral 06   pterygium   . HEMORRHOID SURGERY N/A 10/01/2019   Procedure: HEMORRHOIDECTOMY EXTENSIVE;  Surgeon: Aviva Signs, MD;  Location[facility];  Service: General;  Laterality: N/A;  . POLYPECTOMY  09/03/2019   Procedure: POLYPECTOMY;  Surgeon: Danie Binder, MD;  [facility];  Service: Endoscopy;;  hepatic flexure  . PTERYGIUM EXCISION Bilateral   . PTERYGIUM EXCISION Right 11/12/2013   Procedure: PTERYGIUM EXCISION WITH MITOMYCIN C AND AMNIOTIC MEMBRANE USING TISSEEL GLUE RIGHT EYE;  Surgeon: Marylynn Pearson, MD;  Location: [place];  Service: Ophthalmology;  Laterality:  Right;    Family History  Problem Relation Age of Onset  . Alzheimer's disease Mother   . Emphysema Father   . Colon cancer Neg Hx     Social History   Tobacco Use  . Smoking status: Former Smoker    Packs/day: 1.00    Years: 13.00    Pack years: 13.00    Types: Cigarettes    Quit date: 12/10/1998    Years since quitting: 21.9  . Smokeless tobacco: Never Used  Vaping Use  . Vaping Use: Never used  Substance Use Topics  . Alcohol use: No  . Drug use: No    ROS   Objective:   Vitals: BP (!) 153/82 (BP Location: Right Arm)   Pulse  67   Temp 97.8 F (36.6 C) (Oral)   Resp 18   SpO2 97%   Physical Exam Constitutional:      General: He is not in acute distress.    Appearance: Normal appearance. He is well-developed and normal weight. He is not ill-appearing, toxic-appearing or diaphoretic.  HENT:     Head: Normocephalic and atraumatic.     Right Ear: External ear normal.     Left Ear: External ear normal.     Nose: Nose normal.     Mouth/Throat:     Pharynx: Oropharynx is clear.  Eyes:     General: No scleral icterus.       Right eye: No discharge.        Left eye: No discharge.     Extraocular Movements: Extraocular movements intact.     Pupils: Pupils are equal, round, and reactive to light.  Cardiovascular:     Rate and Rhythm: Normal rate.  Pulmonary:     Effort: Pulmonary effort is normal.  Musculoskeletal:     Cervical back: Normal range of motion.     Right knee: Swelling (trace) present. No deformity, erythema, ecchymosis, lacerations, bony tenderness or crepitus. Decreased range of motion. Tenderness present over the medial joint line (over areas outlined) and patellar tendon. Normal alignment, normal meniscus and normal patellar mobility.       Legs:  Neurological:     Mental Status: He is alert and oriented to person, place, and time.  Psychiatric:        Mood and Affect: Mood normal.        Behavior: Behavior normal.        Thought Content: Thought content normal.        Judgment: Judgment normal.     DG Knee Complete 4 Views Right  Result Date: 11/06/2020 CLINICAL DATA:  Pain in knee for 2 weeks. No known injury. Right knee is swollen. Symptoms were worse 3 days ago :swelling and pain and limited range of motion. Symptoms have improved some, but not normal. EXAM: RIGHT KNEE - COMPLETE 4+ VIEW COMPARISON:  Right knee radiographs 02/05/2017 FINDINGS: No evidence of fracture or dislocation. Possible trace effusion. No evidence of arthropathy or other focal bone abnormality. Soft tissues are  unremarkable. IMPRESSION: No acute osseous abnormality. Possible trace effusion. Electronically Signed   By: Audie Pinto M.D.   On: 11/06/2020 17:32     Assessment and Plan :   PDMP not reviewed this encounter.  1. Pain and swelling of right knee     No definitive etiology for patient's physical exam findings, symptoms.  Recommended oral prednisone course and follow-up with Ortho.  Applied an Ace wrap of 6 inches to the right knee.  Counseled  on knee care.  Provided with a note for work. Counseled patient on potential for adverse effects with medications prescribed/recommended today, ER and return-to-clinic precautions discussed, patient verbalized understanding.    Jaynee Eagles, PA-C 11/06/20 1743

## 2020-11-06 NOTE — ED Triage Notes (Addendum)
Pain in knee for 2 weeks.  No known injury.  Right knee is swollen.  Symptoms were worse 3 days ago :swelling and pain and limited range of motion.  Symptoms have improved some, but not normal.  Patient has been elevating leg, against wall.  Seemed to help.  If he bends forward, feels pain in hip, lower back, into leg-pulling sensation and crunching noise in right knee  Pain intially in back of knee and now is in front of knee.  Patient stands all day at work.

## 2020-11-23 ENCOUNTER — Other Ambulatory Visit: Payer: Self-pay

## 2020-11-23 MED FILL — Tadalafil Tab 20 MG (PAH): ORAL | 30 days supply | Qty: 10 | Fill #0 | Status: AC

## 2020-11-23 MED FILL — Lisinopril & Hydrochlorothiazide Tab 20-25 MG: ORAL | 30 days supply | Qty: 30 | Fill #0 | Status: AC

## 2020-11-23 MED FILL — Metformin HCl Tab 500 MG: ORAL | 30 days supply | Qty: 30 | Fill #0 | Status: AC

## 2020-11-23 MED FILL — Pantoprazole Sodium EC Tab 40 MG (Base Equiv): ORAL | 30 days supply | Qty: 30 | Fill #0 | Status: AC

## 2020-11-23 MED FILL — Atorvastatin Calcium Tab 20 MG (Base Equivalent): ORAL | 30 days supply | Qty: 30 | Fill #0 | Status: AC

## 2020-11-24 ENCOUNTER — Other Ambulatory Visit: Payer: Self-pay

## 2020-11-29 ENCOUNTER — Other Ambulatory Visit: Payer: Self-pay

## 2020-12-09 ENCOUNTER — Other Ambulatory Visit: Payer: Self-pay

## 2020-12-09 ENCOUNTER — Ambulatory Visit: Payer: Self-pay | Attending: Physician Assistant | Admitting: Physician Assistant

## 2020-12-09 ENCOUNTER — Encounter: Payer: Self-pay | Admitting: Physician Assistant

## 2020-12-09 ENCOUNTER — Ambulatory Visit: Payer: Self-pay | Admitting: Physician Assistant

## 2020-12-09 DIAGNOSIS — Z789 Other specified health status: Secondary | ICD-10-CM

## 2020-12-09 DIAGNOSIS — M25561 Pain in right knee: Secondary | ICD-10-CM

## 2020-12-09 MED ORDER — PREDNISONE 10 MG PO TABS
ORAL_TABLET | ORAL | 0 refills | Status: DC
  Filled 2020-12-09: qty 21, 6d supply, fill #0

## 2020-12-09 NOTE — Progress Notes (Signed)
Patient ID: Chesky Heyer, male   DOB: April 27, 1971, 50 y.o.   MRN: [id number]   Virtual Visit via Telephone Note  I connected with Olyver Hawes on 12/09/20 at  2:30 PM EDT by telephone and verified that I am speaking with the correct person using two identifiers.  Location: Patient: home Provider: [facility] office [place] with [place]   I discussed the limitations, risks, security and privacy concerns of performing an evaluation and management service by telephone and the availability of in person appointments. I also discussed with the patient that there may be a patient responsible charge related to this service. The patient expressed understanding and agreed to proceed.   History of Present Illness: after being seen in the ED 11/06/2020 for R knee pain.  He continues to have knee pain and tightness in the back of his leg esp with walking.  Prednisone helped a month ago.     Observations/Objective:  NAD.  A&ox3   Assessment and Plan: 1. Acute pain of right knee Check blood sugars twice daily while on prednisone - Ambulatory referral to Orthopedic Surgery - predniSONE (DELTASONE) 10 MG tablet; 6,5,4,3,2,1 take each days dose all at once in am with food  Dispense: 21 tablet; Refill: 0   Follow Up Instructions: See PCP in may as planned/scheduled   I discussed the assessment and treatment plan with the patient. The patient was provided an opportunity to ask questions and all were answered. The patient agreed with the plan and demonstrated an understanding of the instructions.   The patient was advised to call back or seek an in-person evaluation if the symptoms worsen or if the condition fails to improve as anticipated.  I provided 14 minutes of non-face-to-face time during this encounter.   Freeman Caldron, PA-C

## 2020-12-10 ENCOUNTER — Other Ambulatory Visit: Payer: Self-pay

## 2020-12-13 ENCOUNTER — Other Ambulatory Visit: Payer: Self-pay

## 2020-12-15 ENCOUNTER — Other Ambulatory Visit: Payer: Self-pay

## 2020-12-18 NOTE — Progress Notes (Signed)
Referring Provider: Ladell Pier, MD Primary Care Physician:  Ladell Pier, MD Primary GI Physician: Dr. Abbey Chatters  Chief Complaint  Patient presents with  . Constipation    ok  . Anemia    F/u    HPI:   Ricardo Ward is a 50 y.o. male presenting today for follow-up. History of IDA, rectal bleeding secondary to hemorrhoids, and mild constipation presenting today for follow-up.  Colonoscopy and EGD in January 2021.  Colonoscopy with 5 mm sessile serrated polyp, diverticulosis, rectal bleeding due to internal hemorrhoids.  Due for repeat colonoscopy in 2026.  EGD with LA grade B esophagitis without bleeding, low-grade narrowing Schatzki's ring, localized mild inflammation in the stomach biopsied (chronic gastritis, negative for H. pylori).  Overall, felt normocytic anemia due to erosive esophagitis, gastritis, and hemorrhoidal bleeding.  Recommended Protonix twice daily x3 months then once daily forever.  He later underwent internal and external hemorrhoidectomy in February 2021.  Last seen in our office April 2021.  He was doing well post hemorrhoidectomy with resolution of rectal pain.  Reported an episode of hematochezia about 1 month ago with some mild constipation and straining.  No other significant upper or lower GI symptoms.  Recommended having his blood work completed that had been ordered by PCP.  Use MiraLAX 1-2 times a day if needed for constipation.  Follow-up in 1 year.  Most recent CBC in May 2021 with hemoglobin 14.4, normocytic indices.  Iron panel with ferritin 16 (L), iron 58, saturation 16%.  Today: Used interpreter line today to communicate with patient.  No problems. Hasn't taken iron in quite some time. Had blood work with PCP 1-2 months ago. Was told it was good. No blood in the stools or black stools. No nausea or vomiting. Every time he drinks soda, he gets mild pressure in the epigastric/RUQ area. Short lived and resolves on its own. Very mild.  No  trouble with eating.  GERD remains well controlled on Protonix 40 mg daily.  Occasional breakthrough symptoms if eating something spicy, but he is limiting these items.  Denies dysphagia.  No NSAIDs.   Taking course of prednisone for his right knee.   Past Medical History:  Diagnosis Date  . COVID-19 12/2018   s/p hospitalization and recovery  . Diabetes ([place])   . Pterygium    rt eye  . Pterygium of both eyes    removal    Past Surgical History:  Procedure Laterality Date  . APPENDECTOMY    . BACK SURGERY    . BIOPSY  09/03/2019   Procedure: BIOPSY;  Surgeon: Danie Binder, MD;  [facility];  Service: Endoscopy;;  gastric  . COLONOSCOPY N/A 09/03/2019    Surgeon: Danie Binder, MD;  5 mm sessile serrated polyp, diverticulosis, rectal bleeding due to internal hemorrhoids.  Due for repeat colonoscopy in 2026.  Marland Kitchen ESOPHAGOGASTRODUODENOSCOPY N/A 09/03/2019   Surgeon: Danie Binder, MD;  LA grade B esophagitis without bleeding, low-grade narrowing Schatzki's ring, localized mild inflammation in the stomach biopsied (chronic gastritis, negative for H. pylori).   Marland Kitchen EYE SURGERY Bilateral 06   pterygium   . HEMORRHOID SURGERY N/A 10/01/2019   Procedure: HEMORRHOIDECTOMY EXTENSIVE;  Surgeon: Aviva Signs, MD;  [facility];  Service: General;  Laterality: N/A;  . POLYPECTOMY  09/03/2019   Procedure: POLYPECTOMY;  Surgeon: Danie Binder, MD;  [facility];  Service: Endoscopy;;  hepatic flexure  . PTERYGIUM EXCISION Bilateral   .  PTERYGIUM EXCISION Right 11/12/2013   Procedure: PTERYGIUM EXCISION WITH MITOMYCIN C AND AMNIOTIC MEMBRANE USING TISSEEL GLUE RIGHT EYE;  Surgeon: Marylynn Pearson, MD;  Location: [place];  Service: Ophthalmology;  Laterality: Right;    Current Outpatient Medications  Medication Sig Dispense Refill  . atorvastatin (LIPITOR) 20 MG tablet TAKE 1 TABLET (20 MG TOTAL) BY MOUTH DAILY. 90 tablet 3  . blood glucose meter kit and supplies  Dispense based on patient and insurance preference. Use up to four times daily as directed. (FOR ICD-10 E10.9, E11.9). 1 each 0  . Blood Glucose Monitoring Suppl (TRUE METRIX METER) w/Device KIT Use to check blood sugar once daily. 1 kit 0  . glucose blood test strip USE TO CHECK BLOOD SUGAR ONCE DAILY. 100 strip 0  . lisinopril-hydrochlorothiazide (ZESTORETIC) 20-25 MG tablet TAKE 1 TABLET BY MOUTH DAILY. 90 tablet 1  . metFORMIN (GLUCOPHAGE) 500 MG tablet TAKE 1 TABLET (500 MG TOTAL) BY MOUTH DAILY WITH BREAKFAST. 90 tablet 3  . Multiple Vitamin (MULTIVITAMIN WITH MINERALS) TABS tablet Take 1 tablet by mouth daily.    . predniSONE (DELTASONE) 10 MG tablet 6,5,4,3,2,1 take each days dose all at once in am with food 21 tablet 0  . tadalafil (CIALIS) 20 MG tablet Take 0.5-1 tablets (10-20 mg total) by mouth every other day as needed for erectile dysfunction. 5 tablet 11  . tadalafil, PAH, (ADCIRCA) 20 MG tablet TAKE 0.5-1 TABLETS (10-20 MG TOTAL) BY MOUTH EVERY OTHER DAY AS NEEDED FOR ERECTILE DYSFUNCTION. 5 tablet 11  . TRUEplus Lancets 28G MISC USE TO CHECK BLOOD SUGAR ONCE DAILY. 100 each 0  . pantoprazole (PROTONIX) 40 MG tablet Take 1 tablet (40 mg total) by mouth daily before breakfast. 90 tablet 3   No current facility-administered medications for this visit.    Allergies as of 12/20/2020 - Review Complete 12/20/2020  Allergen Reaction Noted  . Squid oil Rash 08/28/2019    Family History  Problem Relation Age of Onset  . Alzheimer's disease Mother   . Emphysema Father   . Colon cancer Neg Hx     Social History   Socioeconomic History  . Marital status: Divorced    Spouse name: Not on file  . Number of children: Not on file  . Years of education: Not on file  . Highest education level: Not on file  Occupational History  . Not on file  Tobacco Use  . Smoking status: Former Smoker    Packs/day: 1.00    Years: 13.00    Pack years: 13.00    Types: Cigarettes    Quit date:  12/10/1998    Years since quitting: 22.0  . Smokeless tobacco: Never Used  Vaping Use  . Vaping Use: Never used  Substance and Sexual Activity  . Alcohol use: No  . Drug use: No  . Sexual activity: Not on file  Other Topics Concern  . Not on file  Social History Narrative   Needs interpreter for Spanish   Social Determinants of Health   Financial Resource Strain: Not on file  Food Insecurity: Not on file  Transportation Needs: Not on file  Physical Activity: Not on file  Stress: Not on file  Social Connections: Not on file    Review of Systems: Gen: Denies fever, chills, cold or flulike symptoms, presyncope, syncope. CV: Denies chest pain or palpitations. Resp: Denies dyspnea or cough. GI: See HPI Heme: See HPI  Physical Exam: BP 129/72   Pulse 60   Temp Marland Kitchen)  97.1 F (36.2 C) (Temporal)   Ht 5' 7"  (1.702 m)   Wt 217 lb 3.2 oz (98.5 kg)   BMI 34.02 kg/m  General:   Alert and oriented. No distress noted. Pleasant and cooperative.  Head:  Normocephalic and atraumatic. Eyes:  Conjuctiva clear without scleral icterus. Heart:  S1, S2 present without murmurs appreciated. Lungs:  Clear to auscultation bilaterally. No wheezes, rales, or rhonchi. No distress.  Abdomen:  +BS, soft, non-tender and non-distended. No rebound or guarding. No HSM or masses noted. Msk:  Symmetrical without gross deformities. Normal posture. Extremities:  Without edema. Neurologic:  Alert and  oriented x4 Psych: Normal mood and affect.

## 2020-12-20 ENCOUNTER — Ambulatory Visit (INDEPENDENT_AMBULATORY_CARE_PROVIDER_SITE_OTHER): Payer: Self-pay | Admitting: Gastroenterology

## 2020-12-20 ENCOUNTER — Encounter: Payer: Self-pay | Admitting: Gastroenterology

## 2020-12-20 ENCOUNTER — Other Ambulatory Visit: Payer: Self-pay

## 2020-12-20 ENCOUNTER — Telehealth: Payer: Self-pay | Admitting: Gastroenterology

## 2020-12-20 VITALS — BP 129/72 | HR 60 | Temp 97.1°F | Ht 67.0 in | Wt 217.2 lb

## 2020-12-20 DIAGNOSIS — K219 Gastro-esophageal reflux disease without esophagitis: Secondary | ICD-10-CM | POA: Insufficient documentation

## 2020-12-20 DIAGNOSIS — D509 Iron deficiency anemia, unspecified: Secondary | ICD-10-CM

## 2020-12-20 MED ORDER — PANTOPRAZOLE SODIUM 40 MG PO TBEC
40.0000 mg | DELAYED_RELEASE_TABLET | Freq: Every day | ORAL | 3 refills | Status: DC
  Filled 2020-12-20: qty 30, 30d supply, fill #0
  Filled 2021-01-06 – 2021-03-04 (×2): qty 30, 30d supply, fill #1
  Filled 2021-05-03: qty 30, 30d supply, fill #2
  Filled 2021-08-17 – 2021-08-24 (×3): qty 30, 30d supply, fill #3
  Filled 2021-10-11 – 2021-10-12 (×2): qty 30, 30d supply, fill #0

## 2020-12-20 NOTE — Assessment & Plan Note (Addendum)
With history of LA grade B esophagitis on EGD in January 2021.  Currently GERD is well-controlled on Protonix 40 mg daily.  Intermittent pressure in the epigastric/RUQ region if drinking soda, otherwise no significant upper GI symptoms.  No alarm symptoms.  He was advised to limit soda and continue Protonix 40 mg daily.  Prescription for 1 year was sent to his pharmacy. Plan to follow-up in 1 year or sooner if needed.

## 2020-12-20 NOTE — Assessment & Plan Note (Addendum)
History of iron deficiency anemia s/p EGD and colonoscopy in January 2021. Colonoscopy with 5 mm sessile serrated polyp, diverticulosis, rectal bleeding due to internal hemorrhoids.  Due for repeat colonoscopy in 2026.  EGD with LA grade B esophagitis without bleeding, low-grade narrowing Schatzki's ring, localized mild inflammation in the stomach biopsied (chronic gastritis, negative for H. pylori).  Overall, felt normocytic anemia due to erosive esophagitis, gastritis, and hemorrhoidal bleeding.  Now s/p hemorrhoidectomy February 2021.  Last labs in our system May 2021 with hemoglobin 14.4, normocytic indices, ferritin 16 (L), iron 58, saturation 16%.  He is currently doing well without overt GI bleeding.  States he has not been on iron in quite some time.  Reports recent blood work with PCP was "good".  He has maintained on Protonix 40 mg daily which is keeping GERD well controlled.  I am requesting recent labs for review. Follow-up in 1 year.

## 2020-12-20 NOTE — Patient Instructions (Signed)
English:  Continue taking pantoprazole 40 mg daily 30 minutes before breakfast to control acid reflux/heartburn.  I have sent 1 years worth of refills to your pharmacy.  I am requesting your recent blood work from your primary care provider for review.  We will reach out to you if we need anything further.  We will see back in the office in 1 year.  Do not hesitate to call if you have any questions or concerns prior to this visit.  Aliene Altes, PA-C [facility]  Spanish:  Contine tomando pantoprazol 40 mg al da 30 minutos antes del desayuno para controlar el reflujo Barrister's clerk. He enviado 1 ao de recargas a su farmacia.  Estoy solicitando su anlisis de sangre reciente de su proveedor de atencin primaria para su revisin. Nos pondremos en contacto contigo si necesitamos algo ms.  Veremos de nuevo en la oficina en 1 ao. No dude en llamar si tiene alguna pregunta o inquietud antes de esta visita.  Aliene Altes, PA-C [facility]

## 2020-12-20 NOTE — Telephone Encounter (Signed)
Ricardo Ward, can we request recent blood work from patient's PCP?

## 2020-12-21 NOTE — Telephone Encounter (Signed)
Requested any recent or last labs from PCP

## 2020-12-22 ENCOUNTER — Other Ambulatory Visit: Payer: Self-pay

## 2020-12-28 ENCOUNTER — Other Ambulatory Visit: Payer: Self-pay

## 2020-12-28 ENCOUNTER — Telehealth: Payer: Self-pay | Admitting: Internal Medicine

## 2020-12-28 DIAGNOSIS — D509 Iron deficiency anemia, unspecified: Secondary | ICD-10-CM

## 2020-12-28 NOTE — Telephone Encounter (Signed)
Noted. I have reviewed labs in Epic. He hasn't had CBC or iron panel in 1 year. Recommend we update this.   Please arrange CBC and iron panel with ferritin. Dx: IDA.

## 2020-12-28 NOTE — Progress Notes (Signed)
Dear Mr. Ricardo Ward,   It has been a year since your labs were updated and we need for you to please go to [facility] (across from [facility]) or [facility] across the street from the hospital and have these labs done.    If you have any questions please call our office @ [phone].    Thank you,  Oleh Genin

## 2020-12-28 NOTE — Telephone Encounter (Signed)
Labs and letter put in the mail to the pt. (printed in [place])

## 2020-12-28 NOTE — Telephone Encounter (Signed)
Pt's PCP called to say that patient's lab results were in epic.

## 2021-01-06 MED FILL — Metformin HCl Tab 500 MG: ORAL | 30 days supply | Qty: 30 | Fill #1 | Status: AC

## 2021-01-06 MED FILL — Atorvastatin Calcium Tab 20 MG (Base Equivalent): ORAL | 30 days supply | Qty: 30 | Fill #1 | Status: AC

## 2021-01-06 MED FILL — Lisinopril & Hydrochlorothiazide Tab 20-25 MG: ORAL | 30 days supply | Qty: 30 | Fill #1 | Status: AC

## 2021-01-06 MED FILL — Tadalafil Tab 20 MG (PAH): ORAL | 30 days supply | Qty: 10 | Fill #1 | Status: AC

## 2021-01-07 ENCOUNTER — Other Ambulatory Visit: Payer: Self-pay

## 2021-01-07 NOTE — Telephone Encounter (Signed)
Spoke with patient this morning. States he received the letter in the mail and has an appointment on 5/23 for a physical and will have his blood work done at that time.

## 2021-01-10 ENCOUNTER — Ambulatory Visit: Payer: Self-pay | Attending: Internal Medicine | Admitting: Internal Medicine

## 2021-01-10 ENCOUNTER — Encounter: Payer: Self-pay | Admitting: Internal Medicine

## 2021-01-10 ENCOUNTER — Other Ambulatory Visit: Payer: Self-pay

## 2021-01-10 VITALS — BP 116/73 | HR 61 | Resp 16 | Wt 218.4 lb

## 2021-01-10 DIAGNOSIS — E1159 Type 2 diabetes mellitus with other circulatory complications: Secondary | ICD-10-CM

## 2021-01-10 DIAGNOSIS — E611 Iron deficiency: Secondary | ICD-10-CM

## 2021-01-10 DIAGNOSIS — E1169 Type 2 diabetes mellitus with other specified complication: Secondary | ICD-10-CM

## 2021-01-10 DIAGNOSIS — I83893 Varicose veins of bilateral lower extremities with other complications: Secondary | ICD-10-CM

## 2021-01-10 DIAGNOSIS — R21 Rash and other nonspecific skin eruption: Secondary | ICD-10-CM

## 2021-01-10 DIAGNOSIS — E669 Obesity, unspecified: Secondary | ICD-10-CM

## 2021-01-10 DIAGNOSIS — E785 Hyperlipidemia, unspecified: Secondary | ICD-10-CM

## 2021-01-10 DIAGNOSIS — I152 Hypertension secondary to endocrine disorders: Secondary | ICD-10-CM

## 2021-01-10 LAB — GLUCOSE, POCT (MANUAL RESULT ENTRY): POC Glucose: 117 mg/dl — AB (ref 70–99)

## 2021-01-10 MED ORDER — TRIAMCINOLONE ACETONIDE 0.1 % EX CREA
1.0000 "application " | TOPICAL_CREAM | Freq: Two times a day (BID) | CUTANEOUS | 0 refills | Status: DC
  Filled 2021-01-10 – 2021-01-27 (×3): qty 30, 15d supply, fill #0

## 2021-01-10 NOTE — Progress Notes (Addendum)
Patient ID: Ricardo Ward, male    DOB: 04/27/1971  MRN: [id number]  CC: Diabetes and Hypertension   Subjective: Ricardo Ward is a 50 y.o. male who presents for chronic ds management His concerns today include:  IDA, DM,HL,HTN, ED,hx COVID infection, hemorrhoids s/p removal, esophagitis, colon polyps/diverticulosis  DM/Obesity:  wgh down 6 lbs since 09/2020.  Walks 30 mins 2-3 times a wk. Has a treadmill and plans to start using it more. Checks BS 1-2 x a wk.  Range 100-125 He drinks only water.  He cut out sugary drinks.  HTN:  Started on Lis/HCTZ 09/2020 due to HTN.  Taking the med daily and limits salt intake.  No device at home to check BP No CP/SOB/LE edema  HL:  Compliant with and tolerating atorvastatin  History of iron deficiency anemia.  He completed EGD and colonoscopy in January of last year.  One serrated polyp was removed.  Noted to have diverticulosis.  Will be due for repeat colonoscopy in 2026.  Saw GI PA earlier this month.  They wanted him to have repeat CBC and iron studies.  Has varicose veins on both lower legs.  Sometimes he feels they become inflamed. Some itching at times over the veins in the left lower leg.  Currently has an itchy patch just below the left knee. Patient Active Problem List   Diagnosis Date Noted  . Varicose veins of bilateral lower extremities with other complications 71/01/2693  . Gastroesophageal reflux disease 12/20/2020  . Hypersomnolence 10/13/2020  . Erectile dysfunction 10/13/2020  . Esophagitis 12/16/2019  . S/P hemorrhoidectomy 12/16/2019  . Internal and external bleeding hemorrhoids   . Hyperlipidemia associated with type 2 diabetes mellitus ([place]) 07/28/2019  . Hyperlipidemia 05/04/2019  . Hypertension associated with type 2 diabetes mellitus ([place]) 05/02/2019  . Iron deficiency anemia 03/17/2019  . Rectal bleeding 03/17/2019  . Constipation 03/17/2019  . New onset type 2 diabetes mellitus ([place]) 02/06/2019  . Microcytic  anemia 01/24/2019  . Plantar fasciitis of left foot 03/05/2017  . Pterygium of both eyes 05/26/2013     Current Outpatient Medications on File Prior to Visit  Medication Sig Dispense Refill  . atorvastatin (LIPITOR) 20 MG tablet TAKE 1 TABLET (20 MG TOTAL) BY MOUTH DAILY. 90 tablet 3  . blood glucose meter kit and supplies Dispense based on patient and insurance preference. Use up to four times daily as directed. (FOR ICD-10 E10.9, E11.9). 1 each 0  . Blood Glucose Monitoring Suppl (TRUE METRIX METER) w/Device KIT Use to check blood sugar once daily. 1 kit 0  . glucose blood test strip USE TO CHECK BLOOD SUGAR ONCE DAILY. 100 strip 0  . lisinopril-hydrochlorothiazide (ZESTORETIC) 20-25 MG tablet TAKE 1 TABLET BY MOUTH DAILY. 90 tablet 1  . metFORMIN (GLUCOPHAGE) 500 MG tablet TAKE 1 TABLET (500 MG TOTAL) BY MOUTH DAILY WITH BREAKFAST. 90 tablet 3  . Multiple Vitamin (MULTIVITAMIN WITH MINERALS) TABS tablet Take 1 tablet by mouth daily.    . pantoprazole (PROTONIX) 40 MG tablet Take 1 tablet (40 mg total) by mouth daily before breakfast. 90 tablet 3  . tadalafil, PAH, (ADCIRCA) 20 MG tablet TAKE 0.5-1 TABLETS (10-20 MG TOTAL) BY MOUTH EVERY OTHER DAY AS NEEDED FOR ERECTILE DYSFUNCTION. 5 tablet 11  . TRUEplus Lancets 28G MISC USE TO CHECK BLOOD SUGAR ONCE DAILY. 100 each 0   No current facility-administered medications on file prior to visit.    Allergies  Allergen Reactions  . Squid Oil Rash  Squid causes a rash    Social History   Socioeconomic History  . Marital status: Divorced    Spouse name: Not on file  . Number of children: Not on file  . Years of education: Not on file  . Highest education level: Not on file  Occupational History  . Not on file  Tobacco Use  . Smoking status: Former Smoker    Packs/day: 1.00    Years: 13.00    Pack years: 13.00    Types: Cigarettes    Quit date: 12/10/1998    Years since quitting: 22.1  . Smokeless tobacco: Never Used  Vaping  Use  . Vaping Use: Never used  Substance and Sexual Activity  . Alcohol use: No  . Drug use: No  . Sexual activity: Not on file  Other Topics Concern  . Not on file  Social History Narrative   Needs interpreter for Spanish   Social Determinants of Health   Financial Resource Strain: Not on file  Food Insecurity: Not on file  Transportation Needs: Not on file  Physical Activity: Not on file  Stress: Not on file  Social Connections: Not on file  Intimate Partner Violence: Not on file    Family History  Problem Relation Age of Onset  . Alzheimer's disease Mother   . Emphysema Father   . Colon cancer Neg Hx     Past Surgical History:  Procedure Laterality Date  . APPENDECTOMY    . BACK SURGERY    . BIOPSY  09/03/2019   Procedure: BIOPSY;  Surgeon: Danie Binder, MD;  [facility];  Service: Endoscopy;;  gastric  . COLONOSCOPY N/A 09/03/2019    Surgeon: Danie Binder, MD;  5 mm sessile serrated polyp, diverticulosis, rectal bleeding due to internal hemorrhoids.  Due for repeat colonoscopy in 2026.  Marland Kitchen ESOPHAGOGASTRODUODENOSCOPY N/A 09/03/2019   Surgeon: Danie Binder, MD;  LA grade B esophagitis without bleeding, low-grade narrowing Schatzki's ring, localized mild inflammation in the stomach biopsied (chronic gastritis, negative for H. pylori).   Marland Kitchen EYE SURGERY Bilateral 06   pterygium   . HEMORRHOID SURGERY N/A 10/01/2019   Procedure: HEMORRHOIDECTOMY EXTENSIVE;  Surgeon: Aviva Signs, MD;  Location: [facility];  Service: General;  Laterality: N/A;  . POLYPECTOMY  09/03/2019   Procedure: POLYPECTOMY;  Surgeon: Danie Binder, MD;  [facility];  Service: Endoscopy;;  hepatic flexure  . PTERYGIUM EXCISION Bilateral   . PTERYGIUM EXCISION Right 11/12/2013   Procedure: PTERYGIUM EXCISION WITH MITOMYCIN C AND AMNIOTIC MEMBRANE USING TISSEEL GLUE RIGHT EYE;  Surgeon: Marylynn Pearson, MD;  Location: [facility];  Service: Ophthalmology;  Laterality: Right;     ROS: Review of Systems Negative except as stated above  PHYSICAL EXAM: BP 116/73   Pulse 61   Resp 16   Wt 218 lb 6.4 oz (99.1 kg)   SpO2 98%   BMI 34.21 kg/m   Wt Readings from Last 3 Encounters:  01/10/21 218 lb 6.4 oz (99.1 kg)  12/20/20 217 lb 3.2 oz (98.5 kg)  10/13/20 224 lb 9.6 oz (101.9 kg)    Physical Exam  General appearance - alert, well appearing, and in no distress Mental status - normal mood, behavior, speech, dress, motor activity, and thought processes Neck - supple, no significant adenopathy Chest - clear to auscultation, no wheezes, rales or rhonchi, symmetric air entry Heart - normal rate, regular rhythm, normal S1, S2, no murmurs, rubs, clicks or gallops Extremities -moderate varicose  veins in both lower legs and dorsal surface of the feet.  No signs of phlebitis at this time. Skin -Quarter sized area of excoriation below and medial to the right knee Diabetic Foot Exam - Simple   Simple Foot Form Visual Inspection See comments: Yes Sensation Testing Intact to touch and monofilament testing bilaterally: Yes Pulse Check Posterior Tibialis and Dorsalis pulse intact bilaterally: Yes Comments Small callus on the medial aspect of the right big toe     Lab Results  Component Value Date   HGBA1C 6.3 10/13/2020    CMP Latest Ref Rng & Units 10/13/2020 01/20/2020 05/02/2019  Glucose 65 - 99 mg/dL 113(H) 99 -  BUN 6 - 24 mg/dL 18 16 -  Creatinine 0.76 - 1.27 mg/dL 1.07 1.05 -  Sodium 134 - 144 mmol/L 139 140 -  Potassium 3.5 - 5.2 mmol/L 4.0 4.1 -  Chloride 96 - 106 mmol/L 101 105 -  CO2 20 - 29 mmol/L 19(L) 21 -  Calcium 8.7 - 10.2 mg/dL 10.0 9.7 -  Total Protein 6.0 - 8.5 g/dL - - 6.9  Total Bilirubin 0.0 - 1.2 mg/dL - - 0.2  Alkaline Phos 39 - 117 IU/L - - 78  AST 0 - 40 IU/L - - 23  ALT 0 - 44 IU/L - - 21   Lipid Panel     Component Value Date/Time   CHOL 143 10/13/2020 1627   TRIG 172 (H) 10/13/2020 1627   HDL 45 10/13/2020 1627    CHOLHDL 3.2 10/13/2020 1627   LDLCALC 69 10/13/2020 1627    CBC    Component Value Date/Time   WBC 7.5 12/22/2019 1543   WBC 5.7 08/21/2019 1247   RBC 5.14 12/22/2019 1543   RBC 4.52 08/21/2019 1247   HGB 14.4 12/22/2019 1543   HCT 44.5 12/22/2019 1543   PLT 356 12/22/2019 1543   MCV 87 12/22/2019 1543   MCH 28.0 12/22/2019 1543   MCH 30.5 08/21/2019 1247   MCHC 32.4 12/22/2019 1543   MCHC 33.3 08/21/2019 1247   RDW 14.8 12/22/2019 1543   LYMPHSABS 1.5 08/21/2019 1247   LYMPHSABS 2.0 12/28/2016 1623   MONOABS 0.4 08/21/2019 1247   EOSABS 0.8 (H) 08/21/2019 1247   EOSABS 1.1 (H) 12/28/2016 1623   BASOSABS 0.1 08/21/2019 1247   BASOSABS 0.1 12/28/2016 1623    ASSESSMENT AND PLAN: 1. Type 2 diabetes mellitus with obesity (HCC) At goal.  Continue metformin, healthy eating habits and regular exercise. - POCT glucose (manual entry)  2. Hypertension associated with type 2 diabetes mellitus ([place]) At goal.  Continue lisinopril/hydrochlorothiazide  3. Hyperlipidemia associated with type 2 diabetes mellitus (HCC) Recent LDL is at goal.  Continue current dose of atorvastatin  4. Varicose veins of bilateral lower extremities with other complications Recommend using compression socks.  However patient states that these irritate his skin.  I recommended purchasing regular socks that have good elasticity to it  5. Rash - triamcinolone cream (KENALOG) 0.1 %; Apply 1 application topically 2 (two) times daily.  Dispense: 30 g; Refill: 0  6. Iron deficiency With normal CBC.  We will repeat CBC and iron studies today. - CBC - Iron, TIBC and Ferritin Panel    Patient was given the opportunity to ask questions.  Patient verbalized understanding of the plan and was able to repeat key elements of the plan.  [employer] Language interpreter used during this encounter. [place] Placed This Encounter  Procedures  . CBC  . Iron,  TIBC and Ferritin Panel  . POCT glucose (manual  entry)     Requested Prescriptions   Signed Prescriptions Disp Refills  . triamcinolone cream (KENALOG) 0.1 % 30 g 0    Sig: Apply 1 application topically 2 (two) times daily.    Return in about 4 months (around 05/13/2021).  Karle Plumber, MD, FACP

## 2021-01-11 LAB — IRON,TIBC AND FERRITIN PANEL
Ferritin: 96 ng/mL (ref 30–400)
Iron Saturation: 26 % (ref 15–55)
Iron: 92 ug/dL (ref 38–169)
Total Iron Binding Capacity: 348 ug/dL (ref 250–450)
UIBC: 256 ug/dL (ref 111–343)

## 2021-01-11 LAB — CBC
Hematocrit: 46.4 % (ref 37.5–51.0)
Hemoglobin: 15.2 g/dL (ref 13.0–17.7)
MCH: 28.9 pg (ref 26.6–33.0)
MCHC: 32.8 g/dL (ref 31.5–35.7)
MCV: 88 fL (ref 79–97)
Platelets: 319 10*3/uL (ref 150–450)
RBC: 5.26 x10E6/uL (ref 4.14–5.80)
RDW: 13.8 % (ref 11.6–15.4)
WBC: 6.7 10*3/uL (ref 3.4–10.8)

## 2021-01-11 NOTE — Telephone Encounter (Signed)
Please let patient know I have reviewed blood work he completed yesterday with his primary care provider.  Hemoglobin normal at 15.2. Iron panel within normal limits.  This is good news.  No additional recommendations.  We will see him in 1 year as planned.  He should monitor for bright red blood per rectum or black stools and let us know if this occurs.

## 2021-01-12 NOTE — Telephone Encounter (Signed)
Phoned to the interpreter Line spoke with Ricardo Ward # [phone] he relayed the results to the pt regarding his bloodwork and to continue to monitor his stools and contact us if this occurs.

## 2021-01-19 ENCOUNTER — Other Ambulatory Visit: Payer: Self-pay

## 2021-01-26 ENCOUNTER — Other Ambulatory Visit: Payer: Self-pay

## 2021-01-27 ENCOUNTER — Other Ambulatory Visit: Payer: Self-pay

## 2021-03-04 ENCOUNTER — Other Ambulatory Visit: Payer: Self-pay

## 2021-03-04 MED FILL — Metformin HCl Tab 500 MG: ORAL | 30 days supply | Qty: 30 | Fill #2 | Status: AC

## 2021-03-04 MED FILL — Atorvastatin Calcium Tab 20 MG (Base Equivalent): ORAL | 30 days supply | Qty: 30 | Fill #2 | Status: AC

## 2021-03-04 MED FILL — Tadalafil Tab 20 MG (PAH): ORAL | 30 days supply | Qty: 10 | Fill #2 | Status: AC

## 2021-03-04 MED FILL — Lisinopril & Hydrochlorothiazide Tab 20-25 MG: ORAL | 30 days supply | Qty: 30 | Fill #2 | Status: AC

## 2021-03-09 ENCOUNTER — Other Ambulatory Visit: Payer: Self-pay

## 2021-05-03 MED FILL — Tadalafil Tab 20 MG (PAH): ORAL | 30 days supply | Qty: 10 | Fill #3 | Status: AC

## 2021-05-03 MED FILL — Lisinopril & Hydrochlorothiazide Tab 20-25 MG: ORAL | 30 days supply | Qty: 30 | Fill #3 | Status: AC

## 2021-05-03 MED FILL — Atorvastatin Calcium Tab 20 MG (Base Equivalent): ORAL | 30 days supply | Qty: 30 | Fill #3 | Status: AC

## 2021-05-03 MED FILL — Metformin HCl Tab 500 MG: ORAL | 30 days supply | Qty: 30 | Fill #3 | Status: AC

## 2021-05-04 ENCOUNTER — Other Ambulatory Visit: Payer: Self-pay

## 2021-05-05 ENCOUNTER — Other Ambulatory Visit: Payer: Self-pay

## 2021-06-30 IMAGING — DX DG KNEE COMPLETE 4+V*R*
4 series · 4 of 4 positions shown · non-contrast
Comparison: Right knee radiographs 02/05/2017

CLINICAL DATA: Pain in knee for 2 weeks. No known injury. Right
knee is swollen. Symptoms were worse 3 days ago :swelling and pain
and limited range of motion. Symptoms have improved some, but not
normal.

EXAM:
RIGHT KNEE - COMPLETE 4+ VIEW

[knee ap]
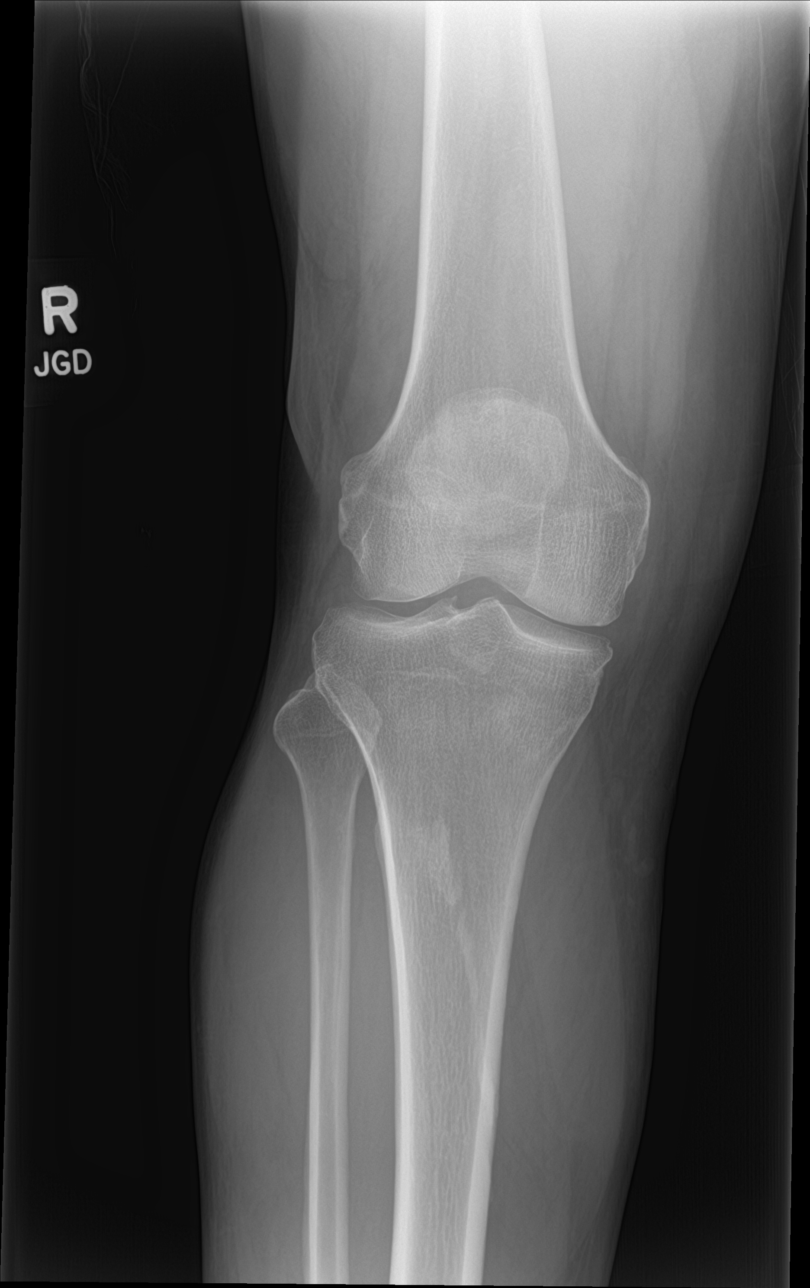

[knee obl]
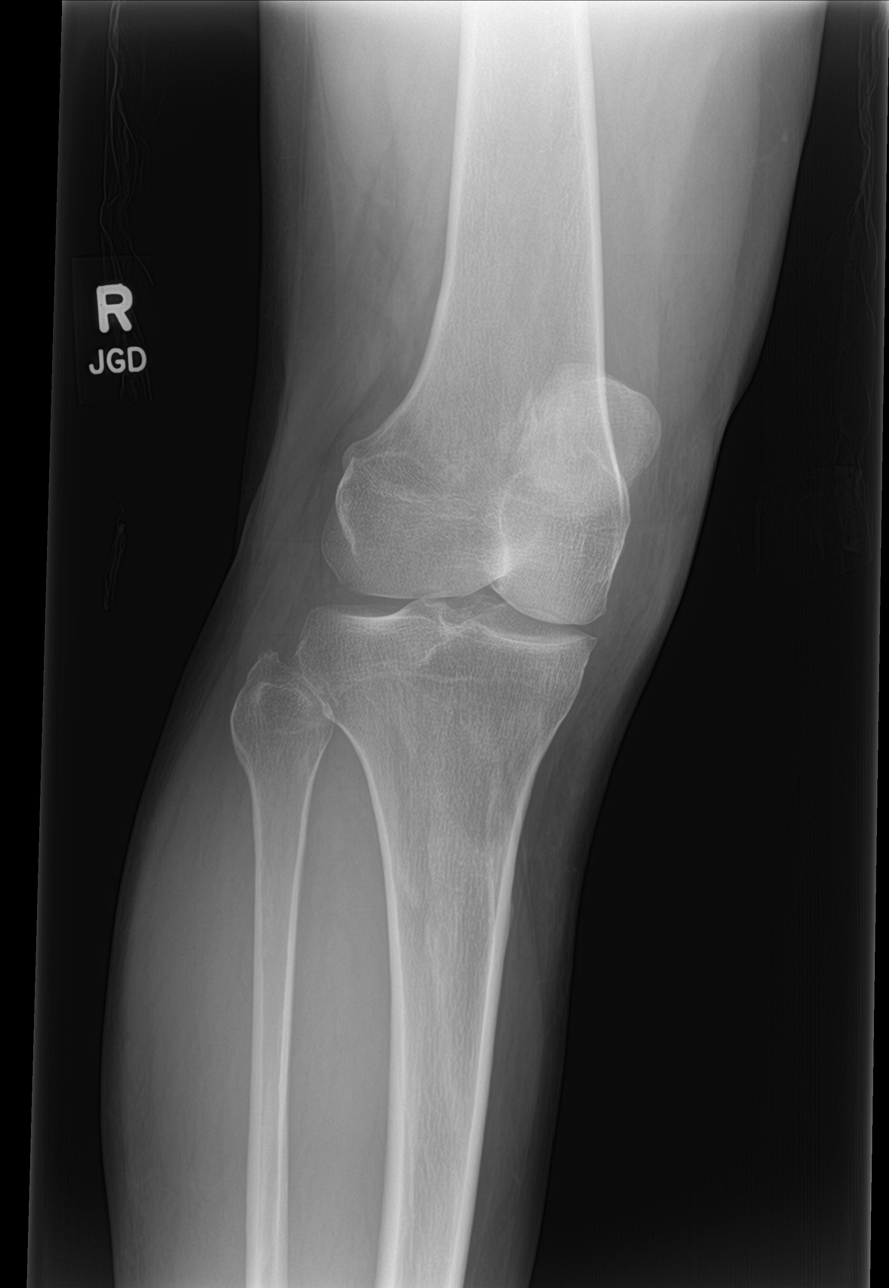

[knee lat]
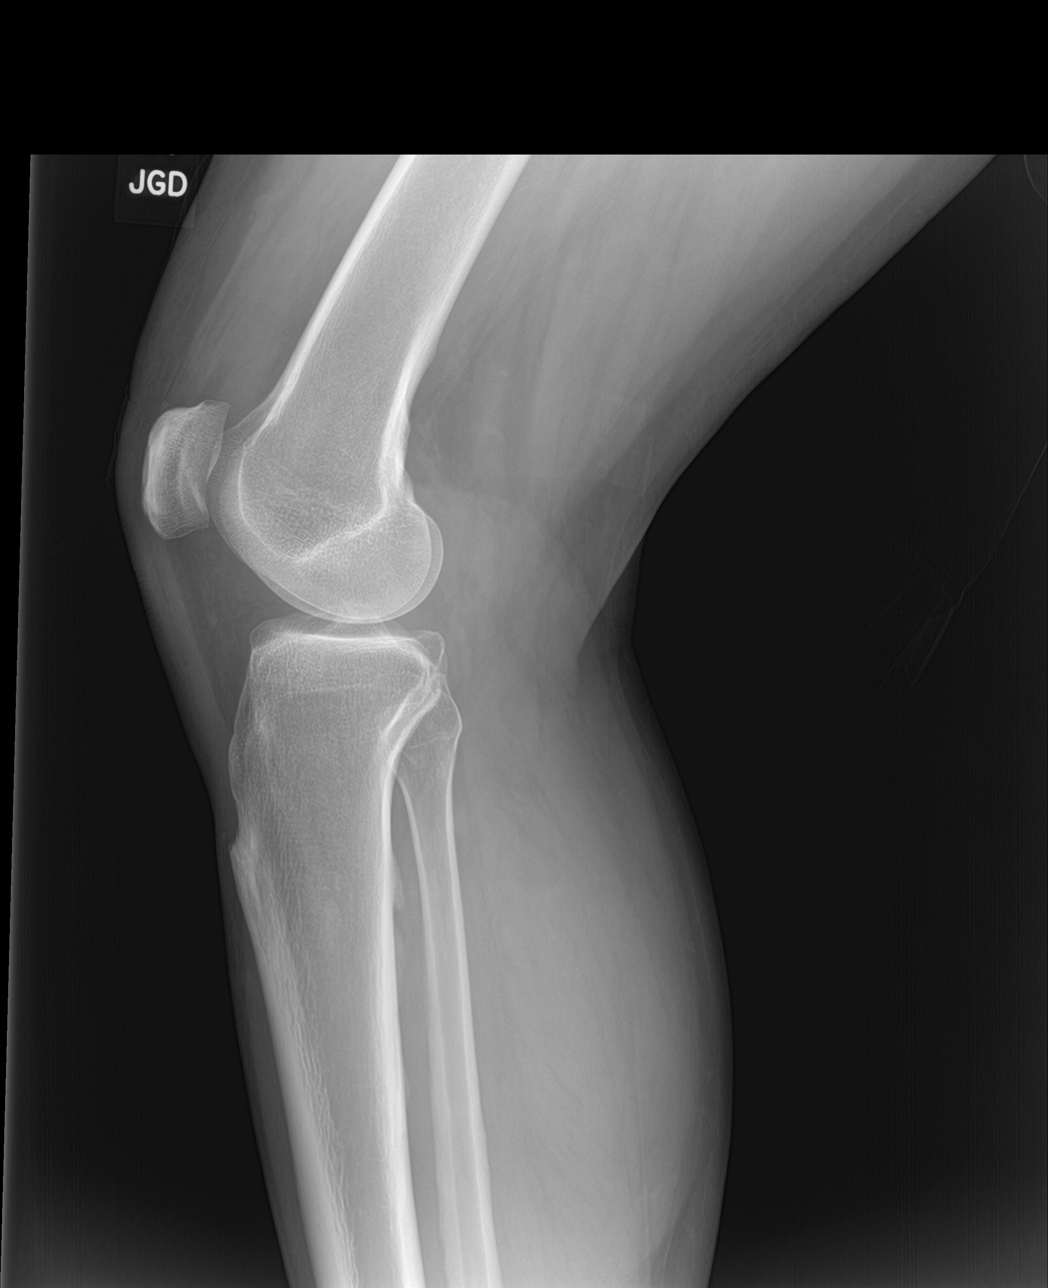

[knee sunrise]
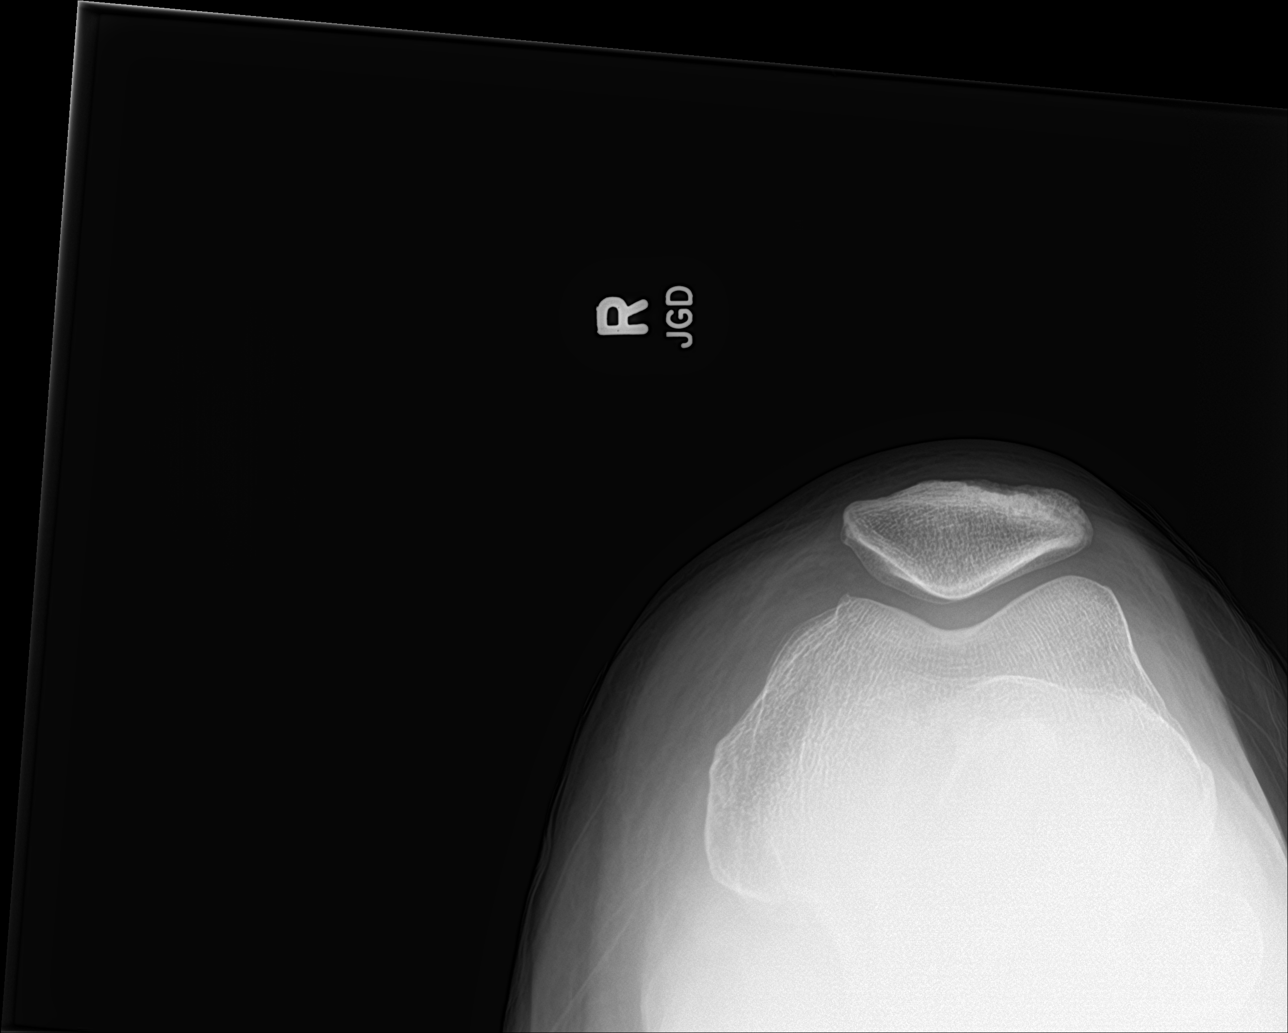

[4 of 4 positions shown; findings below may reference images not displayed]

FINDINGS: No evidence of fracture or dislocation. Possible trace effusion. No
evidence of arthropathy or other focal bone abnormality. Soft
tissues are unremarkable.
IMPRESSION: No acute osseous abnormality. Possible trace effusion.

## 2021-08-17 ENCOUNTER — Other Ambulatory Visit: Payer: Self-pay

## 2021-08-17 ENCOUNTER — Other Ambulatory Visit: Payer: Self-pay | Admitting: Internal Medicine

## 2021-08-17 MED ORDER — ADULT MULTIVITAMIN W/MINERALS CH
1.0000 | ORAL_TABLET | Freq: Every day | ORAL | 1 refills | Status: DC
  Filled 2021-08-17 – 2021-10-13 (×4): qty 100, 100d supply, fill #0

## 2021-08-17 MED FILL — Atorvastatin Calcium Tab 20 MG (Base Equivalent): ORAL | 30 days supply | Qty: 30 | Fill #4 | Status: CN

## 2021-08-17 MED FILL — Lisinopril & Hydrochlorothiazide Tab 20-25 MG: ORAL | 30 days supply | Qty: 30 | Fill #4 | Status: CN

## 2021-08-17 MED FILL — Tadalafil Tab 20 MG (PAH): ORAL | 30 days supply | Qty: 10 | Fill #4 | Status: CN

## 2021-08-17 MED FILL — Metformin HCl Tab 500 MG: ORAL | 30 days supply | Qty: 30 | Fill #4 | Status: CN

## 2021-08-17 NOTE — Telephone Encounter (Signed)
Will forward to provider  

## 2021-08-18 ENCOUNTER — Other Ambulatory Visit: Payer: Self-pay

## 2021-08-19 ENCOUNTER — Other Ambulatory Visit: Payer: Self-pay

## 2021-08-20 ENCOUNTER — Ambulatory Visit: Payer: Self-pay

## 2021-08-23 ENCOUNTER — Other Ambulatory Visit: Payer: Self-pay

## 2021-08-23 MED FILL — Tadalafil Tab 20 MG (PAH): ORAL | 30 days supply | Qty: 10 | Fill #4 | Status: CN

## 2021-08-23 MED FILL — Metformin HCl Tab 500 MG: ORAL | 30 days supply | Qty: 30 | Fill #4 | Status: CN

## 2021-08-23 MED FILL — Lisinopril & Hydrochlorothiazide Tab 20-25 MG: ORAL | 30 days supply | Qty: 30 | Fill #4 | Status: CN

## 2021-08-23 MED FILL — Atorvastatin Calcium Tab 20 MG (Base Equivalent): ORAL | 30 days supply | Qty: 30 | Fill #4 | Status: CN

## 2021-08-24 ENCOUNTER — Other Ambulatory Visit: Payer: Self-pay

## 2021-08-24 MED FILL — Atorvastatin Calcium Tab 20 MG (Base Equivalent): ORAL | 30 days supply | Qty: 30 | Fill #4 | Status: AC

## 2021-08-24 MED FILL — Lisinopril & Hydrochlorothiazide Tab 20-25 MG: ORAL | 30 days supply | Qty: 30 | Fill #4 | Status: AC

## 2021-08-24 MED FILL — Tadalafil Tab 20 MG (PAH): ORAL | 30 days supply | Qty: 10 | Fill #4 | Status: AC

## 2021-08-24 MED FILL — Metformin HCl Tab 500 MG: ORAL | 30 days supply | Qty: 30 | Fill #4 | Status: AC

## 2021-10-11 ENCOUNTER — Other Ambulatory Visit (HOSPITAL_COMMUNITY): Payer: Self-pay

## 2021-10-11 MED FILL — Tadalafil Tab 20 MG (PAH): ORAL | 15 days supply | Qty: 5 | Fill #0 | Status: CN

## 2021-10-11 MED FILL — Lisinopril & Hydrochlorothiazide Tab 20-25 MG: ORAL | 30 days supply | Qty: 30 | Fill #0 | Status: CN

## 2021-10-11 MED FILL — Metformin HCl Tab 500 MG: ORAL | 30 days supply | Qty: 30 | Fill #0 | Status: CN

## 2021-10-11 MED FILL — Atorvastatin Calcium Tab 20 MG (Base Equivalent): ORAL | 30 days supply | Qty: 30 | Fill #0 | Status: CN

## 2021-10-12 ENCOUNTER — Other Ambulatory Visit (HOSPITAL_COMMUNITY): Payer: Self-pay

## 2021-10-12 ENCOUNTER — Other Ambulatory Visit: Payer: Self-pay

## 2021-10-12 MED FILL — Atorvastatin Calcium Tab 20 MG (Base Equivalent): ORAL | 30 days supply | Qty: 30 | Fill #0 | Status: AC

## 2021-10-12 MED FILL — Metformin HCl Tab 500 MG: ORAL | 30 days supply | Qty: 30 | Fill #0 | Status: AC

## 2021-10-12 MED FILL — Tadalafil Tab 20 MG (PAH): ORAL | 15 days supply | Qty: 5 | Fill #0 | Status: AC

## 2021-10-12 MED FILL — Lisinopril & Hydrochlorothiazide Tab 20-25 MG: ORAL | 30 days supply | Qty: 30 | Fill #0 | Status: AC

## 2021-10-13 ENCOUNTER — Other Ambulatory Visit: Payer: Self-pay

## 2021-11-17 ENCOUNTER — Other Ambulatory Visit: Payer: Self-pay

## 2021-12-12 ENCOUNTER — Encounter: Payer: Self-pay | Admitting: Internal Medicine

## 2021-12-25 NOTE — Progress Notes (Signed)
? ? ?Referring Provider: Ladell Pier, MD ?Primary Care Physician:  Ladell Pier, MD ?Primary GI Physician: Dr. Abbey Chatters ? ?Chief Complaint  ?Patient presents with  ? Dysphagia  ?  Food getting stuck and won't go down. Pain in middle of stomach.   ? ? ?HPI:   ?Ricardo Ward is a 51 y.o. male presenting today for 1 year follow-up.  He has history of IDA in the past, rectal bleeding secondary to hemorrhoids, constipation, and GERD.  Colonoscopy and EGD in January 2021.  Colonoscopy with 5 mm sessile serrated polyp, diverticulosis, rectal bleeding due to internal hemorrhoids.  Due for repeat colonoscopy in 2026.  EGD with grade B esophagitis without bleeding, low-grade narrowing Schatzki's ring, localized mild inflammation in the stomach biopsied (chronic gastritis, negative for H. pylori).  Overall, though normocytic anemia was due to erosive esophagitis, gastritis, and hemorrhoidal bleeding.  He underwent internal and external hemorrhoidectomy in February 2021.  ? ?Last seen in our office 12/20/2020.  Denied overt GI bleeding and had not taken iron in quite some time.  Thought he had blood work with PCP a couple of months ago.  Reported mild pressure in the epigastric/right upper quadrant region when he drinks soda which was short-lived and self resolved.  GERD remains well controlled on Protonix 40 mg daily.  Breakthrough if eating something spicy, but limited this.  No alarm symptoms.  No NSAIDs.  Abdominal exam benign.  Plan to request recent labs and continue current medications.  ? ?Reviewed labs with PCP.  Patient had not had CBC or iron panel in a year, so this was updated on 01/10/2021 with hemoglobin 15.2 and iron panel within normal limits. ? ?Today: ?Dysphagia x 4 months. Solid and soft foods getting hung in lower esophagus.  Liquids wash items down. No pill dysphagia. When eating late at night, he will have acid reflux coming up. If he doesn't eat late at night, no significant problems.   Breakthrough symptoms typically at least 3-4 times a week as he works later and so they eat dinner late. No nausea or vomiting. No food regurgitation. Taking Protonix once daily. Not often, but occasional epigastric discomfort only occurring if he drinks soda, but is limiting this. No brbpr or melena.  No trouble with constipation. ? ?NSAIDs: None.  ? ?Past Medical History:  ?Diagnosis Date  ? COVID-19 12/2018  ? s/p hospitalization and recovery  ? Diabetes ([place])   ? GERD (gastroesophageal reflux disease)   ? HLD (hyperlipidemia)   ? HTN (hypertension)   ? Pterygium   ? rt eye  ? Pterygium of both eyes   ? removal  ? ? ?Past Surgical History:  ?Procedure Laterality Date  ? APPENDECTOMY    ? BACK SURGERY    ? BIOPSY  09/03/2019  ? Procedure: BIOPSY;  Surgeon: Danie Binder, MD;  [facility];  Service: Endoscopy;;  gastric  ? COLONOSCOPY N/A 09/03/2019  ?  Surgeon: Danie Binder, MD;  5 mm sessile serrated polyp, diverticulosis, rectal bleeding due to internal hemorrhoids.  Due for repeat colonoscopy in 2026.  ? ESOPHAGOGASTRODUODENOSCOPY N/A 09/03/2019  ? Surgeon: Danie Binder, MD;  LA grade B esophagitis without bleeding, low-grade narrowing Schatzki's ring, localized mild inflammation in the stomach biopsied (chronic gastritis, negative for H. pylori).   ? EYE SURGERY Bilateral 06  ? pterygium   ? HEMORRHOID SURGERY N/A 10/01/2019  ? Procedure: HEMORRHOIDECTOMY EXTENSIVE;  Surgeon: Aviva Signs, MD;  [facility];  Service:  General;  Laterality: N/A;  ? POLYPECTOMY  09/03/2019  ? Procedure: POLYPECTOMY;  Surgeon: Danie Binder, MD;  [facility];  Service: Endoscopy;;  hepatic flexure  ? PTERYGIUM EXCISION Bilateral   ? PTERYGIUM EXCISION Right 11/12/2013  ? Procedure: PTERYGIUM EXCISION WITH MITOMYCIN C AND AMNIOTIC MEMBRANE USING TISSEEL GLUE RIGHT EYE;  Surgeon: Marylynn Pearson, MD;  Location: [facility];  Service: Ophthalmology;  Laterality: Right;  ? ? ?Current Outpatient Medications   ?Medication Sig Dispense Refill  ? lisinopril-hydrochlorothiazide (ZESTORETIC) 20-25 MG tablet TAKE 1 TABLET BY MOUTH DAILY. 90 tablet 1  ? metFORMIN (GLUCOPHAGE) 500 MG tablet TAKE 1 TABLET (500 MG TOTAL) BY MOUTH DAILY WITH BREAKFAST. 90 tablet 3  ? atorvastatin (LIPITOR) 20 MG tablet TAKE 1 TABLET (20 MG TOTAL) BY MOUTH DAILY. 90 tablet 3  ? blood glucose meter kit and supplies Dispense based on patient and insurance preference. Use up to four times daily as directed. (FOR ICD-10 E10.9, E11.9). (Patient not taking: Reported on 12/26/2021) 1 each 0  ? Blood Glucose Monitoring Suppl (TRUE METRIX METER) w/Device KIT Use to check blood sugar once daily. (Patient not taking: Reported on 12/26/2021) 1 kit 0  ? pantoprazole (PROTONIX) 40 MG tablet Take 1 tablet (40 mg total) by mouth 2 (two) times daily before a meal. 180 tablet 1  ? ?No current facility-administered medications for this visit.  ? ? ?Allergies as of 12/26/2021 - Review Complete 12/26/2021  ?Allergen Reaction Noted  ? Squid oil Rash 08/28/2019  ? ? ?Family History  ?Problem Relation Age of Onset  ? Alzheimer's disease Mother   ? Emphysema Father   ? Colon cancer Neg Hx   ? ? ?Social History  ? ?Socioeconomic History  ? Marital status: Divorced  ?  Spouse name: Not on file  ? Number of children: Not on file  ? Years of education: Not on file  ? Highest education level: Not on file  ?Occupational History  ? Not on file  ?Tobacco Use  ? Smoking status: Former  ?  Packs/day: 1.00  ?  Years: 13.00  ?  Pack years: 13.00  ?  Types: Cigarettes  ?  Quit date: 12/10/1998  ?  Years since quitting: 23.0  ? Smokeless tobacco: Never  ?Vaping Use  ? Vaping Use: Never used  ?Substance and Sexual Activity  ? Alcohol use: No  ? Drug use: No  ? Sexual activity: Not on file  ?Other Topics Concern  ? Not on file  ?Social History Narrative  ? Needs interpreter for Spanish  ? ?Social Determinants of Health  ? ?Financial Resource Strain: Not on file  ?Food Insecurity: Not on file   ?Transportation Needs: Not on file  ?Physical Activity: Not on file  ?Stress: Not on file  ?Social Connections: Not on file  ? ? ?Review of Systems: ?Gen: Denies fever, chills, cold or flulike symptoms, presyncope, syncope.Marland Kitchen  ?CV: Denies chest pain, palpitations. ?Resp: Denies dyspnea or cough. ?GI: See HPI ?Heme: See HPI ? ?Physical Exam: ?BP 108/67   Pulse 70   Temp 97.7 ?F (36.5 ?C) (Temporal)   Ht 5' 8"  (1.727 m)   Wt 217 lb 6.4 oz (98.6 kg)   BMI 33.06 kg/m?  ?General:   Alert and oriented. No distress noted. Pleasant and cooperative.  ?Head:  Normocephalic and atraumatic. ?Eyes:  Conjuctiva clear without scleral icterus. ?Heart:  S1, S2 present without murmurs appreciated. ?Lungs:  Clear to auscultation bilaterally. No wheezes, rales, or rhonchi. No  distress.  ?Abdomen:  +BS, soft, non-tender and non-distended. No rebound or guarding. No HSM or masses noted. ?Msk:  Symmetrical without gross deformities. Normal posture. ?Extremities:  Without edema. ?Neurologic:  Alert and  oriented x4 ?Psych:  Normal mood and affect. ? ? ? ?Assessment:  ?51 year old male with history of HTN, HLD, T2DM, IDA in the past (resolved), rectal bleeding secondary to hemorrhoids s/p internal and external hemorrhoidectomy in February 2021, constipation, GERD, presenting today for 1 year follow-up of GERD with chief complaint of dysphagia. ? ?GERD: ?Not adequately controlled on Protonix 40 mg daily.  Breakthrough symptoms several nights a week often triggered by eating late in the evenings though he has difficulty with eating earlier as he works late.  We will increase his Protonix to 40 mg twice daily for now.  Reinforced GERD diet/lifestyle with the importance of not eating within 3 hours of going to bed if he is able and propping the head of his bed up. ? ?Dysphagia: ?Recurrent solid and soft food dysphagia x 4 months in the setting of uncontrolled GERD.  History of low-grade narrowing Schatzki's ring as well as esophagitis in  January 2021.  Suspect recurrent Schatzki's ring in the setting of uncontrolled GERD discussed above.  Less likely malignancy.  We will arrange EGD in the near future with possible dilation for further evaluation and t

## 2021-12-26 ENCOUNTER — Encounter: Payer: Self-pay | Admitting: Gastroenterology

## 2021-12-26 ENCOUNTER — Ambulatory Visit (INDEPENDENT_AMBULATORY_CARE_PROVIDER_SITE_OTHER): Payer: Self-pay | Admitting: Gastroenterology

## 2021-12-26 ENCOUNTER — Other Ambulatory Visit: Payer: Self-pay

## 2021-12-26 VITALS — BP 108/67 | HR 70 | Temp 97.7°F | Ht 68.0 in | Wt 217.4 lb

## 2021-12-26 DIAGNOSIS — K219 Gastro-esophageal reflux disease without esophagitis: Secondary | ICD-10-CM

## 2021-12-26 DIAGNOSIS — R1310 Dysphagia, unspecified: Secondary | ICD-10-CM | POA: Insufficient documentation

## 2021-12-26 MED ORDER — PANTOPRAZOLE SODIUM 40 MG PO TBEC
40.0000 mg | DELAYED_RELEASE_TABLET | Freq: Two times a day (BID) | ORAL | 1 refills | Status: DC
  Filled 2021-12-26: qty 60, 30d supply, fill #0

## 2021-12-26 NOTE — Patient Instructions (Addendum)
Spanish instructions: ?Haremos los arreglos para que usted tenga una endoscopia digestiva alta con posible estiramiento de su es?fago en un futuro cercano con el Dr. Abbey Chatters. ?La ma?ana de su procedimiento, no tome metformina. ? ?Aumente Protonix a 40 mg dos veces al d?a 30 minutos antes del desayuno y Secondary school teacher. ? ?Siga una dieta para la ERGE: ?Evite los alimentos fritos, grasos, grasosos, picantes y c?tricos. ?Evite la cafe?na y las bebidas carbonatadas. ?Evita el chocolate. ?Intente comer de 4 a 6 comidas peque?as al d?a en lugar de 3 comidas grandes. ?No coma dentro de las 3 horas de [place]. ?Apoye la cabecera de la cama sobre [place] o ladrillos para crear una inclinaci?n de 6 pulgadas. ? ?Precauciones para tragar: ?Coma despacio, tome bocados peque?os, mastique bien, beba muchos l?quidos durante las comidas. ?Evite las texturas a trav?s ?Todas las carnes deben picarse finamente. ?Si algo se atasca en su es?fago y no sube ni baja, dir?jase a la sala de emergencias. ? ? ?Nos vemos en la oficina despu?s de su procedimiento. No dude en llamar si tiene alguna pregunta o inquietud antes. ? ??Fue genial verte de nuevo hoy! ? ?English instructions: ?We will arrange for you to have an upper endoscopy with possible stretching of your esophagus in the near future with Dr. Abbey Chatters. ?Morning of your procedure, do not take metformin. ? ?Increase Protonix to 40 mg twice daily 30 minutes before breakfast and dinner. ? ?Follow a GERD diet:  ?Avoid fried, fatty, greasy, spicy, citrus foods. ?Avoid caffeine and carbonated beverages. ?Avoid chocolate. ?Try eating 4-6 small meals a day rather than 3 large meals. ?Do not eat within 3 hours of laying down. ?Prop head of bed up on wood or bricks to create a 6 inch incline. ? ?Swallowing precautions:  ?Eat slowly, take small bites, chew thoroughly, drink plenty of liquids throughout meals.  ?Avoid trough textures ?All meats should be chopped finely.  ?If something gets hung in  your esophagus and will not come up or go down, proceed to the emergency room.   ? ? ?We will see you back in the office after your procedure.  Do not hesitate to call if you have any questions or concerns prior. ? ?It was great to see you again today! ? ?Aliene Altes, PA-C ?[facility] ? ? ?

## 2021-12-27 ENCOUNTER — Other Ambulatory Visit: Payer: Self-pay

## 2021-12-27 ENCOUNTER — Telehealth: Payer: Self-pay

## 2021-12-27 DIAGNOSIS — R1310 Dysphagia, unspecified: Secondary | ICD-10-CM

## 2021-12-27 DIAGNOSIS — K219 Gastro-esophageal reflux disease without esophagitis: Secondary | ICD-10-CM

## 2021-12-27 NOTE — Telephone Encounter (Signed)
Called pt via interpreter line (interpreter ID: [phone]), EGD/-/+DIL ASA 2 w/Dr. Abbey Chatters scheduled for 01/27/22 at 11:00am. ? ?Called pt back via interpreter line (interpreter ID: [phone]) to inform him he needs lab work done 01/25/22. Procedure instructions and lab letter mailed in [place] to pt. Orders entered. ?

## 2022-01-25 ENCOUNTER — Other Ambulatory Visit (HOSPITAL_COMMUNITY)
Admission: RE | Admit: 2022-01-25 | Discharge: 2022-01-25 | Disposition: A | Discharge status: Home / Self Care | Payer: Self-pay | Source: Ambulatory Visit | Attending: Internal Medicine | Admitting: Internal Medicine

## 2022-01-25 DIAGNOSIS — R1310 Dysphagia, unspecified: Secondary | ICD-10-CM | POA: Insufficient documentation

## 2022-01-25 DIAGNOSIS — K219 Gastro-esophageal reflux disease without esophagitis: Secondary | ICD-10-CM | POA: Insufficient documentation

## 2022-01-25 LAB — BASIC METABOLIC PANEL
Anion gap: 5 (ref 5–15)
BUN: 12 mg/dL (ref 6–20)
CO2: 25 mmol/L (ref 22–32)
Calcium: 9.1 mg/dL (ref 8.9–10.3)
Chloride: 108 mmol/L (ref 98–111)
Creatinine, Ser: 0.77 mg/dL (ref 0.61–1.24)
GFR, Estimated: >60 mL/min (ref >60)
Glucose, Bld: 126 mg/dL — ABNORMAL HIGH (ref 70–99)
Potassium: 3.5 mmol/L (ref 3.5–5.1)
Sodium: 138 mmol/L (ref 135–145)

## 2022-01-27 ENCOUNTER — Other Ambulatory Visit: Payer: Self-pay

## 2022-01-27 ENCOUNTER — Encounter (HOSPITAL_COMMUNITY)
Admission: RE | Disposition: A | Discharge status: Home / Self Care | Payer: Self-pay | Source: Home / Self Care | Attending: Internal Medicine

## 2022-01-27 ENCOUNTER — Ambulatory Visit (HOSPITAL_COMMUNITY): Payer: Self-pay | Admitting: Certified Registered Nurse Anesthetist

## 2022-01-27 ENCOUNTER — Ambulatory Visit (HOSPITAL_BASED_OUTPATIENT_CLINIC_OR_DEPARTMENT_OTHER): Payer: Self-pay | Admitting: Certified Registered Nurse Anesthetist

## 2022-01-27 ENCOUNTER — Encounter (HOSPITAL_COMMUNITY): Payer: Self-pay

## 2022-01-27 ENCOUNTER — Ambulatory Visit (HOSPITAL_COMMUNITY)
Admission: RE | Admit: 2022-01-27 | Discharge: 2022-01-27 | Disposition: A | Discharge status: Home / Self Care | Payer: Self-pay | Attending: Internal Medicine | Admitting: Internal Medicine

## 2022-01-27 DIAGNOSIS — K449 Diaphragmatic hernia without obstruction or gangrene: Secondary | ICD-10-CM | POA: Insufficient documentation

## 2022-01-27 DIAGNOSIS — R1310 Dysphagia, unspecified: Secondary | ICD-10-CM

## 2022-01-27 DIAGNOSIS — K222 Esophageal obstruction: Secondary | ICD-10-CM

## 2022-01-27 DIAGNOSIS — I10 Essential (primary) hypertension: Secondary | ICD-10-CM | POA: Insufficient documentation

## 2022-01-27 DIAGNOSIS — E119 Type 2 diabetes mellitus without complications: Secondary | ICD-10-CM | POA: Insufficient documentation

## 2022-01-27 DIAGNOSIS — K2970 Gastritis, unspecified, without bleeding: Secondary | ICD-10-CM

## 2022-01-27 DIAGNOSIS — K2100 Gastro-esophageal reflux disease with esophagitis, without bleeding: Secondary | ICD-10-CM

## 2022-01-27 DIAGNOSIS — K2950 Unspecified chronic gastritis without bleeding: Secondary | ICD-10-CM | POA: Insufficient documentation

## 2022-01-27 DIAGNOSIS — Z87891 Personal history of nicotine dependence: Secondary | ICD-10-CM

## 2022-01-27 DIAGNOSIS — Z6837 Body mass index (BMI) 37.0-37.9, adult: Secondary | ICD-10-CM | POA: Insufficient documentation

## 2022-01-27 HISTORY — PX: ESOPHAGOGASTRODUODENOSCOPY (EGD) WITH PROPOFOL: SHX5813

## 2022-01-27 HISTORY — PX: BALLOON DILATION: SHX5330

## 2022-01-27 HISTORY — PX: BIOPSY: SHX5522

## 2022-01-27 LAB — GLUCOSE, CAPILLARY: Glucose-Capillary: 111 mg/dL — ABNORMAL HIGH (ref 70–99)

## 2022-01-27 SURGERY — ESOPHAGOGASTRODUODENOSCOPY (EGD) WITH PROPOFOL
Anesthesia: General

## 2022-01-27 MED ORDER — LACTATED RINGERS IV SOLN: INTRAVENOUS | Status: DC

## 2022-01-27 MED ORDER — PROPOFOL 10 MG/ML IV BOLUS
INTRAVENOUS | Status: DC | PRN
  Administered 2022-01-27: 100 mg via INTRAVENOUS
  Administered 2022-01-27: 50 mg via INTRAVENOUS

## 2022-01-27 MED ORDER — LIDOCAINE HCL (CARDIAC) PF 100 MG/5ML IV SOSY
PREFILLED_SYRINGE | INTRAVENOUS | Status: DC | PRN
  Administered 2022-01-27: 50 mg via INTRATRACHEAL

## 2022-01-27 NOTE — Discharge Instructions (Addendum)
EGD Discharge instructions Please read the instructions outlined below and refer to this sheet in the next few weeks. These discharge instructions provide you with general information on caring for yourself after you leave the hospital. Your doctor may also give you specific instructions. While your treatment has been planned according to the most current medical practices available, unavoidable complications occasionally occur. If you have any problems or questions after discharge, please call your doctor. ACTIVITY You may resume your regular activity but move at a slower pace for the next 24 hours.  Take frequent rest periods for the next 24 hours.  Walking will help expel (get rid of) the air and reduce the bloated feeling in your abdomen.  No driving for 24 hours (because of the anesthesia (medicine) used during the test).  You may shower.  Do not sign any important legal documents or operate any machinery for 24 hours (because of the anesthesia used during the test).  NUTRITION Drink plenty of fluids.  You may resume your normal diet.  Begin with a light meal and progress to your normal diet.  Avoid alcoholic beverages for 24 hours or as instructed by your caregiver.  MEDICATIONS You may resume your normal medications unless your caregiver tells you otherwise.  WHAT YOU CAN EXPECT TODAY You may experience abdominal discomfort such as a feeling of fullness or "gas" pains.  FOLLOW-UP Your doctor will discuss the results of your test with you.  SEEK IMMEDIATE MEDICAL ATTENTION IF ANY OF THE FOLLOWING OCCUR: Excessive nausea (feeling sick to your stomach) and/or vomiting.  Severe abdominal pain and distention (swelling).  Trouble swallowing.  Temperature over 101 F (37.8 C).  Rectal bleeding or vomiting of blood.   Your EGD revealed mild amount inflammation in your stomach.  I took biopsies of this to rule out infection with a bacteria called H. pylori.  Await pathology results, my  office will contact you. You also had a tightening of your esophagus which I stretched with a balloon today. Hopefully this helps with your swallowing. Continue on pantoprazole twice daily. Follow up with Kristen in 3-4 months; LEFT MESSAGE WITH THE OFFICE TO CALL AND SCHEDULE THE APPOINTMENT WITH YOU.     I hope you have a great rest of your week!  Elon Alas. Abbey Chatters, D.O. Gastroenterology and Hepatology [facility]

## 2022-01-27 NOTE — Op Note (Signed)
[facility] Patient Name: Ricardo Ward Procedure Date: 01/27/2022 10:17 AM MRN: [id number] Date of Birth: [birth year] Attending MD: Elon Alas. Abbey Chatters DO CSN: [id number] Age: 51 Admit Type: Outpatient Procedure:                Upper GI endoscopy Indications:              Dysphagia, Heartburn Providers:                Elon Alas. Abbey Chatters, DO, Caprice Kluver, Raphael Gibney,                            Technician Referring MD:             Elon Alas. Abbey Chatters, DO Medicines:                See the Anesthesia note for documentation of the                            administered medications Complications:            No immediate complications. Estimated Blood Loss:     Estimated blood loss was minimal. Procedure:                Pre-Anesthesia Assessment:                           - The anesthesia plan was to use monitored                            anesthesia care (MAC).                           After obtaining informed consent, the endoscope was                            passed under direct vision. Throughout the                            procedure, the patient's blood pressure, pulse, and                            oxygen saturations were monitored continuously. The                            GIF-H190 ([id number]) scope was introduced through the                            mouth, and advanced to the second part of duodenum.                            The upper GI endoscopy was accomplished without                            difficulty. The patient tolerated the procedure                            well. Scope In: 10:25:26  AM Scope Out: 10:31:06 AM Total Procedure Duration: 0 hours 5 minutes 40 seconds  Findings:      A small hiatal hernia was present.      LA Grade A (one or more mucosal breaks less than 5 mm, not extending       between tops of 2 mucosal folds) esophagitis with no bleeding was found       at the gastroesophageal junction.      A mild Schatzki ring was found in the distal  esophagus. A TTS dilator       was passed through the scope. Dilation with an 18-19-20 mm balloon       dilator was performed to 20 mm. The dilation site was examined and       showed moderate improvement in luminal narrowing. Ring was then further       disrupted with cold forceps.      Patchy mild inflammation characterized by erythema was found in the       gastric body. Biopsies were taken with a cold forceps for Helicobacter       pylori testing.      The duodenal bulb, first portion of the duodenum and second portion of       the duodenum were normal. Impression:               - Small hiatal hernia.                           - LA Grade A reflux esophagitis with no bleeding.                           - Mild Schatzki ring. Dilated.                           - Gastritis. Biopsied.                           - Normal duodenal bulb, first portion of the                            duodenum and second portion of the duodenum. Moderate Sedation:      Per Anesthesia Care Recommendation:           - Patient has a contact number available for                            emergencies. The signs and symptoms of potential                            delayed complications were discussed with the                            patient. Return to normal activities tomorrow.                            Written discharge instructions were provided to the                            patient.                           -  Resume previous diet.                           - Continue present medications.                           - Await pathology results.                           - Repeat upper endoscopy PRN for retreatment.                           - Return to GI clinic in 4 months.                           - Use Protonix (pantoprazole) 40 mg PO BID. Procedure Code(s):        --- Professional ---                           [phone], Esophagogastroduodenoscopy, flexible,                            transoral; with  transendoscopic balloon dilation of                            esophagus (less than 30 mm diameter)                           43239, 59, Esophagogastroduodenoscopy, flexible,                            transoral; with biopsy, single or multiple Diagnosis Code(s):        --- Professional ---                           K44.9, Diaphragmatic hernia without obstruction or                            gangrene                           K21.00, Gastro-esophageal reflux disease with                            esophagitis, without bleeding                           K22.2, Esophageal obstruction                           K29.70, Gastritis, unspecified, without bleeding                           R13.10, Dysphagia, unspecified                           R12, Heartburn CPT copyright 2019 American Medical Association. All rights reserved. The codes documented in this report  are preliminary and upon coder review may  be revised to meet current compliance requirements. Elon Alas. Abbey Chatters, DO [place] Abbey Chatters, DO 01/27/2022 10:34:59 AM This report has been signed electronically. Number of Addenda: 0

## 2022-01-27 NOTE — H&P (Signed)
Primary Care Physician:  Ladell Pier, MD Primary Gastroenterologist:  Dr. Abbey Chatters  Pre-Procedure History & Physical: HPI:  Ricardo Ward is a 51 y.o. male is here for an EGD with possible dilation to be performed for GERD and dysphagia  Past Medical History:  Diagnosis Date   COVID-19 12/2018   s/p hospitalization and recovery   Diabetes ([place])    GERD (gastroesophageal reflux disease)    HLD (hyperlipidemia)    HTN (hypertension)    Pterygium    rt eye   Pterygium of both eyes    removal    Past Surgical History:  Procedure Laterality Date   APPENDECTOMY     BACK SURGERY     BIOPSY  09/03/2019   Procedure: BIOPSY;  Surgeon: Danie Binder, MD;  Location: [facility];  Service: Endoscopy;;  gastric   COLONOSCOPY N/A 09/03/2019    Surgeon: Danie Binder, MD;  5 mm sessile serrated polyp, diverticulosis, rectal bleeding due to internal hemorrhoids.  Due for repeat colonoscopy in 2026.   ESOPHAGOGASTRODUODENOSCOPY N/A 09/03/2019   Surgeon: Danie Binder, MD;  LA grade B esophagitis without bleeding, low-grade narrowing Schatzki's ring, localized mild inflammation in the stomach biopsied (chronic gastritis, negative for H. pylori).    EYE SURGERY Bilateral 06   pterygium    HEMORRHOID SURGERY N/A 10/01/2019   Procedure: HEMORRHOIDECTOMY EXTENSIVE;  Surgeon: Aviva Signs, MD;  Location: [facility];  Service: General;  Laterality: N/A;   POLYPECTOMY  09/03/2019   Procedure: POLYPECTOMY;  Surgeon: Danie Binder, MD;  Location: [facility];  Service: Endoscopy;;  hepatic flexure   PTERYGIUM EXCISION Bilateral    PTERYGIUM EXCISION Right 11/12/2013   Procedure: PTERYGIUM EXCISION WITH MITOMYCIN C AND AMNIOTIC MEMBRANE USING TISSEEL GLUE RIGHT EYE;  Surgeon: Marylynn Pearson, MD;  Location: [place];  Service: Ophthalmology;  Laterality: Right;    Prior to Admission medications   Medication Sig Start Date End Date Taking? Authorizing Provider  atorvastatin (LIPITOR) 20 MG tablet  TAKE 1 TABLET (20 MG TOTAL) BY MOUTH DAILY. 10/13/20 01/23/22 Yes Swords, Darrick Penna, MD  Blood Glucose Monitoring Suppl (TRUE METRIX METER) w/Device KIT Use to check blood sugar once daily. 09/22/20  Yes Ladell Pier, MD  Cyanocobalamin (B-12 PO) Take 1 tablet by mouth daily as needed (energy).   Yes [provider]  lisinopril-hydrochlorothiazide (ZESTORETIC) 20-25 MG tablet TAKE 1 TABLET BY MOUTH DAILY. 10/13/20 01/23/22 Yes Swords, Darrick Penna, MD  metFORMIN (GLUCOPHAGE) 500 MG tablet TAKE 1 TABLET (500 MG TOTAL) BY MOUTH DAILY WITH BREAKFAST. 10/13/20 01/23/22 Yes Swords, Darrick Penna, MD  pantoprazole (PROTONIX) 40 MG tablet Take 1 tablet (40 mg total) by mouth 2 (two) times daily before a meal. 12/26/21  Yes Aliene Altes S, PA-C  blood glucose meter kit and supplies Dispense based on patient and insurance preference. Use up to four times daily as directed. (FOR ICD-10 E10.9, E11.9). Patient not taking: Reported on 12/26/2021 01/29/19   Jonetta Osgood, MD    Allergies as of 12/27/2021 - Review Complete 12/26/2021  Allergen Reaction Noted   Squid oil Rash 08/28/2019    Family History  Problem Relation Age of Onset   Alzheimer's disease Mother    Emphysema Father    Colon cancer Neg Hx     Social History   Socioeconomic History   Marital status: Divorced    Spouse name: Not on file   Number of children: Not on file   Years of education: Not  on file   Highest education level: Not on file  Occupational History   Not on file  Tobacco Use   Smoking status: Former    Packs/day: 1.00    Years: 13.00    Total pack years: 13.00    Types: Cigarettes    Quit date: 12/10/1998    Years since quitting: 23.1   Smokeless tobacco: Never  Vaping Use   Vaping Use: Never used  Substance and Sexual Activity   Alcohol use: No   Drug use: No   Sexual activity: Not on file  Other Topics Concern   Not on file  Social History Narrative   Needs interpreter for Spanish   Social Determinants of  Health   Financial Resource Strain: Not on file  Food Insecurity: Not on file  Transportation Needs: Not on file  Physical Activity: Not on file  Stress: Not on file  Social Connections: Not on file  Intimate Partner Violence: Not on file    Review of Systems: General: Negative for fever, chills, fatigue, weakness. Eyes: Negative for vision changes.  ENT: Negative for hoarseness, difficulty swallowing , nasal congestion. CV: Negative for chest pain, angina, palpitations, dyspnea on exertion, peripheral edema.  Respiratory: Negative for dyspnea at rest, dyspnea on exertion, cough, sputum, wheezing.  GI: See history of present illness. GU:  Negative for dysuria, hematuria, urinary incontinence, urinary frequency, nocturnal urination.  MS: Negative for joint pain, low back pain.  Derm: Negative for rash or itching.  Neuro: Negative for weakness, abnormal sensation, seizure, frequent headaches, memory loss, confusion.  Psych: Negative for anxiety, depression Endo: Negative for unusual weight change.  Heme: Negative for bruising or bleeding. Allergy: Negative for rash or hives.  Physical Exam: Vital signs in last 24 hours: Temp:  [98.3 F (36.8 C)] 98.3 F (36.8 C) (06/09 0935) Pulse Rate:  [66] 66 (06/09 0935) Resp:  [15] 15 (06/09 0935) BP: (156)/(82) 156/82 (06/09 0937) SpO2:  [96 %] 96 % (06/09 0935) Weight:  [108.9 kg] 108.9 kg (06/09 0934)   General:   Alert,  Well-developed, well-nourished, pleasant and cooperative in NAD Head:  Normocephalic and atraumatic. Eyes:  Sclera clear, no icterus.   Conjunctiva pink. Ears:  Normal auditory acuity. Nose:  No deformity, discharge,  or lesions. Mouth:  No deformity or lesions, dentition normal. Neck:  Supple; no masses or thyromegaly. Lungs:  Clear throughout to auscultation.   No wheezes, crackles, or rhonchi. No acute distress. Heart:  Regular rate and rhythm; no murmurs, clicks, rubs,  or gallops. Abdomen:  Soft, nontender  and nondistended. No masses, hepatosplenomegaly or hernias noted. Normal bowel sounds, without guarding, and without rebound.   Msk:  Symmetrical without gross deformities. Normal posture. Extremities:  Without clubbing or edema. Neurologic:  Alert and  oriented x4;  grossly normal neurologically. Skin:  Intact without significant lesions or rashes. Cervical Nodes:  No significant cervical adenopathy. Psych:  Alert and cooperative. Normal mood and affect.   Impression/Plan: Ricardo Ward is here for an EGD with possible dilation to be performed for GERD and dysphagia  Risks, benefits, limitations, imponderables and alternatives regarding EGD have been reviewed with the patient. Questions have been answered. All parties agreeable.

## 2022-01-27 NOTE — Transfer of Care (Signed)
Immediate Anesthesia Transfer of Care Note  Patient: Ricardo Ward  Procedure(s) Performed: ESOPHAGOGASTRODUODENOSCOPY (EGD) WITH PROPOFOL BALLOON DILATION BIOPSY  Patient Location: PACU  Anesthesia Type:General  Level of Consciousness: awake, alert  and oriented  Airway & Oxygen Therapy: Patient Spontanous Breathing  Post-op Assessment: Report given to RN, Post -op Vital signs reviewed and stable and Patient moving all extremities X 4  Post vital signs: Reviewed and stable  Last Vitals:  Vitals Value Taken Time  BP    Temp    Pulse    Resp    SpO2      Last Pain:  Vitals:   01/27/22 1021  TempSrc:   PainSc: 7       Patients Stated Pain Goal: 8 (20/03/79 4446)  Complications: No notable events documented.

## 2022-01-27 NOTE — Anesthesia Preprocedure Evaluation (Signed)
Anesthesia Evaluation  Patient identified by MRN, date of birth, ID band Patient awake    Reviewed: Allergy & Precautions, H&P , NPO status , Patient's Chart, lab work & pertinent test results, reviewed documented beta blocker date and time   Airway Mallampati: II  TM Distance: >3 FB Neck ROM: full    Dental no notable dental hx.    Pulmonary neg pulmonary ROS, former smoker,    Pulmonary exam normal breath sounds clear to auscultation       Cardiovascular Exercise Tolerance: Good hypertension, negative cardio ROS   Rhythm:regular Rate:Normal     Neuro/Psych negative neurological ROS  negative psych ROS   GI/Hepatic Neg liver ROS, GERD  Medicated,  Endo/Other  diabetes, Type 2Morbid obesity  Renal/GU negative Renal ROS  negative genitourinary   Musculoskeletal   Abdominal   Peds  Hematology  (+) Blood dyscrasia, anemia ,   Anesthesia Other Findings   Reproductive/Obstetrics negative OB ROS                             Anesthesia Physical Anesthesia Plan  ASA: 3  Anesthesia Plan: General   Post-op Pain Management:    Induction:   PONV Risk Score and Plan: Propofol infusion  Airway Management Planned:   Additional Equipment:   Intra-op Plan:   Post-operative Plan:   Informed Consent: I have reviewed the patients History and Physical, chart, labs and discussed the procedure including the risks, benefits and alternatives for the proposed anesthesia with the patient or authorized representative who has indicated his/her understanding and acceptance.     Dental Advisory Given  Plan Discussed with: CRNA  Anesthesia Plan Comments:         Anesthesia Quick Evaluation

## 2022-01-27 NOTE — Anesthesia Postprocedure Evaluation (Signed)
Anesthesia Post Note  Patient: Ricardo Ward  Procedure(s) Performed: ESOPHAGOGASTRODUODENOSCOPY (EGD) WITH PROPOFOL BALLOON DILATION BIOPSY  Patient location during evaluation: Phase II Anesthesia Type: General Level of consciousness: awake Pain management: pain level controlled Vital Signs Assessment: post-procedure vital signs reviewed and stable Respiratory status: spontaneous breathing and respiratory function stable Cardiovascular status: blood pressure returned to baseline and stable Postop Assessment: no headache and no apparent nausea or vomiting Anesthetic complications: no Comments: Late entry   No notable events documented.   Last Vitals:  Vitals:   01/27/22 0937 01/27/22 1034  BP: (!) 156/82 114/71  Pulse:  68  Resp:  18  Temp:  (!) 36.3 C  SpO2:  94%    Last Pain:  Vitals:   01/27/22 1034  TempSrc: Oral  PainSc: 0-No pain                 Louann Sjogren

## 2022-01-30 LAB — SURGICAL PATHOLOGY

## 2022-02-03 ENCOUNTER — Encounter (HOSPITAL_COMMUNITY): Payer: Self-pay | Admitting: Internal Medicine

## 2022-04-05 ENCOUNTER — Encounter: Payer: Self-pay | Admitting: *Deleted

## 2022-05-22 ENCOUNTER — Encounter: Payer: Self-pay | Admitting: Family Medicine

## 2022-05-22 ENCOUNTER — Other Ambulatory Visit (HOSPITAL_COMMUNITY): Payer: Self-pay

## 2022-05-22 ENCOUNTER — Ambulatory Visit: Payer: Self-pay | Admitting: Family Medicine

## 2022-05-22 ENCOUNTER — Other Ambulatory Visit: Payer: Self-pay | Admitting: Internal Medicine

## 2022-05-22 VITALS — BP 146/88 | HR 70 | Temp 98.2°F | Wt 221.7 lb

## 2022-05-22 DIAGNOSIS — Z87891 Personal history of nicotine dependence: Secondary | ICD-10-CM

## 2022-05-22 DIAGNOSIS — E1159 Type 2 diabetes mellitus with other circulatory complications: Secondary | ICD-10-CM

## 2022-05-22 DIAGNOSIS — K219 Gastro-esophageal reflux disease without esophagitis: Secondary | ICD-10-CM

## 2022-05-22 DIAGNOSIS — E669 Obesity, unspecified: Secondary | ICD-10-CM

## 2022-05-22 DIAGNOSIS — E119 Type 2 diabetes mellitus without complications: Secondary | ICD-10-CM

## 2022-05-22 DIAGNOSIS — Z23 Encounter for immunization: Secondary | ICD-10-CM

## 2022-05-22 DIAGNOSIS — E1169 Type 2 diabetes mellitus with other specified complication: Secondary | ICD-10-CM

## 2022-05-22 DIAGNOSIS — Z7984 Long term (current) use of oral hypoglycemic drugs: Secondary | ICD-10-CM

## 2022-05-22 DIAGNOSIS — E785 Hyperlipidemia, unspecified: Secondary | ICD-10-CM

## 2022-05-22 DIAGNOSIS — Z6834 Body mass index (BMI) 34.0-34.9, adult: Secondary | ICD-10-CM

## 2022-05-22 DIAGNOSIS — I152 Hypertension secondary to endocrine disorders: Secondary | ICD-10-CM

## 2022-05-22 LAB — POCT GLYCOSYLATED HEMOGLOBIN (HGB A1C): Hemoglobin A1C: 6.4 % — AB (ref 4.0–5.6)

## 2022-05-22 LAB — GLUCOSE, CAPILLARY: Glucose-Capillary: 103 mg/dL — ABNORMAL HIGH (ref 70–99)

## 2022-05-22 MED ORDER — LISINOPRIL-HYDROCHLOROTHIAZIDE 20-25 MG PO TABS
1.0000 | ORAL_TABLET | Freq: Every day | ORAL | 2 refills | Status: DC
  Filled 2022-05-22: qty 30, 30d supply, fill #0
  Filled 2022-08-16: qty 30, 30d supply, fill #1
  Filled 2022-10-03: qty 30, 30d supply, fill #2

## 2022-05-22 MED ORDER — METFORMIN HCL 500 MG PO TABS
500.0000 mg | ORAL_TABLET | Freq: Every day | ORAL | 2 refills | Status: DC
  Filled 2022-05-22: qty 30, 30d supply, fill #0
  Filled 2022-08-16: qty 30, 30d supply, fill #1
  Filled 2022-10-03: qty 30, 30d supply, fill #2

## 2022-05-22 MED ORDER — ATORVASTATIN CALCIUM 20 MG PO TABS
20.0000 mg | ORAL_TABLET | Freq: Every day | ORAL | 3 refills | Status: DC
  Filled 2022-05-22: qty 30, 30d supply, fill #0
  Filled 2022-08-16: qty 30, 30d supply, fill #1
  Filled 2022-10-03: qty 30, 30d supply, fill #2
  Filled 2023-01-04: qty 30, 30d supply, fill #3

## 2022-05-22 NOTE — Assessment & Plan Note (Signed)
Patient had EGD in June 2023 for persistent GERD and dysphagia.  The EGD was negative for H. pylori, intestinal metaplasia, dysplasia or carcinoma.  Patient has made dietary changes as recommended by the gastroenterologist.  Patient reports his symptoms have completely resolved and he is not taking PPI anymore.  Patient is scheduled to follow-up with gastroenterology in 1 year.

## 2022-05-22 NOTE — Patient Instructions (Signed)
All your prescription medications were sent to the pharmacy. Take all your prescribed medications regularly. Your labs were ordered.  Please return to the clinic for blood test as soon as you or [facility] gets approved. Monitor your blood sugar and blood pressure at home.  Keep the log for blood pressure for 2 weeks before coming to the clinic. You received flu vaccine today

## 2022-05-22 NOTE — Progress Notes (Signed)
   CC: Establish primary care services.  HPI: [name] is a 51 y.o. Spanish-speaking male with past medical history listed below presenting to establish primary care services.. For details of today's visit and the status of his chronic medical issues please refer to the assessment and plan.  Patient's blood pressure elevated upon arrival with a reading of 145/93.  Patient reports he ran out of his prescription medications for a month.  Patient is applying for orange card.  Interpreter Adriana ID number [phone] helped with translation.   Past Medical History:  Diagnosis Date   COVID-19 12/2018   s/p hospitalization and recovery   Diabetes ([place])    GERD (gastroesophageal reflux disease)    HLD (hyperlipidemia)    HTN (hypertension)    Pterygium    rt eye   Pterygium of both eyes    removal   Review of Systems: Review of Systems  Constitutional: Negative.   Respiratory:  Negative for shortness of breath.   Cardiovascular:  Negative for chest pain.  Neurological:  Negative for dizziness and headaches.     Physical Exam: Physical Exam Vitals and nursing note reviewed.  Constitutional:      Appearance: Normal appearance.  HENT:     Head: Normocephalic.  Eyes:     Conjunctiva/sclera: Conjunctivae normal.  Cardiovascular:     Rate and Rhythm: Normal rate and regular rhythm.     Pulses: Normal pulses.     Heart sounds: Normal heart sounds. No murmur heard.    No gallop.  Pulmonary:     Effort: Pulmonary effort is normal.     Breath sounds: Normal breath sounds. No wheezing or rales.  Abdominal:     General: Bowel sounds are normal. There is no distension.     Tenderness: There is no abdominal tenderness. There is no guarding.  Musculoskeletal:        General: Normal range of motion.     Cervical back: Neck supple.  Skin:    General: Skin is warm.     Capillary Refill: Capillary refill takes less than 2 seconds.  Neurological:     General: No focal deficit  present.     Mental Status: He is alert and oriented to person, place, and time.  Psychiatric:        Mood and Affect: Mood normal.        Behavior: Behavior normal.      Vitals:   05/22/22 1426 05/22/22 1517  BP: (!) 145/93 (!) 146/88  Pulse: 77 70  Temp: 98.2 F (36.8 C)   TempSrc: Oral   SpO2: 97%   Weight: 221 lb 11.2 oz (100.6 kg)      Assessment & Plan:   See Encounters Tab for problem based charting.  Patient seen with Dr. Jimmye Norman

## 2022-05-22 NOTE — Assessment & Plan Note (Signed)
Patient ran out of his prescription metformin.  Patient is not taking the medication for about a month.  Patient's CBG was 103 in the clinic and his hemoglobin A1c was 6.4.  His hemoglobin A1c in February 2022 was 6.3. I will continue the patient on the same dosage of metformin. No lab work was done today as patient is applying for orange card.  Lab order for future was placed in the chart and patient was made aware to return to the clinic for blood draw as soon as his orange card gets approved.  Patient agrees with the plan

## 2022-05-22 NOTE — Assessment & Plan Note (Signed)
Patient ran out of his blood pressure medication of lisinopril-hydrochlorothiazide.  Patient is not taking his medication for about a month.  His blood pressure was high with a reading of 145/93 in the clinic.  His repeat blood pressure was 146/88.  Patient denies headache, dizziness, chest pain or visual changes.  I am attributing the increase in blood pressure secondary to running out of the medication. New prescription was sent to the pharmacy.  Patient educated to start the medication today. Patient was also educated to keep the blood pressure log for 2 weeks before his next follow-up appointment. Patient was also informed the order for the blood test was placed in the chart.  He will return to the clinic for blood draw as soon as his orange card gets approved.  Patient agrees with the plan of care.

## 2022-05-22 NOTE — Assessment & Plan Note (Deleted)
Patient ran out of his prescription metformin.  Patient is not taking the medication for about a month.  Patient's CBG was 103 in the clinic and his hemoglobin A1c was 6.4.  His hemoglobin A1c in February 2022 was 6.3. I will continue the patient on the same dosage of metformin. No lab work was done today as patient is applying for orange card.  Lab order for future was placed in the chart and patient was made aware to return to the clinic for blood draw as soon as his orange card gets approved.  Patient agrees with the plan

## 2022-05-29 NOTE — Progress Notes (Signed)
Internal Medicine Clinic Attending  I saw and evaluated the patient.  I personally confirmed the key portions of the history and exam documented by Dr. Multani and I reviewed pertinent patient test results.  The assessment, diagnosis, and plan were formulated together and I agree with the documentation in the resident's note.  

## 2022-08-16 ENCOUNTER — Other Ambulatory Visit (HOSPITAL_COMMUNITY): Payer: Self-pay

## 2022-10-03 ENCOUNTER — Other Ambulatory Visit (HOSPITAL_COMMUNITY): Payer: Self-pay

## 2023-01-02 NOTE — H&P (View-Only) (Signed)
Referring Provider: Rana Snare, DO Primary Care Physician:  Rana Snare, DO Primary GI Physician: Dr. Marletta Lor  Chief Complaint  Patient presents with   Gastroesophageal Reflux    Acid reflux and pain in stomach     HPI:   Ricardo Ward is a 52 y.o. male presenting today with chief complaint of GERD and dysphagia.  History of IDA in the past, rectal bleeding secondary to hemorrhoids, constipation, GERD, and dysphagia in the setting of Schatzki's ring. Colonoscopy and EGD in January 2021. Colonoscopy with 5 mm sessile serrated polyp, diverticulosis, rectal bleeding due to internal hemorrhoids. Due for repeat colonoscopy in 2026. EGD with grade B esophagitis without bleeding, low-grade narrowing Schatzki's ring, localized mild inflammation in the stomach biopsied (chronic gastritis, negative for H. pylori). Overall, though normocytic anemia was due to erosive esophagitis, gastritis, and hemorrhoidal bleeding. He underwent internal and external hemorrhoidectomy in February 2021.  Anemia resolved.   Last seen in our office 12/26/2021.  Reported dysphagia for 4 months.  Nocturnal reflux if eating late which was about 3-4 times a week due to his work schedule.  He was taking Protonix once daily.  Occasional epigastric discomfort if drinking soda.  Denied NSAIDs.  Protonix was increased to twice daily and he was scheduled for an EGD.  EGD 01/27/2022: Small hiatal hernia, grade a reflux esophagitis, mild Schatzki's ring dilated, gastritis biopsied.  Pathology with reactive gastropathy with mild chronic gastritis, negative for H. pylori.  Today: Was feeling well while on pantoprazole, but does not take pantoprazole consistently.  He will stop pantoprazole when he is feeling well, then start back when his reflux symptoms flareup.  He started having more severe reflux symptoms and also started having swallowing problems again, and resume pantoprazole daily about 15 days ago.  He is starting to feel  better, but is still having reflux symptoms 3+ days a week.  He is no longer eating late.  He avoids spicy foods.  He feels that meats and tortillas get stuck in his esophagus.  They will eventually go down, but he has associated pain when the food is stuck.  Would like to have another upper endoscopy.  Prior dilation resolved dysphagia.  No NSAIDs.   No abdominal pain, brbpr, or melena.   Past Medical History:  Diagnosis Date   COVID-19 12/2018   s/p hospitalization and recovery   Diabetes (HCC)    GERD (gastroesophageal reflux disease)    HLD (hyperlipidemia)    HTN (hypertension)    Pterygium    rt eye   Pterygium of both eyes    removal    Past Surgical History:  Procedure Laterality Date   APPENDECTOMY     BACK SURGERY     BALLOON DILATION N/A 01/27/2022   Procedure: BALLOON DILATION;  Surgeon: Lanelle Bal, DO;  [facility];  Service: Endoscopy;  Laterality: N/A;   BIOPSY  09/03/2019   Procedure: BIOPSY;  Surgeon: West Bali, MD;  [facility];  Service: Endoscopy;;  gastric   BIOPSY  01/27/2022   Procedure: BIOPSY;  Surgeon: Lanelle Bal, DO;  [facility];  Service: Endoscopy;;   COLONOSCOPY N/A 09/03/2019    Surgeon: West Bali, MD;  5 mm sessile serrated polyp, diverticulosis, rectal bleeding due to internal hemorrhoids.  Due for repeat colonoscopy in 2026.   ESOPHAGOGASTRODUODENOSCOPY N/A 09/03/2019   Surgeon: West Bali, MD;  LA grade B esophagitis without bleeding, low-grade narrowing Schatzki's ring, localized  mild inflammation in the stomach biopsied (chronic gastritis, negative for H. pylori).    ESOPHAGOGASTRODUODENOSCOPY (EGD) WITH PROPOFOL N/A 01/27/2022   Surgeon: Lanelle Bal, DO; Small hiatal hernia, grade a reflux esophagitis, mild Schatzki's ring dilated, gastritis biopsied.  Pathology with reactive gastropathy with mild chronic gastritis, negative for H. pylori.   EYE SURGERY Bilateral 2006    pterygium    HEMORRHOID SURGERY N/A 10/01/2019   Procedure: HEMORRHOIDECTOMY EXTENSIVE;  Surgeon: Franky Macho, MD;  Location: [facility];  Service: General;  Laterality: N/A;   POLYPECTOMY  09/03/2019   Procedure: POLYPECTOMY;  Surgeon: West Bali, MD;  [facility];  Service: Endoscopy;;  hepatic flexure   PTERYGIUM EXCISION Bilateral    PTERYGIUM EXCISION Right 11/12/2013   Procedure: PTERYGIUM EXCISION WITH MITOMYCIN C AND AMNIOTIC MEMBRANE USING TISSEEL GLUE RIGHT EYE;  Surgeon: Chalmers Guest, MD;  Location: [facility] OR;  Service: Ophthalmology;  Laterality: Right;    Current Outpatient Medications  Medication Sig Dispense Refill   atorvastatin (LIPITOR) 20 MG tablet Take 1 tablet (20 mg total) by mouth daily. 90 tablet 3   lisinopril-hydrochlorothiazide (ZESTORETIC) 20-25 MG tablet Take 1 tablet by mouth daily. 30 tablet 2   metFORMIN (GLUCOPHAGE) 500 MG tablet Take 1 tablet (500 mg total) by mouth daily with breakfast. 30 tablet 2   blood glucose meter kit and supplies Dispense based on patient and insurance preference. Use up to four times daily as directed. (FOR ICD-10 E10.9, E11.9). (Patient not taking: Reported on 12/26/2021) 1 each 0   Blood Glucose Monitoring Suppl (TRUE METRIX METER) w/Device KIT Use to check blood sugar once daily. (Patient not taking: Reported on 01/04/2023) 1 kit 0   Cyanocobalamin (B-12 PO) Take 1 tablet by mouth daily as needed (energy). (Patient not taking: Reported on 01/04/2023)     pantoprazole (PROTONIX) 40 MG tablet Take 1 tablet (40 mg total) by mouth 2 (two) times daily before a meal. 60 tablet 3   No current facility-administered medications for this visit.    Allergies as of 01/04/2023 - Review Complete 05/22/2022  Allergen Reaction Noted   Squid oil Rash 08/28/2019    Family History  Problem Relation Age of Onset   Alzheimer's disease Mother    Emphysema Father    Colon cancer Neg Hx     Social History   Socioeconomic History    Marital status: Divorced    Spouse name: Not on file   Number of children: Not on file   Years of education: Not on file   Highest education level: Not on file  Occupational History   Not on file  Tobacco Use   Smoking status: Former    Packs/day: 1.00    Years: 13.00    Additional pack years: 0.00    Total pack years: 13.00    Types: Cigarettes    Quit date: 12/10/1998    Years since quitting: 24.0   Smokeless tobacco: Never  Vaping Use   Vaping Use: Never used  Substance and Sexual Activity   Alcohol use: No   Drug use: No   Sexual activity: Not on file  Other Topics Concern   Not on file  Social History Narrative   Needs interpreter for Spanish   Social Determinants of Health   Financial Resource Strain: Not on file  Food Insecurity: Not on file  Transportation Needs: Not on file  Physical Activity: Not on file  Stress: Not on file  Social Connections: Not on file  Review of Systems: Gen: Denies fever, chills, cold or flulike symptoms, presyncope, syncope. CV: Denies chest pain, palpitations. Resp: Denies dyspnea, cough. GI: See HPI Heme: See HPI  Physical Exam: BP 128/81 (BP Location: Right Arm, Patient Position: Sitting, Cuff Size: Large)   Pulse 88   Temp 97.9 F (36.6 C) (Temporal)   Ht 5\' 6"  (1.676 m)   Wt 221 lb 9.6 oz (100.5 kg)   SpO2 96%   BMI 35.77 kg/m  General:   Alert and oriented. No distress noted. Pleasant and cooperative.  Head:  Normocephalic and atraumatic. Eyes:  Conjuctiva clear without scleral icterus. Heart:  S1, S2 present without murmurs appreciated. Lungs:  Clear to auscultation bilaterally. No wheezes, rales, or rhonchi. No distress.  Abdomen:  +BS, soft, non-tender and non-distended. No rebound or guarding. No HSM or masses noted. Msk:  Symmetrical without gross deformities. Normal posture. Extremities:  Without edema. Neurologic:  Alert and  oriented x4 Psych:  Normal mood and affect.    Assessment:  52 year old  male with IDA now resolved, rectal bleeding secondary to hemorrhoids, constipation, GERD with esophagitis, Schatzki's ring requiring dilation previously, presenting today for further evaluation of GERD and dysphagia.  GERD: Previously well-controlled on pantoprazole, but stopped medication when he is feeling well and has recurrent symptoms.  Taking pantoprazole daily x 15 days, but still having reflux at least 3 days a week.  EGD last year with gastric biopsies negative for H. pylori.  Recommended increasing pantoprazole to twice daily for now.  Dysphagia: Recurrent solid food dysphagia.  Suspect esophagitis/recurrent Schatzki's ring in the setting of uncontrolled GERD due to inconsistently taking PPI.  Last EGD in June 2023 with grade a reflux esophagitis, mild Schatzki ring dilated.  Patient reports prior dilation resolved dysphagia.  We spent quite a bit of time today discussing that he needs to continue PPI daily even when he is feeling well to prevent recurrent reflux and recurrent Schatzki's ring.  Plan:  Proceed with upper endoscopy with propofol by Dr. Marletta Lor in near future. The risks, benefits, and alternatives have been discussed with the patient in detail. The patient states understanding and desires to proceed.  ASA 3 (per prior anesthesia grading) No AM diabetes medications morning of procedure. Increase pantoprazole to 40 mg twice daily x 12 weeks.  If feeling well after that, can decrease to once daily. Counseled on GERD diet/lifestyle.  Written instructions provided on AVS. Swallowing precautions discussed.  Written instructions provided on AVS. Follow-up after EGD.   Ermalinda Memos, PA-C [facility] 01/04/2023

## 2023-01-02 NOTE — Progress Notes (Unsigned)
Referring Provider: Rana Snare, DO Primary Care Physician:  Rana Snare, DO Primary GI Physician: Dr. Marletta Lor  No chief complaint on file.   HPI:   Ricardo Ward is a 52 y.o. male presenting today ***  History of IDA in the past, rectal bleeding secondary to hemorrhoids, constipation, and GERD***. Colonoscopy and EGD in January 2021. Colonoscopy with 5 mm sessile serrated polyp, diverticulosis, rectal bleeding due to internal hemorrhoids. Due for repeat colonoscopy in 2026. EGD with grade B esophagitis without bleeding, low-grade narrowing Schatzki's ring, localized mild inflammation in the stomach biopsied (chronic gastritis, negative for H. pylori). Overall, though normocytic anemia was due to erosive esophagitis, gastritis, and hemorrhoidal bleeding. He underwent internal and external hemorrhoidectomy in February 2021.  Anemia resolved.   Last seen in our office 12/26/2021.  Reported dysphagia for 4 months to solid and solid foods.  Nocturnal reflux if eating late which was about 3-4 times a week due to his work schedule.  He was taking Protonix once daily.  Acute epigastric discomfort.  Denied NSAIDs.  Protonix was increased to twice daily and he was scheduled for an EGD.  EGD 01/27/2022: Small hiatal hernia, grade a reflux esophagitis, mild Schatzki's ring dilated, gastritis biopsied.  Pathology with reactive gastropathy with mild chronic gastritis, negative for H. pylori.  Today:   Past Medical History:  Diagnosis Date   COVID-19 12/2018   s/p hospitalization and recovery   Diabetes (HCC)    GERD (gastroesophageal reflux disease)    HLD (hyperlipidemia)    HTN (hypertension)    Pterygium    rt eye   Pterygium of both eyes    removal    Past Surgical History:  Procedure Laterality Date   APPENDECTOMY     BACK SURGERY     BALLOON DILATION N/A 01/27/2022   Procedure: BALLOON DILATION;  Surgeon: Lanelle Bal, DO;  [facility];  Service: Endoscopy;   Laterality: N/A;   BIOPSY  09/03/2019   Procedure: BIOPSY;  Surgeon: West Bali, MD;  [facility];  Service: Endoscopy;;  gastric   BIOPSY  01/27/2022   Procedure: BIOPSY;  Surgeon: Lanelle Bal, DO;  [facility];  Service: Endoscopy;;   COLONOSCOPY N/A 09/03/2019    Surgeon: West Bali, MD;  5 mm sessile serrated polyp, diverticulosis, rectal bleeding due to internal hemorrhoids.  Due for repeat colonoscopy in 2026.   ESOPHAGOGASTRODUODENOSCOPY N/A 09/03/2019   Surgeon: West Bali, MD;  LA grade B esophagitis without bleeding, low-grade narrowing Schatzki's ring, localized mild inflammation in the stomach biopsied (chronic gastritis, negative for H. pylori).    ESOPHAGOGASTRODUODENOSCOPY (EGD) WITH PROPOFOL N/A 01/27/2022   Procedure: ESOPHAGOGASTRODUODENOSCOPY (EGD) WITH PROPOFOL;  Surgeon: Lanelle Bal, DO;  [facility];  Service: Endoscopy;  Laterality: N/A;  11:00am   EYE SURGERY Bilateral 06   pterygium    HEMORRHOID SURGERY N/A 10/01/2019   Procedure: HEMORRHOIDECTOMY EXTENSIVE;  Surgeon: Franky Macho, MD;  Location: [facility];  Service: General;  Laterality: N/A;   POLYPECTOMY  09/03/2019   Procedure: POLYPECTOMY;  Surgeon: West Bali, MD;  [facility];  Service: Endoscopy;;  hepatic flexure   PTERYGIUM EXCISION Bilateral    PTERYGIUM EXCISION Right 11/12/2013   Procedure: PTERYGIUM EXCISION WITH MITOMYCIN C AND AMNIOTIC MEMBRANE USING TISSEEL GLUE RIGHT EYE;  Surgeon: Chalmers Guest, MD;  Location: [facility] OR;  Service: Ophthalmology;  Laterality: Right;    Current Outpatient Medications  Medication Sig  Dispense Refill   atorvastatin (LIPITOR) 20 MG tablet Take 1 tablet (20 mg total) by mouth daily. 90 tablet 3   blood glucose meter kit and supplies Dispense based on patient and insurance preference. Use up to four times daily as directed. (FOR ICD-10 E10.9, E11.9). (Patient not taking: Reported on 12/26/2021) 1 each 0    Blood Glucose Monitoring Suppl (TRUE METRIX METER) w/Device KIT Use to check blood sugar once daily. 1 kit 0   Cyanocobalamin (B-12 PO) Take 1 tablet by mouth daily as needed (energy).     lisinopril-hydrochlorothiazide (ZESTORETIC) 20-25 MG tablet Take 1 tablet by mouth daily. 30 tablet 2   metFORMIN (GLUCOPHAGE) 500 MG tablet Take 1 tablet (500 mg total) by mouth daily with breakfast. 30 tablet 2   No current facility-administered medications for this visit.    Allergies as of 01/04/2023 - Review Complete 05/22/2022  Allergen Reaction Noted   Squid oil Rash 08/28/2019    Family History  Problem Relation Age of Onset   Alzheimer's disease Mother    Emphysema Father    Colon cancer Neg Hx     Social History   Socioeconomic History   Marital status: Divorced    Spouse name: Not on file   Number of children: Not on file   Years of education: Not on file   Highest education level: Not on file  Occupational History   Not on file  Tobacco Use   Smoking status: Former    Packs/day: 1.00    Years: 13.00    Additional pack years: 0.00    Total pack years: 13.00    Types: Cigarettes    Quit date: 12/10/1998    Years since quitting: 24.0   Smokeless tobacco: Never  Vaping Use   Vaping Use: Never used  Substance and Sexual Activity   Alcohol use: No   Drug use: No   Sexual activity: Not on file  Other Topics Concern   Not on file  Social History Narrative   Needs interpreter for Spanish   Social Determinants of Health   Financial Resource Strain: Not on file  Food Insecurity: Not on file  Transportation Needs: Not on file  Physical Activity: Not on file  Stress: Not on file  Social Connections: Not on file    Review of Systems: Gen: Denies fever, chills, anorexia. Denies fatigue, weakness, weight loss.  CV: Denies chest pain, palpitations, syncope, peripheral edema, and claudication. Resp: Denies dyspnea at rest, cough, wheezing, coughing up blood, and  pleurisy. GI: Denies vomiting blood, jaundice, and fecal incontinence.   Denies dysphagia or odynophagia. Derm: Denies rash, itching, dry skin Psych: Denies depression, anxiety, memory loss, confusion. No homicidal or suicidal ideation.  Heme: Denies bruising, bleeding, and enlarged lymph nodes.  Physical Exam: There were no vitals taken for this visit. General:   Alert and oriented. No distress noted. Pleasant and cooperative.  Head:  Normocephalic and atraumatic. Eyes:  Conjuctiva clear without scleral icterus. Heart:  S1, S2 present without murmurs appreciated. Lungs:  Clear to auscultation bilaterally. No wheezes, rales, or rhonchi. No distress.  Abdomen:  +BS, soft, non-tender and non-distended. No rebound or guarding. No HSM or masses noted. Msk:  Symmetrical without gross deformities. Normal posture. Extremities:  Without edema. Neurologic:  Alert and  oriented x4 Psych:  Normal mood and affect.    Assessment:     Plan:  ***   Ermalinda Memos, PA-C [facility] 01/04/2023

## 2023-01-04 ENCOUNTER — Other Ambulatory Visit: Payer: Self-pay

## 2023-01-04 ENCOUNTER — Encounter: Payer: Self-pay | Admitting: *Deleted

## 2023-01-04 ENCOUNTER — Encounter: Payer: Self-pay | Admitting: Gastroenterology

## 2023-01-04 ENCOUNTER — Other Ambulatory Visit: Payer: Self-pay | Admitting: Family Medicine

## 2023-01-04 ENCOUNTER — Ambulatory Visit (INDEPENDENT_AMBULATORY_CARE_PROVIDER_SITE_OTHER): Payer: Self-pay | Admitting: Gastroenterology

## 2023-01-04 VITALS — BP 128/81 | HR 88 | Temp 97.9°F | Ht 66.0 in | Wt 221.6 lb

## 2023-01-04 DIAGNOSIS — K219 Gastro-esophageal reflux disease without esophagitis: Secondary | ICD-10-CM

## 2023-01-04 DIAGNOSIS — E1159 Type 2 diabetes mellitus with other circulatory complications: Secondary | ICD-10-CM

## 2023-01-04 DIAGNOSIS — R1310 Dysphagia, unspecified: Secondary | ICD-10-CM

## 2023-01-04 DIAGNOSIS — E1169 Type 2 diabetes mellitus with other specified complication: Secondary | ICD-10-CM

## 2023-01-04 DIAGNOSIS — E119 Type 2 diabetes mellitus without complications: Secondary | ICD-10-CM

## 2023-01-04 MED ORDER — PANTOPRAZOLE SODIUM 40 MG PO TBEC
40.0000 mg | DELAYED_RELEASE_TABLET | Freq: Two times a day (BID) | ORAL | 3 refills | Status: DC
  Filled 2023-01-04: qty 60, 30d supply, fill #0

## 2023-01-04 NOTE — Patient Instructions (Addendum)
Spanish Instructions:  Aumente pantoprazol 40 mg dos veces al da 30 minutos antes del desayuno y la cena durante al menos 12 semanas. Si se siente bien despus de 12 semanas, puede disminuirlo a [employer].  Haremos los arreglos necesarios para que le realicen una endoscopia superior con posible estiramiento de su esfago en un futuro cercano con el Dr. Marletta Lor.  Siga una dieta para la ERGE: Evite los alimentos fritos, grasos, picantes y ctricos. Evite la cafena y las bebidas carbonatadas. Evite el chocolate. Intente comer de 4 a 6 comidas pequeas al Geophysical data processor de 3 comidas grandes. No coma dentro de las 3 horas posteriores a Teacher, music. Apoye la cabecera de la cama sobre [place] o ladrillos para crear una inclinacin de 6 pulgadas.  Precauciones para la deglucin: Coma despacio, tome bocados pequeos, mastique bien y beba muchos lquidos durante las comidas. Evite las texturas profundas Todas las carnes se deben picar finamente. Si algo se queda colgado en el esfago y no sube ni baja, dirjase a la sala de emergencias.  Nos veremos nuevamente despus de su procedimiento.   Ermalinda Memos, PA-C [facility]    English Instructions:  Increase pantoprazole 40 mg twice daily 30 minutes before breakfast and dinner for at least 12 weeks. If feeling well after 12 weeks, you can dec rased to twice daily.   We will arrange for you to to have an upper endoscopy with possible stretching of your esophagus in the near future with Dr. Marletta Lor.   Follow a GERD diet:  Avoid fried, fatty, greasy, spicy, citrus foods. Avoid caffeine and carbonated beverages. Avoid chocolate. Try eating 4-6 small meals a day rather than 3 large meals. Do not eat within 3 hours of laying down. Prop head of bed up on wood or bricks to create a 6 inch incline.  Swallowing precautions:  Eat slowly, take small bites, chew thoroughly, drink plenty of liquids throughout meals.  Avoid trough  textures All meats should be chopped finely.  If something gets hung in your esophagus and will not come up or go down, proceed to the emergency room.    We will see you back after your procedure.   Ermalinda Memos, PA-C [facility]

## 2023-01-05 NOTE — Telephone Encounter (Signed)
Patient has a appointment scheduled for 5/23.

## 2023-01-11 ENCOUNTER — Encounter: Payer: Self-pay | Admitting: Student

## 2023-01-11 ENCOUNTER — Other Ambulatory Visit (HOSPITAL_COMMUNITY): Payer: Self-pay

## 2023-01-11 ENCOUNTER — Ambulatory Visit (INDEPENDENT_AMBULATORY_CARE_PROVIDER_SITE_OTHER): Payer: Self-pay | Admitting: Student

## 2023-01-11 VITALS — BP 136/80 | HR 89 | Temp 99.5°F | Wt 218.6 lb

## 2023-01-11 DIAGNOSIS — I152 Hypertension secondary to endocrine disorders: Secondary | ICD-10-CM

## 2023-01-11 DIAGNOSIS — D500 Iron deficiency anemia secondary to blood loss (chronic): Secondary | ICD-10-CM

## 2023-01-11 DIAGNOSIS — K219 Gastro-esophageal reflux disease without esophagitis: Secondary | ICD-10-CM

## 2023-01-11 DIAGNOSIS — E1169 Type 2 diabetes mellitus with other specified complication: Secondary | ICD-10-CM

## 2023-01-11 DIAGNOSIS — E785 Hyperlipidemia, unspecified: Secondary | ICD-10-CM

## 2023-01-11 DIAGNOSIS — Z7984 Long term (current) use of oral hypoglycemic drugs: Secondary | ICD-10-CM

## 2023-01-11 DIAGNOSIS — E119 Type 2 diabetes mellitus without complications: Secondary | ICD-10-CM

## 2023-01-11 DIAGNOSIS — E1159 Type 2 diabetes mellitus with other circulatory complications: Secondary | ICD-10-CM

## 2023-01-11 LAB — POCT GLYCOSYLATED HEMOGLOBIN (HGB A1C): Hemoglobin A1C: 6.3 % — AB (ref 4.0–5.6)

## 2023-01-11 LAB — GLUCOSE, CAPILLARY: Glucose-Capillary: 130 mg/dL — ABNORMAL HIGH (ref 70–99)

## 2023-01-11 MED ORDER — METFORMIN HCL 500 MG PO TABS
500.0000 mg | ORAL_TABLET | Freq: Every day | ORAL | 3 refills | Status: DC
Start: 2023-01-11 — End: 2024-02-06
  Filled 2023-01-11: qty 30, 30d supply, fill #0
  Filled 2023-02-13: qty 30, 30d supply, fill #1
  Filled 2023-04-11: qty 30, 30d supply, fill #2
  Filled 2023-05-31: qty 30, 30d supply, fill #3
  Filled 2023-07-03: qty 30, 30d supply, fill #4
  Filled 2023-08-23: qty 30, 30d supply, fill #5
  Filled 2023-10-11: qty 30, 30d supply, fill #6
  Filled 2023-11-27: qty 30, 30d supply, fill #7
  Filled 2024-01-10: qty 30, 30d supply, fill #8

## 2023-01-11 MED ORDER — ATORVASTATIN CALCIUM 20 MG PO TABS
20.0000 mg | ORAL_TABLET | Freq: Every day | ORAL | 3 refills | Status: DC
Start: 2023-01-11 — End: 2024-02-06
  Filled 2023-01-11: qty 90, 90d supply, fill #0
  Filled 2023-02-13: qty 30, 30d supply, fill #0
  Filled 2023-04-11: qty 30, 30d supply, fill #1
  Filled 2023-05-31: qty 30, 30d supply, fill #2
  Filled 2023-07-03: qty 30, 30d supply, fill #3
  Filled 2023-08-23: qty 30, 30d supply, fill #4
  Filled 2023-10-11: qty 30, 30d supply, fill #5
  Filled 2023-11-27: qty 30, 30d supply, fill #6
  Filled 2024-01-10: qty 30, 30d supply, fill #7

## 2023-01-11 MED ORDER — LISINOPRIL-HYDROCHLOROTHIAZIDE 20-25 MG PO TABS
1.0000 | ORAL_TABLET | Freq: Every day | ORAL | 3 refills | Status: DC
Start: 2023-01-11 — End: 2024-02-06
  Filled 2023-01-11: qty 30, 30d supply, fill #0
  Filled 2023-02-13: qty 30, 30d supply, fill #1
  Filled 2023-04-11: qty 30, 30d supply, fill #2
  Filled 2023-05-31: qty 30, 30d supply, fill #3
  Filled 2023-07-03: qty 30, 30d supply, fill #4
  Filled 2023-08-23: qty 30, 30d supply, fill #5
  Filled 2023-10-11: qty 30, 30d supply, fill #6
  Filled 2023-11-27: qty 30, 30d supply, fill #7
  Filled 2024-01-10: qty 30, 30d supply, fill #8

## 2023-01-11 NOTE — Assessment & Plan Note (Signed)
Following with GI for GERD and dysphagia. Taking Protonix 40 mg BID. Plan for EGD on 6/14.

## 2023-01-11 NOTE — Patient Instructions (Addendum)
Ricardo Ward, Sr. Jos Daz-Cruz por permitirnos brindarle su atencin hoy. Hoy hablamos de su presin arterial, diabetes y reabastecimiento de medicamentos.   -La presin arterial va bien. Contine con lisinopril-HCTZ diariamente. Recarga enviada hoy.  -La A1c mejor un 6,3% para su diabetes. Contine con metformina diariamente. Recarga enviada hoy.  -Recarga enviada hoy de atorvastatina para su colesterol.   -Hoy le harn anlisis de sangre para comprobar electrolitos, funcin renal y hemograma.  ------------------------------- Thank you, Ricardo Ward for allowing [place] to provide your care today. Today we discussed your blood pressure, diabetes and medication refills.   -Blood pressure doing well. Continue with lisinopril-HCTZ daily. Refill sent today.  -A1c improved 6.3% for your diabetes. Continue with metformin daily. Refill sent today.  -Refill sent today for atorvastatin for your cholesterol.   -Will do blood work today to check electrolytes, kidney function and blood count.   I have ordered the following labs for you: Lab Orders         Glucose, capillary         BMP8+Anion Gap         CBC no Diff         POC Hbg A1C      I have ordered the following medication/changed the following medications:   Stop the following medications: Medications Discontinued During This Encounter  Medication Reason   lisinopril-hydrochlorothiazide (ZESTORETIC) 20-25 MG tablet Reorder   atorvastatin (LIPITOR) 20 MG tablet Reorder   metFORMIN (GLUCOPHAGE) 500 MG tablet Reorder     Start the following medications: Meds ordered this encounter  Medications   metFORMIN (GLUCOPHAGE) 500 MG tablet    Sig: Take 1 tablet (500 mg total) by mouth daily with breakfast.    Dispense:  90 tablet    Refill:  3    IM program   lisinopril-hydrochlorothiazide (ZESTORETIC) 20-25 MG tablet    Sig: Take 1 tablet by mouth daily.    Dispense:  90 tablet    Refill:  3    IM program   atorvastatin (LIPITOR) 20 MG  tablet    Sig: Take 1 tablet (20 mg total) by mouth daily.    Dispense:  90 tablet    Refill:  3    IM program     Follow up: 4-6 months   Should you have any questions or concerns please call the internal medicine clinic at [phone].    Rana Snare, D.O. [facility]

## 2023-01-11 NOTE — Assessment & Plan Note (Signed)
Lipid panel in 2022 showed LDL 69. He is taking atorvastatin 20 mg daily. Pending orange card to repeat lipid panel.   Plan -continue atorvastatin, refill Rx today for 90 day supply (IM program)

## 2023-01-11 NOTE — Assessment & Plan Note (Signed)
Well controlled. A1c 6.3% today. He is taking metformin 500 mg daily. He is trying to adhere to taking it daily. Tolerating well without any reported side effects. BMP today. Pending orange card to complete urine microalbumin and ophthalmology exam.   Plan -continue metformin, refill Rx today for 90 day supply (IM program)  -BMP today

## 2023-01-11 NOTE — Progress Notes (Signed)
   CC: medication refill  HPI:  [name] is a 51 y.o. male living with a history stated below and presents today for medication refill. Please see problem based assessment and plan for additional details.  Virtual interpreter used during this encounter.   Past Medical History:  Diagnosis Date   COVID-19 12/2018   s/p hospitalization and recovery   Diabetes (HCC)    GERD (gastroesophageal reflux disease)    HLD (hyperlipidemia)    HTN (hypertension)    Pterygium    rt eye   Pterygium of both eyes    removal   Current Outpatient Medications  Medication Instructions   atorvastatin (LIPITOR) 20 mg, Oral, Daily   blood glucose meter kit and supplies Dispense based on patient and insurance preference. Use up to four times daily as directed. (FOR ICD-10 E10.9, E11.9).   Blood Glucose Monitoring Suppl (TRUE METRIX METER) w/Device KIT Use to check blood sugar once daily.   Cyanocobalamin (B-12 PO) 1 tablet, Daily PRN   lisinopril-hydrochlorothiazide (ZESTORETIC) 20-25 MG tablet 1 tablet, Oral, Daily   metFORMIN (GLUCOPHAGE) 500 mg, Oral, Daily with breakfast   pantoprazole (PROTONIX) 40 mg, Oral, 2 times daily before meals   Review of Systems: ROS negative except for what is noted on the assessment and plan.  Vitals:   01/11/23 1523  BP: 136/80  Pulse: 89  Temp: 99.5 F (37.5 C)  TempSrc: Oral  SpO2: 97%  Weight: 218 lb 9.6 oz (99.2 kg)   Physical Exam: Constitutional: well-appearing male sitting in chair comfortably, in no acute distress Cardiovascular: regular rate and rhythm Pulmonary/Chest: normal work of breathing on room air, lungs clear to auscultation bilaterally MSK: normal bulk and tone Neurological: alert & oriented x 3 Skin: warm and dry Psych: pleasant mood  Assessment & Plan:   Hypertension associated with type 2 diabetes mellitus (HCC) BP controlled today at 136/80 on lisinopril-HCTZ 20-25 mg daily. He is trying to adhere to taking it daily.  Last BMP WNL in 01/2022. Agreeable to repeat BMP today, still without insurance, he is resubmitting orange card application.   Plan -continue lisinopril-HCTZ, refill Rx today for 90 day supply (IM program) -BMP today   Type 2 diabetes mellitus (HCC) Well controlled. A1c 6.3% today. He is taking metformin 500 mg daily. He is trying to adhere to taking it daily. Tolerating well without any reported side effects. BMP today. Pending orange card to complete urine microalbumin and ophthalmology exam.   Plan -continue metformin, refill Rx today for 90 day supply (IM program)  -BMP today  Hyperlipidemia associated with type 2 diabetes mellitus (HCC) Lipid panel in 2022 showed LDL 69. He is taking atorvastatin 20 mg daily. Pending orange card to repeat lipid panel.   Plan -continue atorvastatin, refill Rx today for 90 day supply (IM program)  Gastroesophageal reflux disease Following with GI for GERD and dysphagia. Taking Protonix 40 mg BID. Plan for EGD on 6/14.    Patient discussed with Dr. Rondall Allegra, D.O. [facility] Internal Medicine, PGY-1 Phone: [phone] Date 01/11/2023 Time 7:13 PM

## 2023-01-11 NOTE — Assessment & Plan Note (Addendum)
BP controlled today at 136/80 on lisinopril-HCTZ 20-25 mg daily. He is trying to adhere to taking it daily. Last BMP WNL in 01/2022. Agreeable to repeat BMP today, still without insurance, he is resubmitting orange card application.   Plan -continue lisinopril-HCTZ, refill Rx today for 90 day supply (IM program) -BMP today

## 2023-01-12 LAB — CBC
Hematocrit: 40.5 % (ref 37.5–51.0)
Hemoglobin: 13.7 g/dL (ref 13.0–17.7)
MCH: 30 pg (ref 26.6–33.0)
MCHC: 33.8 g/dL (ref 31.5–35.7)
MCV: 89 fL (ref 79–97)
Platelets: 317 10*3/uL (ref 150–450)
RBC: 4.57 x10E6/uL (ref 4.14–5.80)
RDW: 13.4 % (ref 11.6–15.4)
WBC: 6.5 10*3/uL (ref 3.4–10.8)

## 2023-01-12 LAB — BMP8+ANION GAP
Anion Gap: 16 mmol/L (ref 10.0–18.0)
BUN/Creatinine Ratio: 20 (ref 9–20)
BUN: 16 mg/dL (ref 6–24)
CO2: 20 mmol/L (ref 20–29)
Calcium: 9.7 mg/dL (ref 8.7–10.2)
Chloride: 107 mmol/L — ABNORMAL HIGH (ref 96–106)
Creatinine, Ser: 0.81 mg/dL (ref 0.76–1.27)
Glucose: 109 mg/dL — ABNORMAL HIGH (ref 70–99)
Potassium: 3.8 mmol/L (ref 3.5–5.2)
Sodium: 143 mmol/L (ref 134–144)
eGFR: 105 mL/min/{1.73_m2} (ref >59)

## 2023-01-18 NOTE — Progress Notes (Signed)
Internal Medicine Clinic Attending  Case discussed with Dr. Zheng  At the time of the visit.  We reviewed the resident's history and exam and pertinent patient test results.  I agree with the assessment, diagnosis, and plan of care documented in the resident's note.  

## 2023-01-30 NOTE — Patient Instructions (Addendum)
Instrucciones Para Antes de la Ciruga   Su ciruga est programada para-(your procedure is scheduled on)  02/02/2023 @ 1215 PM.    Ricardo Ward - (Enter)    Por favor llame al [phone] si tiene algn problema en la maana de la ciruga. (Please call if you have any problems the morning of surgery.)                       Tome estas medicinas en la maana de la ciruga con un SORBITO de agua (Take these meds the morning of surgery with a SIP of water)                                      pantoprazole.    Puede cepillarse los dientes en la maana de la [place]. (You may brush your teeth the morning of surgery)   No use joyas, maquillaje para los ojos, lpiz labial, locin corporal ni esmalte de uas oscuro, incluido el esmalte en gel, uas artificiales ni ningn otro tipo de [employer] uas naturales (dedos de manos y pies).  (Do not wear jewelry, eye makeup, lipstick, body lotion, or dark fingernail polish, including gel polish, artificial nails, or any other type of covering on natural nails (fingers and toes).   No puede usar desodorante. (You may wear deodorant)   Si va a ser ingresado despues de la ciruga, deje la maleta en el carro hasta que se le haya asignado una habitacin. (If you are to be admitted after surgery, leave suitcase in car until your room has been assigned.)   A los pacientes que se les d de alta el mismo da no se les permitir manejar a casa.  (Patients discharged on the day of surgery will not be allowed to drive home)   Use ropa suelta y cmoda de regreso a casa. (Wear loose comfortable clothes for ride home)    Endoscopa alta en los adultos, cuidados posteriores Upper Endoscopy, Adult, Care After Despus del procedimiento, es normal tener dolor de Advertising copywriter. Tambin es normal tener lo siguiente: Leve dolor o Environmental manager. Distensin abdominal. Nuseas. Siga estas  indicaciones en su casa: Las siguientes instrucciones pueden ayudarlo a cuidarse en casa. El mdico podr darle instrucciones ms especficas. Hable con su mdico si tiene preguntas. Si le administraron un sedante durante el procedimiento, puede afectarlo por varias horas. No conduzca ni opere maquinaria hasta que el mdico le indique que es seguro [place]. Si va a marcharse a su casa inmediatamente despus del procedimiento, pdale a un adulto responsable que: Lo lleve a su casa desde el hospital o la clnica. No se le permitir conducir. Lo cuide durante el [employer] indiquen. Siga las instrucciones del mdico respecto de lo que puede comer y beber. Retome sus actividades normales como se lo haya indicado el mdico. Pregntele al mdico qu actividades son seguras para usted. Use los medicamentos de venta libre y los recetados solamente como se lo haya indicado el mdico. Comunquese con un mdico si: Tiene dolor de garganta que dura ms de Civil engineer, contracting. Tiene dificultad para tragar.  Tiene fiebre. Solicite ayuda de inmediato si: Vomita sangre o el vmito tiene un aspecto similar al poso del caf. Las heces son sanguinolentas, negras o alquitranadas. Tiene un dolor de garganta muy intenso o no puede tragar. Tiene dificultad para respirar o dolor muy intenso en el pecho o el abdomen. Estos sntomas pueden Customer service manager. Solicite ayuda de inmediato. Llame al 911. No espere a ver si los sntomas desaparecen. No conduzca por sus propios medios [employer]. Resumen Despus del procedimiento, es normal sentir dolor de garganta, molestias leves en el estmago, distensin abdominal y nuseas. Si le administraron un sedante durante el procedimiento, puede afectarlo por varias horas. No conduzca hasta que el mdico diga que es [place]. Siga las instrucciones del mdico respecto de lo que puede comer y beber. Retome sus actividades normales como se lo haya indicado el mdico. Esta  informacin no tiene Theme park manager el consejo del mdico. Asegrese de hacerle al mdico cualquier pregunta que tenga. Document Revised: 12/13/2021 Document Reviewed: 12/13/2021 Elsevier Patient Education  2024 Elsevier Inc. Dilatacin esofgica Esophageal Dilatation La dilatacin esofgica es un procedimiento que se realiza para Public relations account executive o abrir una parte del esfago que se ha obstruido o Therapist, nutritional. El esfago es la parte del cuerpo que transporta los alimentos y lquidos desde la boca hasta el [place]. Este procedimiento puede ser [employer] siguientes casos: Tiene una acumulacin de tejido cicatricial en el esfago que hace que sea difcil, doloroso o imposible tragar. Esto puede ser causado por la enfermedad por reflujo gastroesofgico (ERGE). Tiene cncer de esfago. Hay un problema con la forma en que los alimentos se mueven a travs del esfago. En algunos casos, es posible que este procedimiento se deba volver a repetir en un momento posterior para dilatar el esfago en forma gradual. Informe al mdico acerca de lo siguiente: Cualquier alergia que tenga. Todos los [employer] [place], incluidos vitaminas, hierbas, gotas oftlmicas, cremas y [address] de [address]. Problemas previos que usted o algn miembro de su familia hayan tenido con los anestsicos. Cualquier trastorno de la sangre que tenga. Cirugas a las que se haya sometido. Cualquier afeccin mdica que tenga. Antibiticos que deba tomar antes de procedimientos dentales. Si est embarazada o podra estarlo. Cules son los riesgos? En general, se trata de un procedimiento seguro. Sin embargo, pueden ocurrir complicaciones, por ejemplo: Sangrado debido a Counsellor revestimiento del esfago. Un agujero, o perforacin, en el esfago. Qu ocurre antes del procedimiento? Consulte al mdico sobre: Multimedia programmer o suspender los medicamentos que [place] habitualmente. Esto es muy importante si toma medicamentos  para la diabetes o anticoagulantes. Tomar medicamentos como aspirina e ibuprofeno. Estos medicamentos pueden tener un efecto anticoagulante en la [place]. No tome estos medicamentos a menos que el mdico se lo indique. Usar medicamentos de venta libre, vitaminas, hierbas y suplementos. Siga las instrucciones del mdico respecto de las restricciones de comidas o bebidas. Haga que un adulto responsable lo lleve a su casa desde el hospital o la clnica. Haga que un adulto responsable lo cuide durante el tiempo que le indiquen despus de que le den el alta del hospital o de la clnica. Esto es importante. Qu ocurre durante el procedimiento? Le podrn administrar un medicamento para ayudar a relajarse (sedante). Se le puede rociar un anestsico en la parte posterior de la garganta, o usted puede IT sales professional. El mdico puede realizar la dilatacin usando varios instrumentos quirrgicos, tales como: Dilatador simple. Este instrumento se Psychologist, clinical en  el esfago para estirarlo. Dilatador guiado por alambre. Esto implica usar un endoscopio para introducir un Orthoptist. Un dilatador se introduce sobre este alambre para Physiological scientist. Luego, se retira el alambre. Dilatador con baln. Un endoscopio con un pequeo baln se introduce en el esfago. El baln se infla para estirar el esfago y abrirlo. El procedimiento puede variar segn el mdico y el hospital. Qu puedo esperar despus del procedimiento? Le controlarn la presin arterial, la frecuencia cardaca, la frecuencia respiratoria y Air cabin crew de oxgeno en la sangre hasta que le den el alta del hospital o la clnica. Puede sentir algo de dolor y adormecimiento en la garganta. Esto mejorar con [employer]. No podr comer ni beber hasta que el adormecimiento de su garganta haya desaparecido. Cuando pueda beber, Geographical information systems officer y sentarse en el borde de la cama sin sentir nuseas o Research scientist (life sciences), podr regresar a su  casa. Siga estas instrucciones en su casa: Use los medicamentos de venta libre y los recetados solamente como se lo haya indicado el mdico. Si le administraron un sedante durante el procedimiento, puede afectarlo por varias horas. No conduzca ni opere maquinaria hasta que el mdico le indique que es seguro [place]. Haga que un adulto responsable lo cuide durante el tiempo que le indiquen. Esto es importante. Siga las instrucciones del mdico respecto de las restricciones en las comidas o las bebidas. No consuma ningn producto que contenga nicotina o tabaco, como cigarrillos, cigarrillos electrnicos y tabaco de Theatre manager. Si necesita ayuda para dejar de consumir estos productos, consulte al [employer]. Cumpla con todas las visitas de seguimiento. Esto es importante. Comunquese con un mdico si: Tiene fiebre. Siente un dolor que no se alivia con medicamentos. Solicite ayuda de inmediato si: Midwife. Tiene dificultad para respirar. Tiene dificultad para tragar. Vomita sangre. La materia fecal es negra, de aspecto alquitranado o sanguinolenta. Estos sntomas pueden representar un problema grave que constituye Radio broadcast assistant. No espere a ver si los sntomas desaparecen. Solicite atencin mdica de inmediato. Comunquese con el servicio de emergencias de su localidad (911 en los [place]). No conduzca por sus propios medios [employer] hospital. Resumen La dilatacin esofgica es un procedimiento que se realiza para Public relations account executive o abrir una parte del esfago que se ha obstruido o Therapist, nutritional. Haga que un adulto responsable lo lleve a su casa desde el hospital o la clnica. Para este procedimiento, se le puede rociar un anestsico en la parte posterior de la garganta, o usted puede IT sales professional. No conduzca ni opere maquinaria hasta que el mdico le indique que es seguro [place]. Esta informacin no tiene Theme park manager el consejo del mdico. Asegrese de hacerle  al mdico cualquier pregunta que tenga. Document Revised: 02/20/2020 Document Reviewed: 02/20/2020 Elsevier Patient Education  2024 Elsevier Inc.  La siguiente informacin ofrece orientacin sobre cmo cuidarse despus del procedimiento. El mdico tambin podr darle instrucciones ms especficas. Comunquese con el mdico si tiene problemas o preguntas. Qu puedo esperar despus del procedimiento? Despus del procedimiento, es normal tener los siguientes sntomas: Cansancio. Poca o nula memoria sobre lo que ocurri durante el procedimiento o despus. Incapacidad para tomar decisiones. Nuseas o vmitos. Algo de problemas con el equilibrio. Siga estas instrucciones en su casa: Durante el perodo de [employer] le haya indicado el mdico:  Descanse. No participe en actividades que impliquen posibles cadas o lesiones. No conduzca ni opere maquinaria. No beba alcohol. No tome somnferos ni medicamentos que causen somnolencia. No  tome decisiones trascendentes ni firme documentos importantes. No cuide a nios por su cuenta. Medicamentos Use los medicamentos de venta libre y los recetados solamente como lo haya indicado el mdico. Si le recetaron antibiticos, tmelos como se lo haya indicado el mdico. No deje de usar el antibitico aunque comience a Actor. Comida y bebida Siga las instrucciones del mdico respecto de lo que puede comer y Product manager. Beber suficiente lquido como para Pharmacologist la orina de color amarillo plido. Si vomita: En la medida en que pueda, beba lquidos transparentes lentamente y en pequeas cantidades. Los lquidos transparentes incluyen agua, cubitos de hielo, [place] deportivas bajas en caloras y [place] de fruta rebajado con agua (jugo de fruta diluido). En la medida en que pueda, consuma alimentos livianos y suaves en pequeas cantidades. Estos alimentos incluyen bananas, compota de [place], arroz, carnes [place], tostadas y [address]. Instrucciones  generales  Pida a un adulto responsable que se quede con usted durante el tiempo que se le indique. Es importante tener a alguien que lo ayude a cuidarse hasta que est despierto y Research officer, political party. Si tiene apnea del sueo, la [place] y algunos medicamentos pueden elevar su riesgo de tener problemas respiratorios. Siga las instrucciones del mdico respecto al uso del dispositivo para dormir: Cuando est durmiendo. Incluso durante las siestas diurnas. Mientras tome analgsicos recetados, medicamentos para dormir o medicamentos que producen somnolencia. No consuma ningn producto que contenga nicotina o tabaco. Estos productos incluyen cigarrillos, tabaco para Theatre manager y aparatos de vapeo, como los Administrator, Civil Service. Si necesita ayuda para dejar de fumar, consulte al mdico. Comunquese con un mdico si: Sienta nuseas o vomita cada vez que come o bebe. Siente que va a desvanecerse. An est somnoliento y tiene dificultad para mantener el equilibrio despus de 24 horas. Aparece una erupcin cutnea. Tiene fiebre. Tiene enrojecimiento o hinchazn alrededor del lugar de la va intravenosa (i.v.). Solicite ayuda de inmediato si: Tiene dificultad para respirar. Tiene nueva confusin despus de llegar a casa. Estos sntomas pueden Customer service manager. Solicite ayuda de inmediato. Llame al 911. No espere a ver si los sntomas desaparecen. No conduzca por sus propios medios [employer]. Esta informacin no tiene Theme park manager el consejo del mdico. Asegrese de hacerle al mdico cualquier pregunta que tenga. Document Revised: 01/26/2022 Document Reviewed: 01/26/2022 Elsevier Patient Education  2024 [employer].

## 2023-02-01 ENCOUNTER — Encounter (HOSPITAL_COMMUNITY)
Admission: RE | Admit: 2023-02-01 | Discharge: 2023-02-01 | Disposition: A | Discharge status: Home / Self Care | Payer: Self-pay | Source: Ambulatory Visit | Attending: Internal Medicine | Admitting: Internal Medicine

## 2023-02-01 ENCOUNTER — Other Ambulatory Visit: Payer: Self-pay

## 2023-02-01 ENCOUNTER — Ambulatory Visit (HOSPITAL_COMMUNITY): Payer: Self-pay | Admitting: Anesthesiology

## 2023-02-01 ENCOUNTER — Encounter (HOSPITAL_COMMUNITY): Payer: Self-pay

## 2023-02-01 VITALS — BP 136/80 | HR 89 | Temp 98.4°F | Resp 18 | Ht 66.0 in | Wt 218.6 lb

## 2023-02-01 DIAGNOSIS — Z01810 Encounter for preprocedural cardiovascular examination: Secondary | ICD-10-CM | POA: Insufficient documentation

## 2023-02-01 DIAGNOSIS — I152 Hypertension secondary to endocrine disorders: Secondary | ICD-10-CM

## 2023-02-01 DIAGNOSIS — E1159 Type 2 diabetes mellitus with other circulatory complications: Secondary | ICD-10-CM | POA: Insufficient documentation

## 2023-02-02 ENCOUNTER — Ambulatory Visit (HOSPITAL_COMMUNITY): Payer: Self-pay | Admitting: Certified Registered"

## 2023-02-02 ENCOUNTER — Encounter (HOSPITAL_COMMUNITY)
Admission: RE | Disposition: A | Discharge status: Home / Self Care | Payer: Self-pay | Source: Home / Self Care | Attending: Internal Medicine

## 2023-02-02 ENCOUNTER — Other Ambulatory Visit (HOSPITAL_COMMUNITY): Payer: Self-pay

## 2023-02-02 ENCOUNTER — Ambulatory Visit (HOSPITAL_COMMUNITY)
Admission: RE | Admit: 2023-02-02 | Discharge: 2023-02-02 | Disposition: A | Discharge status: Home / Self Care | Payer: Self-pay | Attending: Internal Medicine | Admitting: Internal Medicine

## 2023-02-02 ENCOUNTER — Ambulatory Visit (HOSPITAL_COMMUNITY): Admit: 2023-02-02 | Payer: Self-pay | Admitting: Gastroenterology

## 2023-02-02 ENCOUNTER — Encounter (HOSPITAL_COMMUNITY): Payer: Self-pay

## 2023-02-02 ENCOUNTER — Ambulatory Visit (HOSPITAL_BASED_OUTPATIENT_CLINIC_OR_DEPARTMENT_OTHER): Payer: Self-pay | Admitting: Certified Registered"

## 2023-02-02 ENCOUNTER — Telehealth: Payer: Self-pay | Admitting: Gastroenterology

## 2023-02-02 DIAGNOSIS — R1310 Dysphagia, unspecified: Secondary | ICD-10-CM

## 2023-02-02 DIAGNOSIS — Z7984 Long term (current) use of oral hypoglycemic drugs: Secondary | ICD-10-CM | POA: Insufficient documentation

## 2023-02-02 DIAGNOSIS — E119 Type 2 diabetes mellitus without complications: Secondary | ICD-10-CM

## 2023-02-02 DIAGNOSIS — K209 Esophagitis, unspecified without bleeding: Secondary | ICD-10-CM

## 2023-02-02 DIAGNOSIS — I10 Essential (primary) hypertension: Secondary | ICD-10-CM

## 2023-02-02 DIAGNOSIS — K222 Esophageal obstruction: Secondary | ICD-10-CM

## 2023-02-02 DIAGNOSIS — K449 Diaphragmatic hernia without obstruction or gangrene: Secondary | ICD-10-CM

## 2023-02-02 DIAGNOSIS — K2100 Gastro-esophageal reflux disease with esophagitis, without bleeding: Secondary | ICD-10-CM | POA: Insufficient documentation

## 2023-02-02 DIAGNOSIS — Z87891 Personal history of nicotine dependence: Secondary | ICD-10-CM

## 2023-02-02 DIAGNOSIS — K219 Gastro-esophageal reflux disease without esophagitis: Secondary | ICD-10-CM

## 2023-02-02 DIAGNOSIS — Z79899 Other long term (current) drug therapy: Secondary | ICD-10-CM | POA: Insufficient documentation

## 2023-02-02 HISTORY — PX: ESOPHAGEAL DILATION: SHX303

## 2023-02-02 HISTORY — PX: ESOPHAGOGASTRODUODENOSCOPY (EGD) WITH PROPOFOL: SHX5813

## 2023-02-02 LAB — GLUCOSE, CAPILLARY: Glucose-Capillary: 88 mg/dL (ref 70–99)

## 2023-02-02 SURGERY — ESOPHAGOGASTRODUODENOSCOPY (EGD) WITH PROPOFOL
Anesthesia: General

## 2023-02-02 MED ORDER — EPHEDRINE 5 MG/ML INJ
INTRAVENOUS | Status: AC
  Filled 2023-02-02: qty 5

## 2023-02-02 MED ORDER — LIDOCAINE HCL (CARDIAC) PF 100 MG/5ML IV SOSY
PREFILLED_SYRINGE | INTRAVENOUS | Status: DC | PRN
  Administered 2023-02-02: 50 mg via INTRAVENOUS

## 2023-02-02 MED ORDER — LACTATED RINGERS IV SOLN: INTRAVENOUS | Status: DC

## 2023-02-02 MED ORDER — EPHEDRINE SULFATE-NACL 50-0.9 MG/10ML-% IV SOSY
PREFILLED_SYRINGE | INTRAVENOUS | Status: DC | PRN
  Administered 2023-02-02 (×2): 5 mg via INTRAVENOUS

## 2023-02-02 MED ORDER — OMEPRAZOLE 40 MG PO CPDR
40.0000 mg | DELAYED_RELEASE_CAPSULE | Freq: Two times a day (BID) | ORAL | 1 refills | Status: DC
  Filled 2023-02-02: qty 180, 90d supply, fill #0
  Filled 2023-02-13: qty 60, 30d supply, fill #1
  Filled 2023-04-11: qty 180, 90d supply, fill #1

## 2023-02-02 MED ORDER — PROPOFOL 10 MG/ML IV BOLUS
INTRAVENOUS | Status: DC | PRN
  Administered 2023-02-02: 70 mg via INTRAVENOUS
  Administered 2023-02-02: 50 mg via INTRAVENOUS
  Administered 2023-02-02: 100 mg via INTRAVENOUS

## 2023-02-02 MED ORDER — PROPOFOL 500 MG/50ML IV EMUL
INTRAVENOUS | Status: DC | PRN
  Administered 2023-02-02: 150 ug/kg/min via INTRAVENOUS

## 2023-02-02 NOTE — Anesthesia Procedure Notes (Signed)
Date/Time: 02/02/2023 2:21 PM  Performed by: Julian Reil, CRNAPre-anesthesia Checklist: Patient identified, Emergency Drugs available, Suction available and Patient being monitored Patient Re-evaluated:Patient Re-evaluated prior to induction Oxygen Delivery Method: Nasal cannula Induction Type: IV induction Placement Confirmation: positive ETCO2

## 2023-02-02 NOTE — Transfer of Care (Signed)
Immediate Anesthesia Transfer of Care Note  Patient: Ricardo Ward  Procedure(s) Performed: ESOPHAGOGASTRODUODENOSCOPY (EGD) WITH PROPOFOL ESOPHAGEAL DILATION  Patient Location: Endoscopy Unit  Anesthesia Type:General  Level of Consciousness: drowsy  Airway & Oxygen Therapy: Patient Spontanous Breathing  Post-op Assessment: Report given to RN and Post -op Vital signs reviewed and stable  Post vital signs: Reviewed and stable  Last Vitals:  Vitals Value Taken Time  BP    Temp    Pulse    Resp    SpO2      Last Pain:  Vitals:   02/02/23 1416  PainSc: 0-No pain         Complications: No notable events documented.

## 2023-02-02 NOTE — Anesthesia Preprocedure Evaluation (Signed)
Anesthesia Evaluation  Patient identified by MRN, date of birth, ID band Patient awake    Reviewed: Allergy & Precautions, H&P , NPO status , Patient's Chart, lab work & pertinent test results  Airway Mallampati: II  TM Distance: >3 FB Neck ROM: Full    Dental no notable dental hx. (+) Dental Advisory Given, Missing   Pulmonary neg pulmonary ROS, former smoker   Pulmonary exam normal breath sounds clear to auscultation       Cardiovascular Exercise Tolerance: Good hypertension, Pt. on medications Normal cardiovascular exam Rhythm:Regular Rate:Normal     Neuro/Psych negative neurological ROS  negative psych ROS   GI/Hepatic Neg liver ROS,GERD  Medicated and Controlled,,  Endo/Other  diabetes, Well Controlled, Type 2, Oral Hypoglycemic Agents    Renal/GU negative Renal ROS  negative genitourinary   Musculoskeletal negative musculoskeletal ROS (+)    Abdominal   Peds negative pediatric ROS (+)  Hematology  (+) Blood dyscrasia, anemia   Anesthesia Other Findings   Reproductive/Obstetrics negative OB ROS                             Anesthesia Physical Anesthesia Plan  ASA: 2  Anesthesia Plan: General   Post-op Pain Management: Minimal or no pain anticipated   Induction: Intravenous  PONV Risk Score and Plan: 1 and Propofol infusion  Airway Management Planned: Nasal Cannula and Natural Airway  Additional Equipment:   Intra-op Plan:   Post-operative Plan:   Informed Consent: I have reviewed the patients History and Physical, chart, labs and discussed the procedure including the risks, benefits and alternatives for the proposed anesthesia with the patient or authorized representative who has indicated his/her understanding and acceptance.     Dental advisory given  Plan Discussed with: CRNA and Surgeon  Anesthesia Plan Comments:         Anesthesia Quick Evaluation  

## 2023-02-02 NOTE — Anesthesia Preprocedure Evaluation (Signed)
Anesthesia Evaluation  Patient identified by MRN, date of birth, ID band Patient awake    Reviewed: Allergy & Precautions, H&P , NPO status , Patient's Chart, lab work & pertinent test results  Airway Mallampati: II  TM Distance: >3 FB Neck ROM: Full    Dental no notable dental hx. (+) Dental Advisory Given, Missing   Pulmonary neg pulmonary ROS, former smoker   Pulmonary exam normal breath sounds clear to auscultation       Cardiovascular Exercise Tolerance: Good hypertension, Pt. on medications Normal cardiovascular exam Rhythm:Regular Rate:Normal     Neuro/Psych negative neurological ROS  negative psych ROS   GI/Hepatic Neg liver ROS,GERD  Medicated and Controlled,,  Endo/Other  diabetes, Well Controlled, Type 2, Oral Hypoglycemic Agents    Renal/GU negative Renal ROS  negative genitourinary   Musculoskeletal negative musculoskeletal ROS (+)    Abdominal   Peds negative pediatric ROS (+)  Hematology  (+) Blood dyscrasia, anemia   Anesthesia Other Findings   Reproductive/Obstetrics negative OB ROS                             Anesthesia Physical Anesthesia Plan  ASA: 2  Anesthesia Plan: General   Post-op Pain Management: Minimal or no pain anticipated   Induction: Intravenous  PONV Risk Score and Plan: 1 and Propofol infusion  Airway Management Planned: Nasal Cannula and Natural Airway  Additional Equipment:   Intra-op Plan:   Post-operative Plan:   Informed Consent: I have reviewed the patients History and Physical, chart, labs and discussed the procedure including the risks, benefits and alternatives for the proposed anesthesia with the patient or authorized representative who has indicated his/her understanding and acceptance.     Dental advisory given  Plan Discussed with: CRNA and Surgeon  Anesthesia Plan Comments:         Anesthesia Quick Evaluation

## 2023-02-02 NOTE — Interval H&P Note (Signed)
History and Physical Interval Note:  02/02/2023 1:55 PM  Ricardo Ward  has presented today for surgery, with the diagnosis of GERD, Dysphagia.  The various methods of treatment have been discussed with the patient and family. After consideration of risks, benefits and other options for treatment, the patient has consented to  Procedure(s): ESOPHAGOGASTRODUODENOSCOPY (EGD) WITH PROPOFOL (N/A) ESOPHAGEAL DILATION (N/A) as a surgical intervention.  The patient's history has been reviewed, patient examined, no change in status, stable for surgery.  I have reviewed the patient's chart and labs.  Questions were answered to the patient's satisfaction.     [name]

## 2023-02-02 NOTE — Discharge Instructions (Addendum)
You are being discharged to home.  Resume your previous diet.  We are waiting for your pathology results.  Take Prilosec (omeprazole) 40 mg by mouth twice a day.  Use Prilosec (omeprazole) 40 mg PO BID. Take the medication at least 30 minutes prior to breakfast/supper. Stop pantoprazole. TIF brochure provided.

## 2023-02-02 NOTE — Anesthesia Postprocedure Evaluation (Signed)
Anesthesia Post Note  Patient: Ricardo Ward  Procedure(s) Performed: ESOPHAGOGASTRODUODENOSCOPY (EGD) WITH PROPOFOL ESOPHAGEAL DILATION  Patient location during evaluation: Phase II Anesthesia Type: General Level of consciousness: awake and alert and oriented Pain management: pain level controlled Vital Signs Assessment: post-procedure vital signs reviewed and stable Respiratory status: spontaneous breathing, nonlabored ventilation and respiratory function stable Cardiovascular status: blood pressure returned to baseline and stable Postop Assessment: no apparent nausea or vomiting Anesthetic complications: no  No notable events documented.   Last Vitals:  Vitals:   02/02/23 1300 02/02/23 1433  BP: 104/64 (!) 87/46  Pulse: 61 72  Resp: 14   Temp:  36.4 C  SpO2: 100% 94%    Last Pain:  Vitals:   02/02/23 1433  TempSrc: Oral  PainSc: 0-No pain                 Shaunta Oncale C Jolynn Bajorek

## 2023-02-02 NOTE — Telephone Encounter (Signed)
Hi Mitzie,  Can you please schedule a follow up appointment for this patient in 2 months with Dr. Marletta Lor or any of the APPs?  Thanks,  Katrinka Blazing, MD Gastroenterology and Hepatology [facility] Gastroenterology

## 2023-02-04 NOTE — Telephone Encounter (Signed)
Routing to Ricardo Ward/Ricardo Ward to arrange follow-up.

## 2023-02-05 NOTE — Brief Op Note (Signed)
02/02/2023  4:51 PM  PATIENT:  Ricardo Ward  52 y.o. male  PRE-OPERATIVE DIAGNOSIS:  GERD, Dysphagia  POST-OPERATIVE DIAGNOSIS:  2cm hernia , esophagitis  PROCEDURE:  Procedure(s): ESOPHAGOGASTRODUODENOSCOPY (EGD) WITH PROPOFOL (N/A) ESOPHAGEAL DILATION (N/A)  SURGEON:  Surgeon(s) and Role:    * Dolores Frame, MD - Primary  Patient underwent EGD under propofol sedation.  Tolerated the procedure adequately.   FINDINGS: - LA Grade A esophagitis with no bleeding.  - Non- obstructing Schatzki ring. Dilated.  - 2 cm hiatal hernia.  - Normal stomach.  - Normal examined duodenum.  RECOMMENDATIONS - Discharge patient to home ( ambulatory) . - Resume previous diet. - Await pathology results. - Use Prilosec ( omeprazole) 40 mg PO BID. Take the medication at least 30 minutes prior to breakfast/ supper. - Stop pantoprazole. - TIF brochure provided.  Katrinka Blazing, MD Gastroenterology and Hepatology [facility] Gastroenterology

## 2023-02-06 NOTE — Op Note (Signed)
[facility] Patient Name: Ricardo Ward Procedure Date: 02/02/2023 1:58 PM MRN: [id number] Date of Birth: [birth year] Attending MD: Katrinka Blazing , , [id number] CSN: [id number] Age: 52 Admit Type: Outpatient Procedure:                Upper GI endoscopy Indications:              Dysphagia, Heartburn Providers:                Katrinka Blazing, Jannett Celestine, RN, Pandora Leiter,                            Technician Referring MD:              Medicines:                Monitored Anesthesia Care Complications:            No immediate complications. Estimated Blood Loss:     Estimated blood loss: none. Procedure:                Pre-Anesthesia Assessment:                           - Prior to the procedure, a History and Physical                            was performed, and patient medications, allergies                            and sensitivities were reviewed. The patient's                            tolerance of previous anesthesia was reviewed.                           - The risks and benefits of the procedure and the                            sedation options and risks were discussed with the                            patient. All questions were answered and informed                            consent was obtained.                           - ASA Grade Assessment: I - A normal, healthy                            patient.                           After obtaining informed consent, the endoscope was                            passed under direct vision. Throughout the  procedure, the patient's blood pressure, pulse, and                            oxygen saturations were monitored continuously. The                            [id number]-H190 ([id number]) scope was introduced through the                            mouth, and advanced to the second part of duodenum.                            The upper GI endoscopy was accomplished without                             difficulty. The patient tolerated the procedure                            well. Scope In: 2:19:43 PM Scope Out: 2:29:59 PM Total Procedure Duration: 0 hours 10 minutes 16 seconds  Findings:      LA Grade A (one or more mucosal breaks less than 5 mm, not extending       between tops of 2 mucosal folds) esophagitis with no bleeding was found       at the gastroesophageal junction.      A non-obstructing Schatzki ring was found at the gastroesophageal       junction. A cold forceps was used to disrupt the rim of the ring. A TTS       dilator was passed through the scope. Dilation with a 15-16.5-18 mm       balloon dilator was performed to 18 mm. The dilation site was examined       and showed mild mucosal disruption.      A 2 cm hiatal hernia was present.      The gastroesophageal flap valve was visualized endoscopically and       classified as Hill Grade IV (no fold, wide open lumen, hiatal hernia       present).      The stomach was normal.      The examined duodenum was normal. Impression:               - LA Grade A esophagitis with no bleeding.                           - Non-obstructing Schatzki ring. Dilated.                           - 2 cm hiatal hernia.                           - Normal stomach.                           - Normal examined duodenum.                           - No specimens collected. Moderate Sedation:  Per Anesthesia Care Recommendation:           - Discharge patient to home (ambulatory).                           - Resume previous diet.                           - Await pathology results.                           - Use Prilosec (omeprazole) 40 mg PO BID. Take the                            medication at least 30 minutes prior to                            breakfast/supper.                           - Stop pantoprazole.                           - TIF brochure provided. Procedure Code(s):        --- Professional ---                           [phone],  Esophagogastroduodenoscopy, flexible,                            transoral; with transendoscopic balloon dilation of                            esophagus (less than 30 mm diameter) Diagnosis Code(s):        --- Professional ---                           K20.90, Esophagitis, unspecified without bleeding                           K22.2, Esophageal obstruction                           K44.9, Diaphragmatic hernia without obstruction or                            gangrene                           R13.10, Dysphagia, unspecified                           R12, Heartburn CPT copyright 2022 American [employer] Association. All rights reserved. The codes documented in this report are preliminary and upon coder review may  be revised to meet current compliance requirements. Katrinka Blazing, MD Katrinka Blazing,  02/02/2023 2:42:54 PM This report has been signed electronically. Number of Addenda: 0

## 2023-02-08 ENCOUNTER — Encounter (HOSPITAL_COMMUNITY): Payer: Self-pay | Admitting: Gastroenterology

## 2023-02-13 ENCOUNTER — Other Ambulatory Visit: Payer: Self-pay

## 2023-02-13 ENCOUNTER — Other Ambulatory Visit (HOSPITAL_COMMUNITY): Payer: Self-pay

## 2023-02-15 ENCOUNTER — Other Ambulatory Visit (HOSPITAL_COMMUNITY): Payer: Self-pay

## 2023-03-05 ENCOUNTER — Encounter: Payer: Self-pay | Admitting: Internal Medicine

## 2023-04-11 ENCOUNTER — Other Ambulatory Visit (HOSPITAL_COMMUNITY): Payer: Self-pay

## 2023-05-31 ENCOUNTER — Other Ambulatory Visit (HOSPITAL_COMMUNITY): Payer: Self-pay

## 2023-05-31 ENCOUNTER — Other Ambulatory Visit: Payer: Self-pay | Admitting: Gastroenterology

## 2023-06-01 ENCOUNTER — Other Ambulatory Visit: Payer: Self-pay | Admitting: Gastroenterology

## 2023-06-01 ENCOUNTER — Other Ambulatory Visit (HOSPITAL_COMMUNITY): Payer: Self-pay

## 2023-06-01 DIAGNOSIS — K219 Gastro-esophageal reflux disease without esophagitis: Secondary | ICD-10-CM

## 2023-06-01 MED ORDER — OMEPRAZOLE 40 MG PO CPDR
40.0000 mg | DELAYED_RELEASE_CAPSULE | Freq: Two times a day (BID) | ORAL | 1 refills | Status: DC
Start: 2023-06-01 — End: 2024-02-19
  Filled 2023-06-01 – 2023-07-03 (×2): qty 180, 90d supply, fill #0
  Filled 2023-08-23: qty 180, 90d supply, fill #1
  Filled 2024-01-10: qty 60, 30d supply, fill #1

## 2023-06-01 NOTE — Telephone Encounter (Signed)
Ricardo Ward seen this pt last

## 2023-06-01 NOTE — Telephone Encounter (Signed)
Rx sent 

## 2023-07-04 ENCOUNTER — Other Ambulatory Visit (HOSPITAL_COMMUNITY): Payer: Self-pay

## 2023-07-06 ENCOUNTER — Other Ambulatory Visit (HOSPITAL_COMMUNITY): Payer: Self-pay

## 2023-08-23 ENCOUNTER — Other Ambulatory Visit: Payer: Self-pay

## 2023-08-23 ENCOUNTER — Other Ambulatory Visit (HOSPITAL_COMMUNITY): Payer: Self-pay

## 2023-08-24 ENCOUNTER — Other Ambulatory Visit (HOSPITAL_COMMUNITY): Payer: Self-pay

## 2023-10-11 ENCOUNTER — Other Ambulatory Visit (HOSPITAL_COMMUNITY): Payer: Self-pay

## 2023-10-16 ENCOUNTER — Other Ambulatory Visit (HOSPITAL_COMMUNITY): Payer: Self-pay

## 2023-11-27 ENCOUNTER — Other Ambulatory Visit (HOSPITAL_COMMUNITY): Payer: Self-pay

## 2024-01-11 ENCOUNTER — Other Ambulatory Visit (HOSPITAL_COMMUNITY): Payer: Self-pay

## 2024-02-06 ENCOUNTER — Other Ambulatory Visit: Payer: Self-pay | Admitting: Student

## 2024-02-06 DIAGNOSIS — E1169 Type 2 diabetes mellitus with other specified complication: Secondary | ICD-10-CM

## 2024-02-06 DIAGNOSIS — I152 Hypertension secondary to endocrine disorders: Secondary | ICD-10-CM

## 2024-02-06 DIAGNOSIS — E119 Type 2 diabetes mellitus without complications: Secondary | ICD-10-CM

## 2024-02-07 ENCOUNTER — Other Ambulatory Visit (HOSPITAL_COMMUNITY): Payer: Self-pay

## 2024-02-07 MED ORDER — ATORVASTATIN CALCIUM 20 MG PO TABS
20.0000 mg | ORAL_TABLET | Freq: Every day | ORAL | 3 refills | Status: DC
Start: 2024-02-07 — End: 2024-02-19
  Filled 2024-02-07: qty 30, 30d supply, fill #0

## 2024-02-07 MED ORDER — METFORMIN HCL 500 MG PO TABS
500.0000 mg | ORAL_TABLET | Freq: Every day | ORAL | 3 refills | Status: DC
Start: 2024-02-07 — End: 2024-02-19
  Filled 2024-02-07: qty 30, 30d supply, fill #0

## 2024-02-07 MED ORDER — LISINOPRIL-HYDROCHLOROTHIAZIDE 20-25 MG PO TABS
1.0000 | ORAL_TABLET | Freq: Every day | ORAL | 3 refills | Status: DC
Start: 2024-02-07 — End: 2024-02-19
  Filled 2024-02-07: qty 30, 30d supply, fill #0

## 2024-02-07 NOTE — Telephone Encounter (Signed)
 Medication sent to pharmacy

## 2024-02-19 ENCOUNTER — Other Ambulatory Visit (HOSPITAL_COMMUNITY): Payer: Self-pay

## 2024-02-19 ENCOUNTER — Ambulatory Visit: Payer: Self-pay | Admitting: Student

## 2024-02-19 VITALS — BP 108/69 | HR 79 | Temp 98.5°F | Ht 66.0 in | Wt 208.6 lb

## 2024-02-19 DIAGNOSIS — Z0000 Encounter for general adult medical examination without abnormal findings: Secondary | ICD-10-CM

## 2024-02-19 DIAGNOSIS — Z7984 Long term (current) use of oral hypoglycemic drugs: Secondary | ICD-10-CM

## 2024-02-19 DIAGNOSIS — I152 Hypertension secondary to endocrine disorders: Secondary | ICD-10-CM

## 2024-02-19 DIAGNOSIS — E1159 Type 2 diabetes mellitus with other circulatory complications: Secondary | ICD-10-CM

## 2024-02-19 DIAGNOSIS — E785 Hyperlipidemia, unspecified: Secondary | ICD-10-CM

## 2024-02-19 DIAGNOSIS — K219 Gastro-esophageal reflux disease without esophagitis: Secondary | ICD-10-CM

## 2024-02-19 DIAGNOSIS — E1169 Type 2 diabetes mellitus with other specified complication: Secondary | ICD-10-CM

## 2024-02-19 DIAGNOSIS — E119 Type 2 diabetes mellitus without complications: Secondary | ICD-10-CM

## 2024-02-19 LAB — POCT GLYCOSYLATED HEMOGLOBIN (HGB A1C): Hemoglobin A1C: 6.3 % — AB (ref 4.0–5.6)

## 2024-02-19 LAB — GLUCOSE, CAPILLARY: Glucose-Capillary: 126 mg/dL — ABNORMAL HIGH (ref 70–99)

## 2024-02-19 MED ORDER — ATORVASTATIN CALCIUM 20 MG PO TABS
20.0000 mg | ORAL_TABLET | Freq: Every day | ORAL | 3 refills | Status: AC
Start: 2024-02-19 — End: ?
  Filled 2024-02-19 – 2024-03-21 (×2): qty 90, 90d supply, fill #0
  Filled 2024-07-07: qty 90, 90d supply, fill #1

## 2024-02-19 MED ORDER — LISINOPRIL-HYDROCHLOROTHIAZIDE 20-25 MG PO TABS
1.0000 | ORAL_TABLET | Freq: Every day | ORAL | 3 refills | Status: AC
Start: 2024-02-19 — End: ?
  Filled 2024-02-19 – 2024-03-21 (×2): qty 90, 90d supply, fill #0
  Filled 2024-07-07: qty 90, 90d supply, fill #1

## 2024-02-19 MED ORDER — OMEPRAZOLE 40 MG PO CPDR
40.0000 mg | DELAYED_RELEASE_CAPSULE | Freq: Two times a day (BID) | ORAL | 2 refills | Status: AC
Start: 2024-02-19 — End: ?
  Filled 2024-02-19: qty 60, 30d supply, fill #0
  Filled 2024-07-07: qty 60, 30d supply, fill #1

## 2024-02-19 MED ORDER — METFORMIN HCL 500 MG PO TABS
500.0000 mg | ORAL_TABLET | Freq: Every day | ORAL | 3 refills | Status: AC
Start: 2024-02-19 — End: ?
  Filled 2024-02-19 – 2024-03-21 (×2): qty 90, 90d supply, fill #0
  Filled 2024-07-07: qty 90, 90d supply, fill #1

## 2024-02-19 NOTE — Patient Instructions (Addendum)
 Ricardo Ward, Ricardo Ward, por permitirnos atenderle [employer]. Hoy hablamos sobre:  Para su diabetes: - Contine tomando metformina 500 mg, 1 tableta oral al da.  Para su presin arterial: - Contine tomando lisinopril -HCTZ, 1 tableta oral al da.  Para su colesterol: - Contine tomando Lipitor 20 mg al da.  Para su reflujo cido: - Contine tomando omeprazol 40 mg, 1 tableta oral 2 veces al da.  Le estoy entregando la documentacin para obtener asistencia con su seguro. Por favor, asegrese de completarla y Personal assistant a Eric para Arboriculturist.  I have ordered the following labs for you:   Lab Orders         Glucose, capillary         Microalbumin / Creatinine Urine Ratio         POC Hbg A1C      Tests ordered today:  As above  Referrals ordered today:   Referral Orders  No referral(s) requested today     I have ordered the following medication/changed the following medications:   Stop the following medications: Medications Discontinued During This Encounter  Medication Reason   omeprazole  (PRILOSEC) 40 MG capsule Reorder   metFORMIN  (GLUCOPHAGE ) 500 MG tablet Reorder   lisinopril -hydrochlorothiazide  (ZESTORETIC ) 20-25 MG tablet Reorder   atorvastatin  (LIPITOR) 20 MG tablet Reorder     Start the following medications: Meds ordered this encounter  Medications   atorvastatin  (LIPITOR) 20 MG tablet    Sig: Take 1 tablet (20 mg total) by mouth daily.    Dispense:  90 tablet    Refill:  3    IM program   lisinopril -hydrochlorothiazide  (ZESTORETIC ) 20-25 MG tablet    Sig: Take 1 tablet by mouth daily.    Dispense:  90 tablet    Refill:  3    IM program   metFORMIN  (GLUCOPHAGE ) 500 MG tablet    Sig: Take 1 tablet (500 mg total) by mouth daily with breakfast.    Dispense:  90 tablet    Refill:  3    IM program   omeprazole  (PRILOSEC) 40 MG capsule    Sig: Take 1 capsule (40 mg total) by mouth 2 (two) times daily.    Dispense:  180 capsule    Refill:  2      Follow up: 1 year blood pressure and diabetes   Remember:   Should you have any questions or concerns please call the internal medicine clinic at [phone].     Rayann Atway, D.O. [facility]

## 2024-02-19 NOTE — Progress Notes (Unsigned)
 Established Patient Office Visit  Subjective   Patient ID: Ricardo Ward, male    DOB: 05-23-1971  Age: 53 y.o. MRN: [id number]  Chief Complaint  Patient presents with   Medical Management of Chronic Issues   Medication Refill    HPI This is a 53 year old male with past medical history of hypertension, diabetes, hyperlipidemia, GERD who presents today for follow-up on diabetes.  Last office visit 01/11/2023.  Past Medical History:  Diagnosis Date   COVID-19 12/2018   s/p hospitalization and recovery   Diabetes (HCC)    GERD (gastroesophageal reflux disease)    HLD (hyperlipidemia)    HTN (hypertension)    Pterygium    rt eye   Pterygium of both eyes    removal    ROS   As per assessment and plan Objective:     BP 108/69 (BP Location: Left Arm, Patient Position: Sitting, Cuff Size: Normal)   Pulse 79   Temp 98.5 F (36.9 C) (Oral)   Ht 5' 6 (1.676 m)   Wt 208 lb 9.6 oz (94.6 kg)   SpO2 93%   BMI 33.67 kg/m  BP Readings from Last 3 Encounters:  02/19/24 108/69  02/02/23 (!) 87/46  02/01/23 136/80   Wt Readings from Last 3 Encounters:  02/19/24 208 lb 9.6 oz (94.6 kg)  02/01/23 218 lb 9.6 oz (99.2 kg)  01/11/23 218 lb 9.6 oz (99.2 kg)   SpO2 Readings from Last 3 Encounters:  02/19/24 93%  02/02/23 94%  02/01/23 98%      Physical Exam  General: Sitting in chair, no acute distress Eyes: Left eye lacrimation and mild conjunctival redness Cardiovascular: Regular rate Pulmonary: Breathing comfortably, no wheezing or crackles Abdomen: Soft, nontender, nondistended MSK: No lower extremity edema bilaterally  Results for orders placed or performed in visit on 02/19/24  Glucose, capillary  Result Value Ref Range   Glucose-Capillary 126 (H) 70 - 99 mg/dL  POC Hbg J8R  Result Value Ref Range   Hemoglobin A1C 6.3 (A) 4.0 - 5.6 %   HbA1c POC (<> result, manual entry)     HbA1c, POC (prediabetic range)     HbA1c, POC (controlled diabetic range)       Last metabolic panel Lab Results  Component Value Date   GLUCOSE 109 (H) 01/11/2023   NA 143 01/11/2023   K 3.8 01/11/2023   CL 107 (H) 01/11/2023   CO2 20 01/11/2023   BUN 16 01/11/2023   CREATININE 0.81 01/11/2023   EGFR 105 01/11/2023   CALCIUM  9.7 01/11/2023   PHOS 4.2 01/29/2019   PROT 6.9 05/02/2019   ALBUMIN 4.6 05/02/2019   LABGLOB 2.7 12/28/2016   AGRATIO 1.8 12/28/2016   BILITOT 0.2 05/02/2019   ALKPHOS 78 05/02/2019   AST 23 05/02/2019   ALT 21 05/02/2019   ANIONGAP 5 01/25/2022   Last lipids Lab Results  Component Value Date   CHOL 143 10/13/2020   HDL 45 10/13/2020   LDLCALC 69 10/13/2020   TRIG 172 (H) 10/13/2020   CHOLHDL 3.2 10/13/2020   Last hemoglobin A1c Lab Results  Component Value Date   HGBA1C 6.3 (A) 02/19/2024      The ASCVD Risk score (Arnett DK, et al., 2019) failed to calculate for the following reasons:   Cannot find a previous HDL lab   Cannot find a previous total cholesterol lab    Assessment & Plan:  Discussed with Dr Lovie   Problem List Items Addressed This Visit  Cardiovascular and Mediastinum   Hypertension associated with type 2 diabetes mellitus (HCC) (Chronic)   BP Readings from Last 3 Encounters:  02/19/24 108/69  02/02/23 (!) 87/46  02/01/23 136/80   Outpatient medication regimen consist of lisinopril -HCTZ 20-25 mg daily.  Patient denies any chest pain, shortness of breath, lower extremity swelling or dizziness.  Patient is due for BMP today, however he does not have any coverage at this point.  BMP checked last year was unremarkable.  Patient reports that he will apply for the patient assistance program and once he has coverage, he will come back for further tests.  Refills are sent to the pharmacy. -Continue lisinopril -HCTZ daily -Will consider doing BMP at the next office visit once patient has coverage      Relevant Medications   atorvastatin  (LIPITOR) 20 MG tablet   lisinopril -hydrochlorothiazide   (ZESTORETIC ) 20-25 MG tablet   metFORMIN  (GLUCOPHAGE ) 500 MG tablet     Digestive   Gastroesophageal reflux disease   EGD 6/14 that showed LA Grade A esophagitis with no bleeding. - Non- obstructing Schatzki ring. Dilated. - 2 cm hiatal hernia.  Denies any symptoms at this point. - Takes Prilosec 40 mg BID, refill sent to the pharmacy      Relevant Medications   omeprazole  (PRILOSEC) 40 MG capsule     Endocrine   Type 2 diabetes mellitus (HCC) - Primary (Chronic)   Lab Results  Component Value Date   HGBA1C 6.3 (A) 02/19/2024   A1c checked in office today, 6.3, unchanged from last year.  Patient is taking metformin  500 mg daily, compliant.  Denies any dizzy spells or lightheadedness.  Patient is due for an ophthalmology referral, reports that he has had bilateral cataract surgery over 4 years ago, and then for the past 6 months he has been having teary of his left eye.  However currently he does not have coverage, patient was advised to fill out the patient assistance paperwork and handed back to [place].  Once he has coverage, we will go ahead and send referrals to ophthalmology for an eye check. -Urine ACR today - Foot exam today, pulses present bilaterally -Continue metformin  500 mg daily -Once patient has coverage, patient is due for a BMP      Relevant Medications   atorvastatin  (LIPITOR) 20 MG tablet   lisinopril -hydrochlorothiazide  (ZESTORETIC ) 20-25 MG tablet   metFORMIN  (GLUCOPHAGE ) 500 MG tablet   Other Relevant Orders   POC Hbg A1C (Completed)   Microalbumin / Creatinine Urine Ratio   Hyperlipidemia associated with type 2 diabetes mellitus (HCC) (Chronic)   Lab Results  Component Value Date   LDLCALC 69 10/13/2020   Last LDL checked 09/2020, patient does not have coverage at this point.  Patient is taking Lipitor 20 mg daily. -Continue Lipitor 20 mg daily -Will check LDL once patient has coverage      Relevant Medications   atorvastatin  (LIPITOR) 20 MG tablet    lisinopril -hydrochlorothiazide  (ZESTORETIC ) 20-25 MG tablet   metFORMIN  (GLUCOPHAGE ) 500 MG tablet     Other   Health maintenance examination   Patient is due for vaccines and hepatitis C screening, will withhold these screening exams until patient has coverage.      Other Visit Diagnoses       Controlled type 2 diabetes mellitus without complication, without long-term current use of insulin  (HCC)       Relevant Medications   atorvastatin  (LIPITOR) 20 MG tablet   lisinopril -hydrochlorothiazide  (ZESTORETIC ) 20-25 MG tablet   metFORMIN  (GLUCOPHAGE ) 500  MG tablet       No follow-ups on file.    Toma Edwards, DO

## 2024-02-20 DIAGNOSIS — Z0000 Encounter for general adult medical examination without abnormal findings: Secondary | ICD-10-CM | POA: Insufficient documentation

## 2024-02-20 LAB — MICROALBUMIN / CREATININE URINE RATIO
Creatinine, Urine: 452.7 mg/dL
Microalb/Creat Ratio: 14 mg/g{creat} (ref 0–29)
Microalbumin, Urine: 65.5 ug/mL

## 2024-02-20 NOTE — Assessment & Plan Note (Addendum)
 EGD 6/14 that showed LA Grade A esophagitis with no bleeding. - Non- obstructing Schatzki ring. Dilated. - 2 cm hiatal hernia.  Denies any symptoms at this point. - Takes Prilosec 40 mg BID, refill sent to the pharmacy

## 2024-02-20 NOTE — Assessment & Plan Note (Signed)
 Lab Results  Component Value Date   HGBA1C 6.3 (A) 02/19/2024   A1c checked in office today, 6.3, unchanged from last year.  Patient is taking metformin  500 mg daily, compliant.  Denies any dizzy spells or lightheadedness.  Patient is due for an ophthalmology referral, reports that he has had bilateral cataract surgery over 4 years ago, and then for the past 6 months he has been having teary of his left eye.  However currently he does not have coverage, patient was advised to fill out the patient assistance paperwork and handed back to [place].  Once he has coverage, we will go ahead and send referrals to ophthalmology for an eye check. -Urine ACR today - Foot exam today, pulses present bilaterally -Continue metformin  500 mg daily -Once patient has coverage, patient is due for a BMP

## 2024-02-20 NOTE — Assessment & Plan Note (Signed)
 BP Readings from Last 3 Encounters:  02/19/24 108/69  02/02/23 (!) 87/46  02/01/23 136/80   Outpatient medication regimen consist of lisinopril -HCTZ 20-25 mg daily.  Patient denies any chest pain, shortness of breath, lower extremity swelling or dizziness.  Patient is due for BMP today, however he does not have any coverage at this point.  BMP checked last year was unremarkable.  Patient reports that he will apply for the patient assistance program and once he has coverage, he will come back for further tests.  Refills are sent to the pharmacy. -Continue lisinopril -HCTZ daily -Will consider doing BMP at the next office visit once patient has coverage

## 2024-02-20 NOTE — Assessment & Plan Note (Signed)
 Patient is due for vaccines and hepatitis C screening, will withhold these screening exams until patient has coverage.

## 2024-02-20 NOTE — Assessment & Plan Note (Signed)
 Lab Results  Component Value Date   LDLCALC 69 10/13/2020   Last LDL checked 09/2020, patient does not have coverage at this point.  Patient is taking Lipitor 20 mg daily. -Continue Lipitor 20 mg daily -Will check LDL once patient has coverage

## 2024-02-20 NOTE — Progress Notes (Signed)
 Internal Medicine Clinic Attending  Case discussed with the resident at the time of the visit.  We reviewed the resident's history and exam and pertinent patient test results.  I agree with the assessment, diagnosis, and plan of care documented in the resident's note.   We are working to get patient financial assistance, which is the biggest barrier to his care right now

## 2024-02-21 ENCOUNTER — Ambulatory Visit: Payer: Self-pay | Admitting: Student

## 2024-03-21 ENCOUNTER — Other Ambulatory Visit (HOSPITAL_COMMUNITY): Payer: Self-pay

## 2024-03-21 ENCOUNTER — Other Ambulatory Visit: Payer: Self-pay

## 2024-03-24 ENCOUNTER — Other Ambulatory Visit (HOSPITAL_COMMUNITY): Payer: Self-pay

## 2024-06-04 ENCOUNTER — Encounter (INDEPENDENT_AMBULATORY_CARE_PROVIDER_SITE_OTHER): Payer: Self-pay | Admitting: Gastroenterology

## 2024-07-08 ENCOUNTER — Other Ambulatory Visit (HOSPITAL_COMMUNITY): Payer: Self-pay

## 2024-07-22 ENCOUNTER — Encounter (INDEPENDENT_AMBULATORY_CARE_PROVIDER_SITE_OTHER): Payer: Self-pay | Admitting: *Deleted
